# Patient Record
Sex: Female | Born: 1950 | Race: White | Hispanic: No | Marital: Married | Smoking: Never smoker
Health system: Southern US, Community
[De-identification: ages and names within clinical notes are randomized; demographics above are authoritative.]

## PROBLEM LIST (undated history)

## (undated) DIAGNOSIS — Z8371 Family history of colonic polyps: Secondary | ICD-10-CM

## (undated) DIAGNOSIS — Z800 Family history of malignant neoplasm of digestive organs: Secondary | ICD-10-CM

## (undated) DIAGNOSIS — I10 Essential (primary) hypertension: Secondary | ICD-10-CM

## (undated) DIAGNOSIS — R112 Nausea with vomiting, unspecified: Secondary | ICD-10-CM

## (undated) DIAGNOSIS — Z83719 Family history of colon polyps, unspecified: Secondary | ICD-10-CM

## (undated) DIAGNOSIS — M1990 Unspecified osteoarthritis, unspecified site: Secondary | ICD-10-CM

## (undated) DIAGNOSIS — Z87440 Personal history of urinary (tract) infections: Secondary | ICD-10-CM

## (undated) DIAGNOSIS — K589 Irritable bowel syndrome without diarrhea: Secondary | ICD-10-CM

## (undated) DIAGNOSIS — K5790 Diverticulosis of intestine, part unspecified, without perforation or abscess without bleeding: Secondary | ICD-10-CM

## (undated) DIAGNOSIS — Z9889 Other specified postprocedural states: Secondary | ICD-10-CM

## (undated) DIAGNOSIS — E7800 Pure hypercholesterolemia, unspecified: Secondary | ICD-10-CM

## (undated) DIAGNOSIS — R2232 Localized swelling, mass and lump, left upper limb: Secondary | ICD-10-CM

## (undated) HISTORY — PX: WEDGE RESECTION: SHX5070

## (undated) HISTORY — PX: FINGER GANGLION CYST EXCISION: SHX1636

## (undated) HISTORY — DX: Family history of colonic polyps: Z83.71

## (undated) HISTORY — DX: Family history of malignant neoplasm of digestive organs: Z80.0

## (undated) HISTORY — PX: APPENDECTOMY: SHX54

## (undated) HISTORY — DX: Pure hypercholesterolemia, unspecified: E78.00

## (undated) HISTORY — PX: CYST EXCISION: SHX5701

## (undated) HISTORY — PX: ABDOMINAL HYSTERECTOMY: SHX81

## (undated) HISTORY — PX: COLONOSCOPY: SHX174

## (undated) HISTORY — PX: TONSILLECTOMY: SUR1361

## (undated) HISTORY — DX: Family history of colon polyps, unspecified: Z83.719

## (undated) HISTORY — PX: OTHER SURGICAL HISTORY: SHX169

## (undated) HISTORY — PX: LIPOSUCTION: SHX10

## (undated) HISTORY — PX: WISDOM TOOTH EXTRACTION: SHX21

## (undated) SURGERY — EXCISION, LESION, HAND
Anesthesia: General | Laterality: Left

## (undated) SURGERY — BREAST REDUCTION WITH LIPOSUCTION
Anesthesia: General | Laterality: Bilateral

---

## 1998-11-24 ENCOUNTER — Ambulatory Visit (HOSPITAL_COMMUNITY): Admission: RE | Admit: 1998-11-24 | Discharge: 1998-11-24 | Payer: Self-pay | Admitting: Specialist

## 1998-12-06 ENCOUNTER — Ambulatory Visit (HOSPITAL_BASED_OUTPATIENT_CLINIC_OR_DEPARTMENT_OTHER): Admission: RE | Admit: 1998-12-06 | Discharge: 1998-12-06 | Payer: Self-pay | Admitting: Specialist

## 2000-05-27 ENCOUNTER — Other Ambulatory Visit: Admission: RE | Admit: 2000-05-27 | Discharge: 2000-05-27 | Payer: Self-pay | Admitting: Gynecology

## 2003-04-08 ENCOUNTER — Encounter: Payer: Self-pay | Admitting: Specialist

## 2003-04-08 ENCOUNTER — Encounter: Admission: RE | Admit: 2003-04-08 | Discharge: 2003-04-08 | Payer: Self-pay | Admitting: Specialist

## 2003-06-14 ENCOUNTER — Other Ambulatory Visit: Admission: RE | Admit: 2003-06-14 | Discharge: 2003-06-14 | Payer: Self-pay | Admitting: Gynecology

## 2008-05-28 ENCOUNTER — Ambulatory Visit (HOSPITAL_BASED_OUTPATIENT_CLINIC_OR_DEPARTMENT_OTHER): Admission: RE | Admit: 2008-05-28 | Discharge: 2008-05-28 | Payer: Self-pay | Admitting: Specialist

## 2008-05-28 HISTORY — PX: KNEE ARTHROSCOPY: SHX127

## 2008-08-05 ENCOUNTER — Ambulatory Visit: Payer: Self-pay | Admitting: Vascular Surgery

## 2008-08-05 ENCOUNTER — Ambulatory Visit (HOSPITAL_COMMUNITY): Admission: RE | Admit: 2008-08-05 | Discharge: 2008-08-05 | Payer: Self-pay | Admitting: Specialist

## 2008-08-05 ENCOUNTER — Encounter (INDEPENDENT_AMBULATORY_CARE_PROVIDER_SITE_OTHER): Payer: Self-pay | Admitting: Specialist

## 2011-01-30 NOTE — Op Note (Signed)
Laura Hahn, Laura Hahn              ACCOUNT NO.:  [id number]   MEDICAL RECORD NO.:  [id number]          PATIENT TYPE:  AMB   LOCATION:  [facility]                         FACILITY:  [facility]   PHYSICIAN:  Jene Every, M.D.    DATE OF BIRTH:  1951/09/13   DATE OF PROCEDURE:  05/28/2008  DATE OF DISCHARGE:                               OPERATIVE REPORT   PREOPERATIVE DIAGNOSIS:  Medial meniscus tear, degenerative joint  disease right knee.   POSTOPERATIVE DIAGNOSIS:  Medial meniscus tear, degenerative joint  disease right knee, lateral meniscus tear, grade III chondromalacia  medial femoral condyle, chondral flap tear, grade III chondromalacia of  patellar.   PROCEDURE:  Right knee arthroscopy, partial medial and lateral  meniscectomy, chondroplasty of patella, medial and femoral condyle,  tibial plateau.   ANESTHESIA:  General.   BRIEF HISTORY:  A 60 year old with locking, popping and giving way.  Medial meniscus tear was suspected on MRI and she is indicated for  arthroscopic surgery.  Risks and benefits were discussed including  bleeding, infection, no change in symptoms, worsening of symptoms, need  for repeat debridement, anesthetic complications, etc.   TECHNIQUE:  With the patient in supine position after induction of  adequate general anesthesia, 1 gram of Kefzol, the right lower extremity  was prepped and draped in the usual sterile fashion.  A lateral  parapatellar portal and superomedial parapatellar portal was fashioned  with a #11 blade.  Ingress cannula atraumatically placed.  Irrigant was  utilized to insufflate the joint.  Under direct visualization a medial  parapatellar portal was fashioned with a #11 blade after localization  with an 18 gauge needle sparing the medial meniscus.  Noted was a  chondral flap tear and extensive grade III changes of the femoral  condyle and a posterior horn medial meniscus tear and some grade III  change of the tibial plateau.  Basket  rongeurs was introduced to utilize  a partial medial meniscectomy of the posterior third to a stable base  and further contoured with a 4.2 Cuda shaver.  Approximately a third of  the inner portion of the meniscus has been resected.  Chondroplasty of  the femoral condyle and the tibial plateau were performed as well as  removing the chondral flap tear.  ACL and PCL were unremarkable.  Lateral compartment revealed some degenerative radial tearing of the  meniscus laterally and this was shaved and resected.  The cartilage on  the femoral condyle and tibial plateau was relatively spared.  The  patellofemoral joint indicated grade III changes of the patella and  chondroplasty was performed here.  There was normal patellofemoral  tracking.  The sulcus was essentially unremarkable.   The medial and lateral gutter were unremarkable.  The knee was copiously  lavaged and reexamined in the medial compartment, lateral compartment.  The remnants were stable to probe palpation.  No further pathology  amenable to arthroscopic intervention.  Next all instrumentation was  removed.  Portals were closed with 4-0 nylon simple suture.  Quarter percent Marcaine with epinephrine was infiltrated in the joint  and the wound was dressed sterilely.  Awakened without difficulty and  transported to the recovery room in satisfactory condition.  The patient  tolerated the procedure.  No complication.  No assistant.      Jene Every, M.D.  Electronically Signed     JB/MEDQ  D:  05/28/2008  T:  05/29/2008  Job:  [id number]

## 2011-06-20 LAB — POCT I-STAT 4, (NA,K, GLUC, HGB,HCT)
Glucose, Bld: 104 — ABNORMAL HIGH
HCT: 42
Potassium: 4.1

## 2014-04-07 ENCOUNTER — Other Ambulatory Visit: Payer: Self-pay | Admitting: Orthopedic Surgery

## 2014-05-13 ENCOUNTER — Encounter (HOSPITAL_COMMUNITY): Payer: Self-pay | Admitting: Pharmacy Technician

## 2014-05-17 NOTE — Patient Instructions (Signed)
Laura Hahn  05/17/2014   Your procedure is scheduled on:  05/27/2014  Report to [facility]  Follow the Signs to [place] at   0530   am  Call this number if you have problems the morning of surgery: [phone]   Remember:   Do not eat food or drink liquids after midnight.   Take these medicines the morning of surgery with A SIP OF WATER:    Do not wear jewelry, make-up or nail polish.  Do not wear lotions, powders, or perfumes, deodorant.  Do not shave 48 hours prior to surgery.  Do not bring valuables to the hospital.  Contacts, dentures or bridgework may not be worn into surgery.  Leave suitcase in the car. After surgery it may be brought to your room.  For patients admitted to the hospital, checkout time is 11:00 AM the day of  discharge.  [place] - Preparing for Surgery Before surgery, you can play an important role.  Because skin is not sterile, your skin needs to be as free of germs as possible.  You can reduce the number of germs on your skin by washing with CHG (chlorahexidine gluconate) soap before surgery.  CHG is an antiseptic cleaner which kills germs and bonds with the skin to continue killing germs even after washing. Please DO NOT use if you have an allergy to CHG or antibacterial soaps.  If your skin becomes reddened/irritated stop using the CHG and inform your nurse when you arrive at Short Stay. Do not shave (including legs and underarms) for at least 48 hours prior to the first CHG shower.  You may shave your face/neck. Please follow these instructions carefully:  1.  Shower with CHG Soap the night before surgery and the  morning of Surgery.  2.  If you choose to wash your hair, wash your hair first as usual with your  normal  shampoo.  3.  After you shampoo, rinse your hair and body thoroughly to remove the  shampoo.                           4.  Use CHG as you would any other liquid soap.  You can apply chg directly  to the skin and wash                        Gently with a scrungie or clean washcloth.  5.  Apply the CHG Soap to your body ONLY FROM THE NECK DOWN.   Do not use on face/ open                           Wound or open sores. Avoid contact with eyes, ears mouth and genitals (private parts).                       Wash face,  Genitals (private parts) with your normal soap.             6.  Wash thoroughly, paying special attention to the area where your surgery  will be performed.  7.  Thoroughly rinse your body with warm water from the neck down.  8.  DO NOT shower/wash with your normal soap after using and rinsing off  the CHG Soap.  9.  Pat yourself dry with a clean towel.            10.  Wear clean pajamas.            11.  Place clean sheets on your bed the night of your first shower and do not  sleep with pets. Day of Surgery : Do not apply any lotions/deodorants the morning of surgery.  Please wear clean clothes to the hospital/surgery center.  FAILURE TO FOLLOW THESE INSTRUCTIONS MAY RESULT IN THE CANCELLATION OF YOUR SURGERY PATIENT SIGNATURE_________________________________  NURSE SIGNATURE__________________________________  ________________________________________________________________________  WHAT IS A BLOOD TRANSFUSION? Blood Transfusion Information  A transfusion is the replacement of blood or some of its parts. Blood is made up of multiple cells which provide different functions.  Red blood cells carry oxygen and are used for blood loss replacement.  White blood cells fight against infection.  Platelets control bleeding.  Plasma helps clot blood.  Other blood products are available for specialized needs, such as hemophilia or other clotting disorders. BEFORE THE TRANSFUSION  Who gives blood for transfusions?   Healthy volunteers who are fully evaluated to make sure their blood is safe. This is blood bank blood. Transfusion therapy is the safest it has ever been in the  practice of medicine. Before blood is taken from a donor, a complete history is taken to make sure that person has no history of diseases nor engages in risky social behavior (examples are intravenous drug use or sexual activity with multiple partners). The donor's travel history is screened to minimize risk of transmitting infections, such as malaria. The donated blood is tested for signs of infectious diseases, such as HIV and hepatitis. The blood is then tested to be sure it is compatible with you in order to minimize the chance of a transfusion reaction. If you or a relative donates blood, this is often done in anticipation of surgery and is not appropriate for emergency situations. It takes many days to process the donated blood. RISKS AND COMPLICATIONS Although transfusion therapy is very safe and saves many lives, the main dangers of transfusion include:   Getting an infectious disease.  Developing a transfusion reaction. This is an allergic reaction to something in the blood you were given. Every precaution is taken to prevent this. The decision to have a blood transfusion has been considered carefully by your caregiver before blood is given. Blood is not given unless the benefits outweigh the risks. AFTER THE TRANSFUSION  Right after receiving a blood transfusion, you will usually feel much better and more energetic. This is especially true if your red blood cells have gotten low (anemic). The transfusion raises the level of the red blood cells which carry oxygen, and this usually causes an energy increase.  The nurse administering the transfusion will monitor you carefully for complications. HOME CARE INSTRUCTIONS  No special instructions are needed after a transfusion. You may find your energy is better. Speak with your caregiver about any limitations on activity for underlying diseases you may have. SEEK MEDICAL CARE IF:   Your condition is not improving after your transfusion.  You  develop redness or irritation at the intravenous (IV) site. SEEK IMMEDIATE MEDICAL CARE IF:  Any of the following symptoms occur over the next 12 hours:  Shaking chills.  You have a temperature by mouth above 102 F (38.9 C), not controlled by medicine.  Chest, back, or muscle pain.  People around you feel you are not acting correctly or  are confused.  Shortness of breath or difficulty breathing.  Dizziness and fainting.  You get a rash or develop hives.  You have a decrease in urine output.  Your urine turns a dark color or changes to pink, red, or brown. Any of the following symptoms occur over the next 10 days:  You have a temperature by mouth above 102 F (38.9 C), not controlled by medicine.  Shortness of breath.  Weakness after normal activity.  The white part of the eye turns yellow (jaundice).  You have a decrease in the amount of urine or are urinating less often.  Your urine turns a dark color or changes to pink, red, or brown. Document Released: 08/31/2000 Document Revised: 11/26/2011 Document Reviewed: 04/19/2008 ExitCare Patient Information 2014 ExitCare, [place].  _______________________________________________________________________   Please read over the following fact sheets that you were given: MRSA Information, coughing and deep breathing exercises, leg exercises

## 2014-05-18 ENCOUNTER — Encounter (HOSPITAL_COMMUNITY): Payer: Self-pay

## 2014-05-18 ENCOUNTER — Encounter (HOSPITAL_COMMUNITY)
Admission: RE | Admit: 2014-05-18 | Discharge: 2014-05-18 | Disposition: A | Discharge status: Home / Self Care | Payer: BC Managed Care – PPO | Source: Ambulatory Visit | Attending: Specialist | Admitting: Specialist

## 2014-05-18 ENCOUNTER — Ambulatory Visit (HOSPITAL_COMMUNITY)
Admission: RE | Admit: 2014-05-18 | Discharge: 2014-05-18 | Disposition: A | Discharge status: Home / Self Care | Payer: BC Managed Care – PPO | Source: Ambulatory Visit | Attending: Orthopedic Surgery | Admitting: Orthopedic Surgery

## 2014-05-18 ENCOUNTER — Encounter (INDEPENDENT_AMBULATORY_CARE_PROVIDER_SITE_OTHER): Payer: Self-pay

## 2014-05-18 DIAGNOSIS — M25469 Effusion, unspecified knee: Secondary | ICD-10-CM | POA: Insufficient documentation

## 2014-05-18 DIAGNOSIS — Z01818 Encounter for other preprocedural examination: Secondary | ICD-10-CM | POA: Insufficient documentation

## 2014-05-18 DIAGNOSIS — I10 Essential (primary) hypertension: Secondary | ICD-10-CM | POA: Diagnosis not present

## 2014-05-18 HISTORY — DX: Unspecified osteoarthritis, unspecified site: M19.90

## 2014-05-18 HISTORY — DX: Diverticulosis of intestine, part unspecified, without perforation or abscess without bleeding: K57.90

## 2014-05-18 HISTORY — DX: Irritable bowel syndrome, unspecified: K58.9

## 2014-05-18 HISTORY — DX: Essential (primary) hypertension: I10

## 2014-05-18 LAB — BASIC METABOLIC PANEL
Anion gap: 15 (ref 5–15)
BUN: 15 mg/dL (ref 6–23)
CHLORIDE: 99 meq/L (ref 96–112)
CO2: 25 meq/L (ref 19–32)
Calcium: 10.1 mg/dL (ref 8.4–10.5)
Creatinine, Ser: 0.82 mg/dL (ref 0.50–1.10)
GFR calc Af Amer: 88 mL/min — ABNORMAL LOW (ref >90)
GFR calc non Af Amer: 76 mL/min — ABNORMAL LOW (ref >90)
Glucose, Bld: 110 mg/dL — ABNORMAL HIGH (ref 70–99)
Potassium: 4.1 mEq/L (ref 3.7–5.3)
Sodium: 139 mEq/L (ref 137–147)

## 2014-05-18 LAB — CBC
HCT: 45 % (ref 36.0–46.0)
HEMOGLOBIN: 15.6 g/dL — AB (ref 12.0–15.0)
MCH: 33.9 pg (ref 26.0–34.0)
MCHC: 34.7 g/dL (ref 30.0–36.0)
MCV: 97.8 fL (ref 78.0–100.0)
Platelets: 240 10*3/uL (ref 150–400)
RBC: 4.6 MIL/uL (ref 3.87–5.11)
RDW: 11.8 % (ref 11.5–15.5)
WBC: 6.9 10*3/uL (ref 4.0–10.5)

## 2014-05-18 LAB — URINALYSIS, ROUTINE W REFLEX MICROSCOPIC
BILIRUBIN URINE: NEGATIVE
Glucose, UA: NEGATIVE mg/dL
Hgb urine dipstick: NEGATIVE
KETONES UR: NEGATIVE mg/dL
Leukocytes, UA: NEGATIVE
NITRITE: NEGATIVE
Protein, ur: NEGATIVE mg/dL
SPECIFIC GRAVITY, URINE: 1.01 (ref 1.005–1.030)
UROBILINOGEN UA: 0.2 mg/dL (ref 0.0–1.0)
pH: 8 (ref 5.0–8.0)

## 2014-05-18 LAB — ABO/RH: ABO/RH(D): A POS

## 2014-05-18 LAB — SURGICAL PCR SCREEN
MRSA, PCR: NEGATIVE
Staphylococcus aureus: NEGATIVE

## 2014-05-18 LAB — PROTIME-INR
INR: 0.95 (ref 0.00–1.49)
Prothrombin Time: 12.7 seconds (ref 11.6–15.2)

## 2014-05-18 LAB — APTT: aPTT: 29 seconds (ref 24–37)

## 2014-05-19 ENCOUNTER — Ambulatory Visit: Payer: Self-pay | Admitting: Orthopedic Surgery

## 2014-05-19 NOTE — H&P (Signed)
Laura Hahn. Stanke DOB: 26-Jul-1951 Married / Language: English / Race: White Female  H&P date: 05/18/14  Chief complaint: Right knee pain  History of Present Illness The patient is a 63 year old female who comes in today for a preoperative history and physical. The patient is scheduled for a right total knee arthroplasty to be performed by Dr. Johnn Hai, MD at [facility] on 05/27/14. Laura Hahn follows up today for her knees- reports the last injections did not help as much as they have in the past and she had a difficult time on her trip to [place] due to pain and increased walking which she couldn't tolerate. She has continued to wear her braces, ice and elevate, take NSAIDs prn, Norco prn for sleep when severe. The pain is now starting to radiate into the thigh and calf. She is having trouble sleeping at night due to pain. She has tried to remain active on the bike and now her walking distance has been limited due to pain. She and her husband are getting ready to retire in a few years and she does not want to be limited in her ability to travel at that point. She feels ready to proceed with surgery at this time because her ADL's are limited due to pain. Her right knee is s/p scope in 2009 with grade 3 changes at the time. Her right knee is slightly more severe and has been going on longer. Dr. Tonita Cong and the patient mutually agreed to proceed with a total knee replacement. Risks and benefits of the procedure were discussed including stiffness, suboptimal range of motion, persistent pain, infection requiring removal of prosthesis and reinsertion, need for prophylactic antibiotics in the future, for example, dental procedures, possible need for manipulation, revision in the future and also anesthetic complications including DVT, PE, etc. We discussed the perioperative course, time in the hospital, postoperative recovery and the need for elevation to control swelling. We also discussed the predicted  range of motion and the probability that squatting and kneeling would be unobtainable in the future. In addition, postoperative anticoagulation was discussed. We have obtained preoperative medical clearance as necessary. Provided her illustrated handout and discussed it in detail. They will enroll in the total joint replacement educational forum at the hospital. Her [facility] pre-op appt is also today 9/1.  Allergies  No Known Drug Allergies09/14/2012  Family History Cerebrovascular Accident father and grandmother fathers side Diabetes Mellitus grandfather mothers side First Degree Relatives Hypertension mother and father Osteoarthritis mother and father Heart Disease grandmother mothers side and grandmother fathers side  Social History Tobacco use Former smoker. former smoker; smoke(d) less than 1/2 pack(s) per day Tobacco / smoke exposure no Pain Contract no Living situation live with spouse, one-level home, no steps to enter Marital status married Number of flights of stairs before winded greater than 5 Drug/Alcohol Rehab (Previously) no Exercise Exercises daily; does running / walking Illicit drug use no Drug/Alcohol Rehab (Currently) no Children 2 Alcohol use current drinker; drinks wine; 8-14 per week (2-3 glasses/day) Current work status working part time- Paediatric nurse Plans home with HHPT, husband as caregiver taking off work until [employer] living will, HPOA  Medication History Norco (5-325MG  Tablet, 1 (one) Oral TAKE 1 TABLET EVERY 6 TO 8 HOURS AS NEEDED FOR PAIN., Taken starting 03/31/2014) Active. Aleve (220MG  Capsule, 1 (one) Oral) Active. Mobic (7.5MG  Tablet, Oral) Active. Losartan Potassium (25MG  Tablet, 1/2 tab QHS Oral) Active. Biotin Forte (5MG  Tablet, Oral) Active. (qd)  Glucosamine HCl (1500MG  Tablet, 1 (one) Oral) Active. Salmon Oil (Oral) Specific dose unknown - Active. Milk Thistle  (Oral) Specific dose unknown - Active. Aspirin (325MG  Tablet, 1 (one) Oral) Active. Estradiol (1MG  Tablet, Oral) Active. Triamterene-HCTZ (75-50MG  Tablet, Oral) Active. Multivitamin (Oral) Active. Medications Reconciled  Past Surgical History Appendectomy Arthroscopy of Knee right Hysterectomy partial (non-cancerous) Tonsillectomy  Past Medical Hx High blood pressure Diverticulosis Irritable bowel syndrome DJD  Review of Systems General Not Present- Chills, Fatigue, Fever, Memory Loss, Night Sweats, Weight Gain and Weight Loss. Skin Not Present- Eczema, Hives, Itching, Lesions and Rash. HEENT Not Present- Dentures, Double Vision, Headache, Hearing Loss, Tinnitus and Visual Loss. Respiratory Not Present- Allergies, Chronic Cough, Coughing up blood, Shortness of breath at rest and Shortness of breath with exertion. Cardiovascular Not Present- Chest Pain, Difficulty Breathing Lying Down, Murmur, Palpitations, Racing/skipping heartbeats and Swelling. Gastrointestinal Not Present- Abdominal Pain, Bloody Stool, Constipation, Diarrhea, Difficulty Swallowing, Heartburn, Jaundice, Loss of appetitie, Nausea and Vomiting. Female Genitourinary Not Present- Blood in Urine, Discharge, Flank Pain, Incontinence, Painful Urination, Urgency, Urinary frequency, Urinary Retention, Urinating at Night and Weak urinary stream. Musculoskeletal Present- Joint Pain, Joint Swelling and Morning Stiffness. Not Present- Back Pain, Muscle Pain, Muscle Weakness and Spasms. Neurological Not Present- Blackout spells, Difficulty with balance, Dizziness, Paralysis, Tremor and Weakness. Psychiatric Not Present- Insomnia.  Physical Exam General Mental Status -Alert, cooperative and good historian. General Appearance-pleasant, Not in acute distress. Orientation-Oriented X3. Build & Nutrition-Well nourished and Well developed. Gait-Stiff and Antalgic.  Head and Neck Head-normocephalic,  atraumatic . Neck Global Assessment - supple, no bruit auscultated on the right, no bruit auscultated on the left.  Eye Pupil - Bilateral-Regular and Round. Motion - Bilateral-EOMI.  Chest and Lung Exam Auscultation Breath sounds - clear at anterior chest wall and clear at posterior chest wall. Adventitious sounds - No Adventitious sounds.  Cardiovascular Auscultation Rhythm - Regular rate and rhythm. Heart Sounds - S1 WNL and S2 WNL. Murmurs & Other Heart Sounds - Auscultation of the heart reveals - No Murmurs.  Abdomen Palpation/Percussion Tenderness - Abdomen is non-tender to palpation. Rigidity (guarding) - Abdomen is soft. Auscultation Auscultation of the abdomen reveals - Bowel sounds normal.  Female Genitourinary Not done, not pertinent to present illness  Musculoskeletal Note: Right Knee: Inspection and Palpation - Tenderness - medial joint line tender to palpation and lateral joint line tender to palpation, no tenderness to palpation of the superior calf, no tenderness to palpation of the pes anserine bursa, no tenderness to palpation of the quadriceps tendon, no tenderness to palpation of the patellar tendon, no tenderness to palpation of the patella, no tenderness to palpation of the fibular head, no tenderness to palpation of the peroneal nerve. Patellar Tendon - no pain to palpation of the patellar tendon. Swelling - periarticular swelling present. Effusion - trace. Tissue tension/texture is - soft. Crepitus - moderate patellofemoral crepitus. Pulses - 2+. Sensation - intact to light touch. Skin - Color - no ecchymosis, no erythema. Strength and Tone - Quadriceps - 5/5. Hamstrings - 5/5. ROM: Flexion - AROM - 110 . Extension - AROM - 0 . Stability - Valgus Laxity at 30 - None. Valgus Laxity at 0 - None. Varus Laxity at 30 - None. Varus Laxity at 0 - None. Lachman - Negative. Anterior Drawer Test - Negative. Posterior Drawer Test - Negative. Right Knee -  Deformities/Malalignments/Discrepancies - no deformities noted. Special Tests - McMurray Test (lateral) - negative. McMurray Test (medial) - negative. Patellar Compression Pain - mild  pain.  Imaging Xrays reviewed from 5/15 with severe medial and PF arthrosis bilaterally, end-stage. Mild varus, right worse than left.  Assessment & Plan Primary osteoarthritis of right knee   Pt with end-stage DJD bilateral knees, right more symptomatic then left, refractory to conservative tx including steroid injections, bracing, activity modification, quad strengthening, HEP, relative rest, scheduled for right total knee replacement by Dr. Tonita Cong on 05/27/14. We again discussed the procedure itself as well as risks, complications and alternatives, including but not limited to DVT, PE, infx, bleeding, failure of procedure, need for secondary procedure including manipulation, nerve injury, ongoing pain/symptoms, anesthesia risk, even stroke or death. Also discussed typical post-op protocols, activity restrictions, need for PT, flexion/extension exercises, time out of work. Discussed need for DVT ppx post-op with Xarelto then ASA per protocol. Discussed dental ppx. Also discussed limitations post-operatively such as kneeling and squatting. All questions were answered. Patient desires to proceed with surgery as scheduled. Will continue to hold ASA, supplements, and NSAIDs accordingly. Will remain NPO after MN night before surgery. Will present to [facility] for pre-op testing today. Plan Xarelto 2 weeks post-op for DVT ppx then ASA. Plan Norco 7.5 or 10mg  on D/C as she is taking 5mg  prn pre-op, Robaxin, Colace (has at home due to GI hx). Plan home with HHPT post-op with husband at home for assistance. Will follow up 10-14 days post-op for suture removal and xrays. She will call with any questions or concerns in the interim.  Plan R total knee replacement  Signed electronically by Lacie Draft, PA-C for Dr. Tonita Cong

## 2014-05-26 NOTE — Anesthesia Preprocedure Evaluation (Addendum)
Anesthesia Evaluation  Patient identified by MRN, date of birth, ID band Patient awake    Reviewed: Allergy & Precautions, H&P , NPO status , Patient's Chart, lab work & pertinent test results  Airway Mallampati: II TM Distance: >3 FB Neck ROM: full    Dental no notable dental hx. (+) Teeth Intact, Dental Advisory Given   Pulmonary neg pulmonary ROS,  breath sounds clear to auscultation  Pulmonary exam normal       Cardiovascular Exercise Tolerance: Good hypertension, Pt. on medications negative cardio ROS  Rhythm:regular Rate:Normal     Neuro/Psych negative neurological ROS  negative psych ROS   GI/Hepatic negative GI ROS, Neg liver ROS,   Endo/Other  negative endocrine ROS  Renal/GU negative Renal ROS  negative genitourinary   Musculoskeletal   Abdominal   Peds  Hematology negative hematology ROS (+)   Anesthesia Other Findings   Reproductive/Obstetrics negative OB ROS                        Anesthesia Physical Anesthesia Plan  ASA: II  Anesthesia Plan: General   Post-op Pain Management:    Induction: Intravenous  Airway Management Planned: Oral ETT  Additional Equipment:   Intra-op Plan:   Post-operative Plan:   Informed Consent: I have reviewed the patients History and Physical, chart, labs and discussed the procedure including the risks, benefits and alternatives for the proposed anesthesia with the patient or authorized representative who has indicated his/her understanding and acceptance.   Dental Advisory Given  Plan Discussed with: CRNA and Surgeon  Anesthesia Plan Comments:         Anesthesia Quick Evaluation

## 2014-05-27 ENCOUNTER — Inpatient Hospital Stay (HOSPITAL_COMMUNITY)
Admission: RE | Admit: 2014-05-27 | Discharge: 2014-05-31 | DRG: 470 | Disposition: A | Discharge status: Home Health | Payer: BC Managed Care – PPO | Source: Ambulatory Visit | Attending: Specialist | Admitting: Specialist

## 2014-05-27 ENCOUNTER — Inpatient Hospital Stay (HOSPITAL_COMMUNITY): Payer: BC Managed Care – PPO | Admitting: Anesthesiology

## 2014-05-27 ENCOUNTER — Encounter (HOSPITAL_COMMUNITY)
Admission: RE | Disposition: A | Discharge status: Home Health | Payer: Self-pay | Source: Ambulatory Visit | Attending: Specialist

## 2014-05-27 ENCOUNTER — Encounter (HOSPITAL_COMMUNITY): Payer: Self-pay | Admitting: *Deleted

## 2014-05-27 ENCOUNTER — Inpatient Hospital Stay (HOSPITAL_COMMUNITY): Payer: BC Managed Care – PPO

## 2014-05-27 ENCOUNTER — Encounter (HOSPITAL_COMMUNITY): Payer: BC Managed Care – PPO | Admitting: Anesthesiology

## 2014-05-27 DIAGNOSIS — Z6826 Body mass index (BMI) 26.0-26.9, adult: Secondary | ICD-10-CM

## 2014-05-27 DIAGNOSIS — I10 Essential (primary) hypertension: Secondary | ICD-10-CM | POA: Diagnosis present

## 2014-05-27 DIAGNOSIS — M1710 Unilateral primary osteoarthritis, unspecified knee: Secondary | ICD-10-CM | POA: Diagnosis present

## 2014-05-27 DIAGNOSIS — E871 Hypo-osmolality and hyponatremia: Secondary | ICD-10-CM | POA: Diagnosis not present

## 2014-05-27 DIAGNOSIS — Z7982 Long term (current) use of aspirin: Secondary | ICD-10-CM | POA: Diagnosis not present

## 2014-05-27 DIAGNOSIS — K5909 Other constipation: Secondary | ICD-10-CM | POA: Diagnosis not present

## 2014-05-27 DIAGNOSIS — E876 Hypokalemia: Secondary | ICD-10-CM

## 2014-05-27 DIAGNOSIS — M25569 Pain in unspecified knee: Secondary | ICD-10-CM | POA: Diagnosis present

## 2014-05-27 DIAGNOSIS — M1711 Unilateral primary osteoarthritis, right knee: Secondary | ICD-10-CM

## 2014-05-27 DIAGNOSIS — R1032 Left lower quadrant pain: Secondary | ICD-10-CM

## 2014-05-27 HISTORY — PX: TOTAL KNEE ARTHROPLASTY: SHX125

## 2014-05-27 LAB — TYPE AND SCREEN
ABO/RH(D): A POS
ANTIBODY SCREEN: NEGATIVE

## 2014-05-27 SURGERY — ARTHROPLASTY, KNEE, TOTAL
Anesthesia: General | Site: Knee | Laterality: Right

## 2014-05-27 MED ORDER — BUPIVACAINE LIPOSOME 1.3 % IJ SUSP
INTRAMUSCULAR | Status: DC | PRN
Start: 2014-05-27 — End: 2014-05-27
  Administered 2014-05-27: 20 mL

## 2014-05-27 MED ORDER — NEOSTIGMINE METHYLSULFATE 10 MG/10ML IV SOLN
INTRAVENOUS | Status: DC | PRN
  Administered 2014-05-27: 4 mg via INTRAVENOUS

## 2014-05-27 MED ORDER — ROCURONIUM BROMIDE 100 MG/10ML IV SOLN
INTRAVENOUS | Status: AC
  Filled 2014-05-27: qty 1

## 2014-05-27 MED ORDER — ONDANSETRON HCL 4 MG/2ML IJ SOLN
4.0000 mg | Freq: Four times a day (QID) | INTRAMUSCULAR | Status: DC | PRN
Start: 2014-05-27 — End: 2014-05-31
  Administered 2014-05-27 – 2014-05-29 (×4): 4 mg via INTRAVENOUS
  Filled 2014-05-27 (×4): qty 2

## 2014-05-27 MED ORDER — ONDANSETRON HCL 4 MG/2ML IJ SOLN
INTRAMUSCULAR | Status: DC | PRN
  Administered 2014-05-27: 4 mg via INTRAVENOUS

## 2014-05-27 MED ORDER — MENTHOL 3 MG MT LOZG: 1.0000 | LOZENGE | OROMUCOSAL | Status: DC | PRN

## 2014-05-27 MED ORDER — EST ESTROGENS-METHYLTEST 1.25-2.5 MG PO TABS
1.0000 | ORAL_TABLET | Freq: Every morning | ORAL | Status: DC
  Filled 2014-05-27 (×2): qty 1

## 2014-05-27 MED ORDER — FENTANYL CITRATE 0.05 MG/ML IJ SOLN
INTRAMUSCULAR | Status: AC
  Filled 2014-05-27: qty 5

## 2014-05-27 MED ORDER — PHENOL 1.4 % MT LIQD: 1.0000 | OROMUCOSAL | Status: DC | PRN

## 2014-05-27 MED ORDER — ACETAMINOPHEN 325 MG PO TABS
650.0000 mg | ORAL_TABLET | Freq: Four times a day (QID) | ORAL | Status: DC | PRN
  Administered 2014-05-28 – 2014-05-30 (×2): 650 mg via ORAL
  Filled 2014-05-27 (×2): qty 2

## 2014-05-27 MED ORDER — RIVAROXABAN 10 MG PO TABS: 10.0000 mg | ORAL_TABLET | Freq: Every day | ORAL | Status: DC

## 2014-05-27 MED ORDER — CEFAZOLIN SODIUM-DEXTROSE 2-3 GM-% IV SOLR
INTRAVENOUS | Status: AC
  Filled 2014-05-27: qty 50

## 2014-05-27 MED ORDER — HYDROMORPHONE HCL PF 1 MG/ML IJ SOLN
0.2500 mg | INTRAMUSCULAR | Status: DC | PRN
  Administered 2014-05-27 (×2): 0.5 mg via INTRAVENOUS

## 2014-05-27 MED ORDER — METOCLOPRAMIDE HCL 5 MG PO TABS
5.0000 mg | ORAL_TABLET | Freq: Three times a day (TID) | ORAL | Status: DC | PRN
  Administered 2014-05-29: 5 mg via ORAL
  Filled 2014-05-27: qty 2

## 2014-05-27 MED ORDER — LIDOCAINE HCL (CARDIAC) 20 MG/ML IV SOLN
INTRAVENOUS | Status: DC | PRN
  Administered 2014-05-27: 100 mg via INTRAVENOUS

## 2014-05-27 MED ORDER — ACETAMINOPHEN 10 MG/ML IV SOLN
1000.0000 mg | Freq: Once | INTRAVENOUS | Status: AC
  Administered 2014-05-27: 1000 mg via INTRAVENOUS
  Filled 2014-05-27: qty 100

## 2014-05-27 MED ORDER — DIPHENHYDRAMINE HCL 12.5 MG/5ML PO ELIX: 12.5000 mg | ORAL_SOLUTION | ORAL | Status: DC | PRN

## 2014-05-27 MED ORDER — SODIUM CHLORIDE 0.9 % IR SOLN
Status: AC
  Filled 2014-05-27: qty 1

## 2014-05-27 MED ORDER — LACTATED RINGERS IV SOLN
INTRAVENOUS | Status: DC
  Administered 2014-05-27: 07:00:00 via INTRAVENOUS

## 2014-05-27 MED ORDER — HYDROMORPHONE HCL PF 1 MG/ML IJ SOLN
INTRAMUSCULAR | Status: DC | PRN
  Administered 2014-05-27 (×4): 0.5 mg via INTRAVENOUS

## 2014-05-27 MED ORDER — ROCURONIUM BROMIDE 100 MG/10ML IV SOLN
INTRAVENOUS | Status: DC | PRN
  Administered 2014-05-27: 30 mg via INTRAVENOUS

## 2014-05-27 MED ORDER — LACTATED RINGERS IV SOLN: INTRAVENOUS | Status: DC

## 2014-05-27 MED ORDER — DOCUSATE SODIUM 100 MG PO CAPS
100.0000 mg | ORAL_CAPSULE | Freq: Two times a day (BID) | ORAL | Status: DC
  Administered 2014-05-27 – 2014-05-31 (×8): 100 mg via ORAL

## 2014-05-27 MED ORDER — OXYCODONE HCL 5 MG PO TABS
5.0000 mg | ORAL_TABLET | ORAL | Status: DC | PRN
  Administered 2014-05-27: 10 mg via ORAL
  Administered 2014-05-27: 5 mg via ORAL
  Administered 2014-05-27 – 2014-05-28 (×3): 10 mg via ORAL
  Filled 2014-05-27: qty 2
  Filled 2014-05-27: qty 1
  Filled 2014-05-27 (×3): qty 2

## 2014-05-27 MED ORDER — TRIAMTERENE-HCTZ 75-50 MG PO TABS
1.0000 | ORAL_TABLET | Freq: Every morning | ORAL | Status: DC
  Administered 2014-05-27 – 2014-05-29 (×3): 1 via ORAL
  Filled 2014-05-27 (×3): qty 1

## 2014-05-27 MED ORDER — NEOSTIGMINE METHYLSULFATE 10 MG/10ML IV SOLN
INTRAVENOUS | Status: AC
  Filled 2014-05-27: qty 1

## 2014-05-27 MED ORDER — MIDAZOLAM HCL 5 MG/5ML IJ SOLN
INTRAMUSCULAR | Status: DC | PRN
  Administered 2014-05-27: 2 mg via INTRAVENOUS

## 2014-05-27 MED ORDER — METHOCARBAMOL 1000 MG/10ML IJ SOLN
500.0000 mg | Freq: Four times a day (QID) | INTRAVENOUS | Status: DC | PRN
  Administered 2014-05-27 – 2014-05-30 (×2): 500 mg via INTRAVENOUS
  Filled 2014-05-27: qty 5

## 2014-05-27 MED ORDER — DEXAMETHASONE SODIUM PHOSPHATE 10 MG/ML IJ SOLN
INTRAMUSCULAR | Status: AC
  Filled 2014-05-27: qty 1

## 2014-05-27 MED ORDER — ACETAMINOPHEN 650 MG RE SUPP: 650.0000 mg | Freq: Four times a day (QID) | RECTAL | Status: DC | PRN

## 2014-05-27 MED ORDER — GLYCOPYRROLATE 0.2 MG/ML IJ SOLN
INTRAMUSCULAR | Status: DC | PRN
  Administered 2014-05-27: 0.6 mg via INTRAVENOUS

## 2014-05-27 MED ORDER — FENTANYL CITRATE 0.05 MG/ML IJ SOLN
INTRAMUSCULAR | Status: AC
  Filled 2014-05-27: qty 2

## 2014-05-27 MED ORDER — BISACODYL 5 MG PO TBEC
5.0000 mg | DELAYED_RELEASE_TABLET | Freq: Every day | ORAL | Status: DC | PRN
  Filled 2014-05-27 (×3): qty 1

## 2014-05-27 MED ORDER — METHOCARBAMOL 500 MG PO TABS
500.0000 mg | ORAL_TABLET | Freq: Four times a day (QID) | ORAL | Status: DC | PRN
  Administered 2014-05-27 – 2014-05-31 (×10): 500 mg via ORAL
  Filled 2014-05-27 (×11): qty 1

## 2014-05-27 MED ORDER — PROPOFOL 10 MG/ML IV BOLUS
INTRAVENOUS | Status: AC
  Filled 2014-05-27: qty 20

## 2014-05-27 MED ORDER — ONDANSETRON HCL 4 MG/2ML IJ SOLN
INTRAMUSCULAR | Status: AC
  Filled 2014-05-27: qty 2

## 2014-05-27 MED ORDER — RIVAROXABAN 10 MG PO TABS
10.0000 mg | ORAL_TABLET | Freq: Every day | ORAL | Status: DC
  Administered 2014-05-28 – 2014-05-31 (×4): 10 mg via ORAL
  Filled 2014-05-27 (×5): qty 1

## 2014-05-27 MED ORDER — SUCCINYLCHOLINE CHLORIDE 20 MG/ML IJ SOLN
INTRAMUSCULAR | Status: DC | PRN
  Administered 2014-05-27: 100 mg via INTRAVENOUS

## 2014-05-27 MED ORDER — METOCLOPRAMIDE HCL 5 MG/ML IJ SOLN
5.0000 mg | Freq: Three times a day (TID) | INTRAMUSCULAR | Status: DC | PRN
  Administered 2014-05-27 – 2014-05-28 (×3): 10 mg via INTRAVENOUS
  Administered 2014-05-29: 5 mg via INTRAVENOUS
  Filled 2014-05-27 (×3): qty 2

## 2014-05-27 MED ORDER — MAGNESIUM CITRATE PO SOLN: 1.0000 | Freq: Once | ORAL | Status: AC | PRN

## 2014-05-27 MED ORDER — HYDROMORPHONE HCL PF 1 MG/ML IJ SOLN
INTRAMUSCULAR | Status: AC
  Filled 2014-05-27: qty 1

## 2014-05-27 MED ORDER — METHOCARBAMOL 500 MG PO TABS: 500.0000 mg | ORAL_TABLET | Freq: Three times a day (TID) | ORAL | Status: DC | PRN

## 2014-05-27 MED ORDER — DOCUSATE SODIUM 100 MG PO CAPS: 100.0000 mg | ORAL_CAPSULE | Freq: Two times a day (BID) | ORAL | Status: DC | PRN

## 2014-05-27 MED ORDER — BUPIVACAINE-EPINEPHRINE (PF) 0.25% -1:200000 IJ SOLN
INTRAMUSCULAR | Status: AC
  Filled 2014-05-27: qty 30

## 2014-05-27 MED ORDER — HYDROMORPHONE HCL PF 2 MG/ML IJ SOLN
INTRAMUSCULAR | Status: AC
  Filled 2014-05-27: qty 1

## 2014-05-27 MED ORDER — EPHEDRINE SULFATE 50 MG/ML IJ SOLN
INTRAMUSCULAR | Status: AC
  Filled 2014-05-27: qty 1

## 2014-05-27 MED ORDER — STERILE WATER FOR IRRIGATION IR SOLN
Status: DC | PRN
  Administered 2014-05-27: 1500 mL

## 2014-05-27 MED ORDER — METOCLOPRAMIDE HCL 5 MG/ML IJ SOLN
INTRAMUSCULAR | Status: AC
  Filled 2014-05-27: qty 2

## 2014-05-27 MED ORDER — CEFAZOLIN SODIUM-DEXTROSE 2-3 GM-% IV SOLR
2.0000 g | Freq: Four times a day (QID) | INTRAVENOUS | Status: AC
  Administered 2014-05-27 – 2014-05-28 (×3): 2 g via INTRAVENOUS
  Filled 2014-05-27 (×3): qty 50

## 2014-05-27 MED ORDER — ATROPINE SULFATE 0.4 MG/ML IJ SOLN
INTRAMUSCULAR | Status: AC
  Filled 2014-05-27: qty 2

## 2014-05-27 MED ORDER — FENTANYL CITRATE 0.05 MG/ML IJ SOLN
INTRAMUSCULAR | Status: DC | PRN
  Administered 2014-05-27 (×3): 50 ug via INTRAVENOUS
  Administered 2014-05-27 (×2): 100 ug via INTRAVENOUS

## 2014-05-27 MED ORDER — CEFAZOLIN SODIUM-DEXTROSE 2-3 GM-% IV SOLR
2.0000 g | INTRAVENOUS | Status: AC
  Administered 2014-05-27: 2 g via INTRAVENOUS

## 2014-05-27 MED ORDER — SODIUM CHLORIDE 0.45 % IV SOLN
INTRAVENOUS | Status: DC
  Administered 2014-05-27 – 2014-05-28 (×2): via INTRAVENOUS
  Filled 2014-05-27 (×2): qty 1000

## 2014-05-27 MED ORDER — PROPOFOL 10 MG/ML IV BOLUS
INTRAVENOUS | Status: DC | PRN
  Administered 2014-05-27: 100 mg via INTRAVENOUS

## 2014-05-27 MED ORDER — HYDROMORPHONE HCL PF 1 MG/ML IJ SOLN
1.0000 mg | INTRAMUSCULAR | Status: DC | PRN
  Administered 2014-05-27 – 2014-05-30 (×10): 1 mg via INTRAVENOUS
  Filled 2014-05-27 (×11): qty 1

## 2014-05-27 MED ORDER — SODIUM CHLORIDE 0.9 % IJ SOLN
INTRAMUSCULAR | Status: AC
  Filled 2014-05-27: qty 10

## 2014-05-27 MED ORDER — LIDOCAINE HCL (CARDIAC) 20 MG/ML IV SOLN
INTRAVENOUS | Status: AC
  Filled 2014-05-27: qty 5

## 2014-05-27 MED ORDER — EST ESTROGENS-METHYLTEST 0.625-1.25 MG PO TABS
2.0000 | ORAL_TABLET | Freq: Every morning | ORAL | Status: DC
  Administered 2014-05-27 – 2014-05-31 (×4): 2 via ORAL
  Filled 2014-05-27 (×7): qty 2

## 2014-05-27 MED ORDER — DEXAMETHASONE SODIUM PHOSPHATE 10 MG/ML IJ SOLN
INTRAMUSCULAR | Status: DC | PRN
  Administered 2014-05-27: 10 mg via INTRAVENOUS

## 2014-05-27 MED ORDER — SODIUM CHLORIDE 0.9 % IR SOLN
Status: DC | PRN
  Administered 2014-05-27: 1000 mL

## 2014-05-27 MED ORDER — MEPERIDINE HCL 50 MG/ML IJ SOLN
6.2500 mg | INTRAMUSCULAR | Status: DC | PRN
  Administered 2014-05-27 (×2): 12.5 mg via INTRAVENOUS

## 2014-05-27 MED ORDER — GLYCOPYRROLATE 0.2 MG/ML IJ SOLN
INTRAMUSCULAR | Status: AC
  Filled 2014-05-27: qty 3

## 2014-05-27 MED ORDER — BUPIVACAINE LIPOSOME 1.3 % IJ SUSP
20.0000 mL | Freq: Once | INTRAMUSCULAR | Status: DC
  Filled 2014-05-27: qty 20

## 2014-05-27 MED ORDER — MIDAZOLAM HCL 2 MG/2ML IJ SOLN
INTRAMUSCULAR | Status: AC
  Filled 2014-05-27: qty 2

## 2014-05-27 MED ORDER — EPHEDRINE SULFATE 50 MG/ML IJ SOLN
INTRAMUSCULAR | Status: DC | PRN
  Administered 2014-05-27: 5 mg via INTRAVENOUS

## 2014-05-27 MED ORDER — ALUM & MAG HYDROXIDE-SIMETH 200-200-20 MG/5ML PO SUSP
30.0000 mL | ORAL | Status: DC | PRN
  Administered 2014-05-30: 30 mL via ORAL
  Filled 2014-05-27: qty 30

## 2014-05-27 MED ORDER — BUPIVACAINE-EPINEPHRINE (PF) 0.25% -1:200000 IJ SOLN
INTRAMUSCULAR | Status: DC | PRN
  Administered 2014-05-27: 20 mL

## 2014-05-27 MED ORDER — MEPERIDINE HCL 50 MG/ML IJ SOLN
INTRAMUSCULAR | Status: AC
  Filled 2014-05-27: qty 1

## 2014-05-27 MED ORDER — POLYMYXIN B SULFATE 500000 UNITS IJ SOLR
INTRAMUSCULAR | Status: DC | PRN
  Administered 2014-05-27: 08:00:00

## 2014-05-27 MED ORDER — ONDANSETRON HCL 4 MG PO TABS: 4.0000 mg | ORAL_TABLET | Freq: Four times a day (QID) | ORAL | Status: DC | PRN

## 2014-05-27 MED ORDER — SENNOSIDES-DOCUSATE SODIUM 8.6-50 MG PO TABS: 1.0000 | ORAL_TABLET | Freq: Every evening | ORAL | Status: DC | PRN

## 2014-05-27 MED ORDER — OXYCODONE-ACETAMINOPHEN 7.5-325 MG PO TABS: 1.0000 | ORAL_TABLET | ORAL | Status: DC | PRN

## 2014-05-27 MED ORDER — LOSARTAN POTASSIUM 50 MG PO TABS
50.0000 mg | ORAL_TABLET | Freq: Every evening | ORAL | Status: DC
  Administered 2014-05-28 – 2014-05-30 (×3): 50 mg via ORAL
  Filled 2014-05-27 (×4): qty 1

## 2014-05-27 SURGICAL SUPPLY — 72 items
BAG ZIPLOCK 12X15 (MISCELLANEOUS) IMPLANT
BANDAGE ELASTIC 4 VELCRO ST LF (GAUZE/BANDAGES/DRESSINGS) ×2 IMPLANT
BANDAGE ELASTIC 6 VELCRO ST LF (GAUZE/BANDAGES/DRESSINGS) ×2 IMPLANT
BANDAGE ESMARK 6X9 LF (GAUZE/BANDAGES/DRESSINGS) ×1 IMPLANT
BLADE SAG 18X100X1.27 (BLADE) ×2 IMPLANT
BLADE SAW SGTL 13.0X1.19X90.0M (BLADE) ×2 IMPLANT
BNDG ESMARK 6X9 LF (GAUZE/BANDAGES/DRESSINGS) ×2
CAPT RP KNEE ×2 IMPLANT
CEMENT HV SMART SET (Cement) ×2 IMPLANT
CHLORAPREP W/TINT 26ML (MISCELLANEOUS) IMPLANT
CLOTH 2% CHLOROHEXIDINE 3PK (PERSONAL CARE ITEMS) ×2 IMPLANT
CUFF TOURN SGL QUICK 34 (TOURNIQUET CUFF) ×1
CUFF TRNQT CYL 34X4X40X1 (TOURNIQUET CUFF) ×1 IMPLANT
DRAPE INCISE IOBAN 66X45 STRL (DRAPES) IMPLANT
DRAPE LG THREE QUARTER DISP (DRAPES) ×2 IMPLANT
DRAPE ORTHO SPLIT 77X108 STRL (DRAPES) ×2
DRAPE POUCH INSTRU U-SHP 10X18 (DRAPES) ×2 IMPLANT
DRAPE SURG ORHT 6 SPLT 77X108 (DRAPES) ×2 IMPLANT
DRAPE U-SHAPE 47X51 STRL (DRAPES) ×2 IMPLANT
DRSG ADAPTIC 3X8 NADH LF (GAUZE/BANDAGES/DRESSINGS) IMPLANT
DRSG AQUACEL AG ADV 3.5X10 (GAUZE/BANDAGES/DRESSINGS) ×2 IMPLANT
DRSG AQUACEL AG ADV 3.5X14 (GAUZE/BANDAGES/DRESSINGS) ×2 IMPLANT
DRSG PAD ABDOMINAL 8X10 ST (GAUZE/BANDAGES/DRESSINGS) IMPLANT
DRSG TEGADERM 4X4.75 (GAUZE/BANDAGES/DRESSINGS) ×2 IMPLANT
DURAPREP 26ML APPLICATOR (WOUND CARE) ×2 IMPLANT
ELECT REM PT RETURN 9FT ADLT (ELECTROSURGICAL) ×2
ELECTRODE REM PT RTRN 9FT ADLT (ELECTROSURGICAL) ×1 IMPLANT
EVACUATOR 1/8 PVC DRAIN (DRAIN) ×2 IMPLANT
FACESHIELD WRAPAROUND (MASK) ×10 IMPLANT
GAUZE SPONGE 2X2 8PLY STRL LF (GAUZE/BANDAGES/DRESSINGS) ×1 IMPLANT
GAUZE SPONGE 4X4 12PLY STRL (GAUZE/BANDAGES/DRESSINGS) IMPLANT
GLOVE BIO SURGEON STRL SZ7.5 (GLOVE) ×2 IMPLANT
GLOVE BIO SURGEON STRL SZ8 (GLOVE) ×2 IMPLANT
GLOVE BIOGEL PI IND STRL 7.5 (GLOVE) ×2 IMPLANT
GLOVE BIOGEL PI IND STRL 8 (GLOVE) ×2 IMPLANT
GLOVE BIOGEL PI INDICATOR 7.5 (GLOVE) ×2
GLOVE BIOGEL PI INDICATOR 8 (GLOVE) ×2
GLOVE SURG SS PI 6.5 STRL IVOR (GLOVE) ×4 IMPLANT
GLOVE SURG SS PI 7.5 STRL IVOR (GLOVE) ×2 IMPLANT
GLOVE SURG SS PI 8.0 STRL IVOR (GLOVE) ×4 IMPLANT
GOWN STRL REUS W/TWL XL LVL3 (GOWN DISPOSABLE) ×8 IMPLANT
HANDPIECE INTERPULSE COAX TIP (DISPOSABLE) ×1
IMMOBILIZER KNEE 20 (SOFTGOODS) ×4 IMPLANT
IMMOBILIZER KNEE 20 THIGH 36 (SOFTGOODS) ×1 IMPLANT
KIT BASIN OR (CUSTOM PROCEDURE TRAY) ×2 IMPLANT
MANIFOLD NEPTUNE II (INSTRUMENTS) ×2 IMPLANT
NDL SAFETY ECLIPSE 18X1.5 (NEEDLE) ×1 IMPLANT
NEEDLE HYPO 18GX1.5 SHARP (NEEDLE) ×1
NS IRRIG 1000ML POUR BTL (IV SOLUTION) IMPLANT
PACK TOTAL JOINT (CUSTOM PROCEDURE TRAY) ×2 IMPLANT
PADDING CAST COTTON 6X4 STRL (CAST SUPPLIES) IMPLANT
POSITIONER SURGICAL ARM (MISCELLANEOUS) ×2 IMPLANT
SET HNDPC FAN SPRY TIP SCT (DISPOSABLE) ×1 IMPLANT
SPONGE GAUZE 2X2 STER 10/PKG (GAUZE/BANDAGES/DRESSINGS) ×1
SPONGE SURGIFOAM ABS GEL 100 (HEMOSTASIS) IMPLANT
STAPLER VISISTAT (STAPLE) IMPLANT
STRIP CLOSURE SKIN 1/2X4 (GAUZE/BANDAGES/DRESSINGS) ×2 IMPLANT
SUCTION FRAZIER 12FR DISP (SUCTIONS) ×2 IMPLANT
SUT BONE WAX W31G (SUTURE) IMPLANT
SUT MNCRL AB 4-0 PS2 18 (SUTURE) IMPLANT
SUT VIC AB 1 CT1 27 (SUTURE) ×2
SUT VIC AB 1 CT1 27XBRD ANTBC (SUTURE) ×2 IMPLANT
SUT VIC AB 2-0 CT1 27 (SUTURE) ×3
SUT VIC AB 2-0 CT1 TAPERPNT 27 (SUTURE) ×3 IMPLANT
SUT VLOC 180 0 24IN GS25 (SUTURE) ×2 IMPLANT
SYR 20CC LL (SYRINGE) ×2 IMPLANT
TOWEL OR 17X26 10 PK STRL BLUE (TOWEL DISPOSABLE) ×2 IMPLANT
TOWEL OR NON WOVEN STRL DISP B (DISPOSABLE) ×2 IMPLANT
TOWER CARTRIDGE SMART MIX (DISPOSABLE) ×2 IMPLANT
TRAY FOLEY CATH 14FRSI W/METER (CATHETERS) ×2 IMPLANT
WATER STERILE IRR 1500ML POUR (IV SOLUTION) ×2 IMPLANT
WRAP KNEE MAXI GEL POST OP (GAUZE/BANDAGES/DRESSINGS) ×2 IMPLANT

## 2014-05-27 NOTE — Brief Op Note (Signed)
05/27/2014  9:43 AM  PATIENT:  Laura Hahn  63 y.o. female  PRE-OPERATIVE DIAGNOSIS:  RIGHT KNEE DJD   POST-OPERATIVE DIAGNOSIS:  RIGHT KNEE DJD   PROCEDURE:  Procedure(s): TOTAL RIGHT KNEE ARTHROPLASTY (Right)  SURGEON:  Surgeon(s) and Role:    * Johnn Hai, MD - Primary  PHYSICIAN ASSISTANT:   ASSISTANTS: Bissell   ANESTHESIA:   general  EBL:  Total I/O In: 2000 [I.V.:2000] Out: 150 [Urine:100; Blood:50]  BLOOD ADMINISTERED:none  DRAINS: none   LOCAL MEDICATIONS USED:  MARCAINE     SPECIMEN:  No Specimen  DISPOSITION OF SPECIMEN:  N/A  COUNTS:  YES  TOURNIQUET:  * Missing tourniquet times found for documented tourniquets in log:  [id number] *  DICTATION: .Viviann Spare [phone]  PLAN OF CARE: Admit to inpatient   PATIENT DISPOSITION:  PACU - hemodynamically stable.   Delay start of Pharmacological VTE agent (>24hrs) due to surgical blood loss or risk of bleeding: no

## 2014-05-27 NOTE — Transfer of Care (Signed)
Immediate Anesthesia Transfer of Care Note  Patient: Laura Hahn  Procedure(s) Performed: Procedure(s): TOTAL RIGHT KNEE ARTHROPLASTY (Right)  Patient Location: PACU  Anesthesia Type:General  Level of Consciousness: awake, alert , oriented and patient cooperative  Airway & Oxygen Therapy: Patient Spontanous Breathing and Patient connected to face mask oxygen  Post-op Assessment: Report given to PACU RN, Post -op Vital signs reviewed and stable and Patient moving all extremities  Post vital signs: Reviewed and stable  Complications: No apparent anesthesia complications

## 2014-05-27 NOTE — Op Note (Signed)
Laura Hahn, Laura Hahn NO.:  [id number]  MEDICAL RECORD NO.:  [id number]  LOCATION:  [facility]                         FACILITY:  [facility]  PHYSICIAN:  Susa Day, M.D.    DATE OF BIRTH:  Jul 24, 1951  DATE OF PROCEDURE:  05/27/2014 DATE OF DISCHARGE:                              OPERATIVE REPORT   PREOPERATIVE DIAGNOSIS:  Degenerative joint disease, right knee, end- stage with varus deformity.  POSTOPERATIVE DIAGNOSIS:  Degenerative joint disease, right knee, end- stage with varus deformity.  PROCEDURE PERFORMED:  Right total knee arthroplasty.  ANESTHESIA:  General.  ASSISTANT:  Cleophas Dunker, PA.  COMPONENTS:  DePuy rotating platform, 4 femur, 4 tibia, 10 mm insert, 38 patella.  HISTORY:  A 63 year old female with end-stage osteoarthrosis, medial compartment, bone on bone, indicated for replacement, degenerated joint, failing conservative treatment, severe affect to her activities of daily living.  Risks and benefits were discussed including bleeding, infection, damage to neurovascular structures, DVT, PE, anesthetic complications, suboptimal range of motion, need for manipulation, etc.  TECHNIQUE:  With the patient in supine position, after induction of adequate general anesthesia, 2 g Kefzol, right lower extremity was prepped, draped, and exsanguinated in usual sterile fashion.  Thigh tourniquet was inflated to 300 mmHg.  A midline incision was made over the knee.  Full-thickness flaps were developed.  Medial and parapatellar arthrotomy performed.  Soft tissues elevated medially preserving the MCL.  Knee was flexed.  Tricompartmental osteoarthrosis, bone on bone was noted, particularly the medial compartment.  Remnants of the ACL and menisci were excised.  Rongeur was removed.  Synovectomy performed. Step drill was utilized to enter the femoral canal just above the notch. It was irrigated 5 degree right with 11 off the distal femur due to flexion  contracture.  A distal femoral cut was performed.  We sized off the anterior cortex.  The patient was 4-1/2 now.  We therefore pinned it at 4-1/2, checked anteriorly and posteriorly.  We then pinned in 3 degrees of external rotation, and then with our cutting block, we checked with a sizer guide and we felt that we could drop the cutting guide 2 mm.  Because it was 4-1/2, we would not be taking disproportionate amount of the femoral condyles.  Then, this was flushed with the anterior cortex.  We then made our anterior-posterior and chamfer cuts.  No notching occurred.  This was unremarkable.  We turned our attention towards the tibia.  We subluxed it, used Mikhail retractors to protect throughout the case.  We used an external alignment guide 4 off the defect, which was medial, parallel to the shaft, slight slope, bisecting the tibiotalar joint.  This was then pinned and we performed out cut.  We then used a spacer guide in the flexion and extension gaps.  We were tight in extension, fine in flexion.  We therefore felt we had to take more off the distal femur. We then re-pinned the distal femur and cut 2 mm off the distal femur and the chamfer cuts.  We then retried with a block and it was equivalent in flexion and extension.  We had, just prior to that, performed our notch cut as well with  a block guide applied to the distal femur bisecting the condyles, performed that cut, and then, we had used the trial femur and tibia with a 10 mm insert, it was tight in extension, fine in flexion. We therefore revised the end of the femur.  Again, 2 mm of additional femur was taken.  We then replaced the trial femur and the tibia and a 10 mm insert.  We had a satisfactory extension and flexion.  The tibia had also been repaired with the tibia subluxed.  We had maximized our surface area to a 4 distal medial aspect of the tibial tubercle.  It was then pinned.  We drilled essentially, used our punch  guide and that was our trial tibia, and again, we had full extension and good flexion, excellent stability of varus and valgus stressing at 0 and 30 degrees. Again, we turned our attention towards completing the patella, it was measured to a 24-38 sized, we planed 9.5 off the patella, was fairly hard bone.  We then measured it, residual was a 15, drilled our peg holes, medialized, and then placed the trial patella with excellent patellofemoral tracking.  All instrumentation was removed.  We checked posteriorly.  Remnants of the menisci were excised.  Popliteus was intact, cauterized the geniculates, copiously irrigated the wound with pulsatile lavage, flexed the knee, subluxed the tibia.  All surfaces were well dried.  We then mixed cement on the back table and injected into the canal digitally, pressurizing the canal.  Impacted the tibial tray, redundant cement removed.  We cemented the femur, redundant cement removed, put a 10 insert, reduced it and held an axial load throughout the curing of the cement.  We cemented the patella as well.  After curing of the cement, we had full extension and flexion and good stability with varus valgus stressing in 0 and 30 degrees.  Excellent patellofemoral tracking.  We therefore selected a permanent 10, removed our trial and meticulously removed all cement medially, laterally, and posteriorly and from the femur, that was redundant.  Copiously irrigated the wound with pulsatile lavage and antibiotic irrigation, subluxed the tibia, inserted a 10 mm tray, reduced it.  It had full extension, full flexion, good stability with varus and valgus stressing at 0 and 30 degrees.  Excellent patellofemoral tracking.  We placed a Hemovac and brought it out through a lateral stab wound of the skin.  Used a combination of Exparel and Marcaine, 30 mL of each, 0.25% Marcaine with epinephrine in the Exparel, infiltrated it in the knee and periarticular tissues.  In  slight flexion, we reapproximated the patellar arthrotomy with 1 Vicryl.  We then used a running V-Loc, subcu with 2-0, and skin with 4-0 subcuticular Prolene.  Had flexion to 90 against gravity, full flexion with stability of the closure noted.  Sterile dressing applied. Tourniquet was deflated, and there was adequate revascularization of lower extremity appreciated.  The patient tolerated the procedure well.  No complications.  Assistant Cleophas Dunker, [place] was used throughout the case for closure assisting and retraction and holding.  Tourniquet time was 1 hour and 40 minutes.  Minimal blood loss.     Susa Day, M.D.     Geralynn Rile  D:  05/27/2014  T:  05/27/2014  Job:  [id number]

## 2014-05-27 NOTE — H&P (View-Only) (Signed)
Laura Hahn. Laura Hahn DOB: 1950-11-03 Married / Language: English / Race: White Female  H&P date: 05/18/14  Chief complaint: Right knee pain  History of Present Illness The patient is a 63 year old female who comes in today for a preoperative history and physical. The patient is scheduled for a right total knee arthroplasty to be performed by Dr. Johnn Hai, MD at [facility] on 05/27/14. Laura Hahn follows up today for her knees- reports the last injections did not help as much as they have in the past and she had a difficult time on her trip to [place] due to pain and increased walking which she couldn't tolerate. She has continued to wear her braces, ice and elevate, take NSAIDs prn, Norco prn for sleep when severe. The pain is now starting to radiate into the thigh and calf. She is having trouble sleeping at night due to pain. She has tried to remain active on the bike and now her walking distance has been limited due to pain. She and her husband are getting ready to retire in a few years and she does not want to be limited in her ability to travel at that point. She feels ready to proceed with surgery at this time because her ADL's are limited due to pain. Her right knee is s/p scope in 2009 with grade 3 changes at the time. Her right knee is slightly more severe and has been going on longer. Dr. Tonita Cong and the patient mutually agreed to proceed with a total knee replacement. Risks and benefits of the procedure were discussed including stiffness, suboptimal range of motion, persistent pain, infection requiring removal of prosthesis and reinsertion, need for prophylactic antibiotics in the future, for example, dental procedures, possible need for manipulation, revision in the future and also anesthetic complications including DVT, PE, etc. We discussed the perioperative course, time in the hospital, postoperative recovery and the need for elevation to control swelling. We also discussed the predicted  range of motion and the probability that squatting and kneeling would be unobtainable in the future. In addition, postoperative anticoagulation was discussed. We have obtained preoperative medical clearance as necessary. Provided her illustrated handout and discussed it in detail. They will enroll in the total joint replacement educational forum at the hospital. Her [facility] pre-op appt is also today 9/1.  Allergies  No Known Drug Allergies09/14/2012  Family History Cerebrovascular Accident father and grandmother fathers side Diabetes Mellitus grandfather mothers side First Degree Relatives Hypertension mother and father Osteoarthritis mother and father Heart Disease grandmother mothers side and grandmother fathers side  Social History Tobacco use Former smoker. former smoker; smoke(d) less than 1/2 pack(s) per day Tobacco / smoke exposure no Pain Contract no Living situation live with spouse, one-level home, no steps to enter Marital status married Number of flights of stairs before winded greater than 5 Drug/Alcohol Rehab (Previously) no Exercise Exercises daily; does running / walking Illicit drug use no Drug/Alcohol Rehab (Currently) no Children 2 Alcohol use current drinker; drinks wine; 8-14 per week (2-3 glasses/day) Current work status working part time- Paediatric nurse Plans home with HHPT, husband as caregiver taking off work until [employer] living will, HPOA  Medication History Norco (5-325MG  Tablet, 1 (one) Oral TAKE 1 TABLET EVERY 6 TO 8 HOURS AS NEEDED FOR PAIN., Taken starting 03/31/2014) Active. Aleve (220MG  Capsule, 1 (one) Oral) Active. Mobic (7.5MG  Tablet, Oral) Active. Losartan Potassium (25MG  Tablet, 1/2 tab QHS Oral) Active. Biotin Forte (5MG  Tablet, Oral) Active. (qd)  Glucosamine HCl (1500MG  Tablet, 1 (one) Oral) Active. Salmon Oil (Oral) Specific dose unknown - Active. Milk Thistle  (Oral) Specific dose unknown - Active. Aspirin (325MG  Tablet, 1 (one) Oral) Active. Estradiol (1MG  Tablet, Oral) Active. Triamterene-HCTZ (75-50MG  Tablet, Oral) Active. Multivitamin (Oral) Active. Medications Reconciled  Past Surgical History Appendectomy Arthroscopy of Knee right Hysterectomy partial (non-cancerous) Tonsillectomy  Past Medical Hx High blood pressure Diverticulosis Irritable bowel syndrome DJD  Review of Systems General Not Present- Chills, Fatigue, Fever, Memory Loss, Night Sweats, Weight Gain and Weight Loss. Skin Not Present- Eczema, Hives, Itching, Lesions and Rash. HEENT Not Present- Dentures, Double Vision, Headache, Hearing Loss, Tinnitus and Visual Loss. Respiratory Not Present- Allergies, Chronic Cough, Coughing up blood, Shortness of breath at rest and Shortness of breath with exertion. Cardiovascular Not Present- Chest Pain, Difficulty Breathing Lying Down, Murmur, Palpitations, Racing/skipping heartbeats and Swelling. Gastrointestinal Not Present- Abdominal Pain, Bloody Stool, Constipation, Diarrhea, Difficulty Swallowing, Heartburn, Jaundice, Loss of appetitie, Nausea and Vomiting. Female Genitourinary Not Present- Blood in Urine, Discharge, Flank Pain, Incontinence, Painful Urination, Urgency, Urinary frequency, Urinary Retention, Urinating at Night and Weak urinary stream. Musculoskeletal Present- Joint Pain, Joint Swelling and Morning Stiffness. Not Present- Back Pain, Muscle Pain, Muscle Weakness and Spasms. Neurological Not Present- Blackout spells, Difficulty with balance, Dizziness, Paralysis, Tremor and Weakness. Psychiatric Not Present- Insomnia.  Physical Exam General Mental Status -Alert, cooperative and good historian. General Appearance-pleasant, Not in acute distress. Orientation-Oriented X3. Build & Nutrition-Well nourished and Well developed. Gait-Stiff and Antalgic.  Head and Neck Head-normocephalic,  atraumatic . Neck Global Assessment - supple, no bruit auscultated on the right, no bruit auscultated on the left.  Eye Pupil - Bilateral-Regular and Round. Motion - Bilateral-EOMI.  Chest and Lung Exam Auscultation Breath sounds - clear at anterior chest wall and clear at posterior chest wall. Adventitious sounds - No Adventitious sounds.  Cardiovascular Auscultation Rhythm - Regular rate and rhythm. Heart Sounds - S1 WNL and S2 WNL. Murmurs & Other Heart Sounds - Auscultation of the heart reveals - No Murmurs.  Abdomen Palpation/Percussion Tenderness - Abdomen is non-tender to palpation. Rigidity (guarding) - Abdomen is soft. Auscultation Auscultation of the abdomen reveals - Bowel sounds normal.  Female Genitourinary Not done, not pertinent to present illness  Musculoskeletal Note: Right Knee: Inspection and Palpation - Tenderness - medial joint line tender to palpation and lateral joint line tender to palpation, no tenderness to palpation of the superior calf, no tenderness to palpation of the pes anserine bursa, no tenderness to palpation of the quadriceps tendon, no tenderness to palpation of the patellar tendon, no tenderness to palpation of the patella, no tenderness to palpation of the fibular head, no tenderness to palpation of the peroneal nerve. Patellar Tendon - no pain to palpation of the patellar tendon. Swelling - periarticular swelling present. Effusion - trace. Tissue tension/texture is - soft. Crepitus - moderate patellofemoral crepitus. Pulses - 2+. Sensation - intact to light touch. Skin - Color - no ecchymosis, no erythema. Strength and Tone - Quadriceps - 5/5. Hamstrings - 5/5. ROM: Flexion - AROM - 110 . Extension - AROM - 0 . Stability - Valgus Laxity at 30 - None. Valgus Laxity at 0 - None. Varus Laxity at 30 - None. Varus Laxity at 0 - None. Lachman - Negative. Anterior Drawer Test - Negative. Posterior Drawer Test - Negative. Right Knee -  Deformities/Malalignments/Discrepancies - no deformities noted. Special Tests - McMurray Test (lateral) - negative. McMurray Test (medial) - negative. Patellar Compression Pain - mild  pain.  Imaging Xrays reviewed from 5/15 with severe medial and PF arthrosis bilaterally, end-stage. Mild varus, right worse than left.  Assessment & Plan Primary osteoarthritis of right knee   Pt with end-stage DJD bilateral knees, right more symptomatic then left, refractory to conservative tx including steroid injections, bracing, activity modification, quad strengthening, HEP, relative rest, scheduled for right total knee replacement by Dr. Tonita Cong on 05/27/14. We again discussed the procedure itself as well as risks, complications and alternatives, including but not limited to DVT, PE, infx, bleeding, failure of procedure, need for secondary procedure including manipulation, nerve injury, ongoing pain/symptoms, anesthesia risk, even stroke or death. Also discussed typical post-op protocols, activity restrictions, need for PT, flexion/extension exercises, time out of work. Discussed need for DVT ppx post-op with Xarelto then ASA per protocol. Discussed dental ppx. Also discussed limitations post-operatively such as kneeling and squatting. All questions were answered. Patient desires to proceed with surgery as scheduled. Will continue to hold ASA, supplements, and NSAIDs accordingly. Will remain NPO after [place] night before surgery. Will present to [facility] for pre-op testing today. Plan Xarelto 2 weeks post-op for DVT ppx then ASA. Plan Norco 7.5 or 10mg  on D/C as she is taking 5mg  prn pre-op, Robaxin, Colace (has at home due to GI hx). Plan home with HHPT post-op with husband at home for assistance. Will follow up 10-14 days post-op for suture removal and xrays. She will call with any questions or concerns in the interim.  Plan R total knee replacement  Signed electronically by Lacie Draft, PA-C for Dr. Tonita Cong

## 2014-05-27 NOTE — Anesthesia Postprocedure Evaluation (Signed)
  Anesthesia Post-op Note  Patient: Laura Hahn  Procedure(s) Performed: Procedure(s) (LRB): TOTAL RIGHT KNEE ARTHROPLASTY (Right)  Patient Location: PACU  Anesthesia Type: General  Level of Consciousness: awake and alert   Airway and Oxygen Therapy: Patient Spontanous Breathing  Post-op Pain: mild  Post-op Assessment: Post-op Vital signs reviewed, Patient's Cardiovascular Status Stable, Respiratory Function Stable, Patent Airway and No signs of Nausea or vomiting  Last Vitals:  Filed Vitals:   05/27/14 1115  BP:   Pulse:   Temp: 37 C  Resp:     Post-op Vital Signs: stable   Complications: No apparent anesthesia complications

## 2014-05-27 NOTE — Progress Notes (Signed)
PACU note-----pt shivering on arrival to PACU; Dr. Landry Dyke, anesthes, called, order rec'd and med given and shivering better

## 2014-05-27 NOTE — Progress Notes (Signed)
Utilization review completed.  

## 2014-05-27 NOTE — Interval H&P Note (Signed)
History and Physical Interval Note:  05/27/2014 7:37 AM  Laura Hahn  has presented today for surgery, with the diagnosis of RIGHT KNEE DJD   The various methods of treatment have been discussed with the patient and family. After consideration of risks, benefits and other options for treatment, the patient has consented to  Procedure(s): TOTAL RIGHT KNEE ARTHROPLASTY (Right) as a surgical intervention .  The patient's history has been reviewed, patient examined, no change in status, stable for surgery.  I have reviewed the patient's chart and labs.  Questions were answered to the patient's satisfaction.     Berlie Persky C

## 2014-05-27 NOTE — Evaluation (Signed)
Physical Therapy Evaluation Patient Details Name: Laura Hahn MRN: [id number] DOB: December 20, 1950 Today's Date: 05/27/2014   History of Present Illness  R TKR  Clinical Impression  Pt s/p R TKR presents with decreased R LE strength/ROM and post op pain limiting functional mobility.  Pt should progress well to d/c home with family assist and HHPT follow up.    Follow Up Recommendations Home health PT    Equipment Recommendations  Rolling walker with 5" wheels    Recommendations for Other Services OT consult     Precautions / Restrictions Precautions Precautions: Knee;Fall Required Braces or Orthoses: Knee Immobilizer - Right Knee Immobilizer - Right: Discontinue once straight leg raise with < 10 degree lag Restrictions Weight Bearing Restrictions: No Other Position/Activity Restrictions: WBAT      Mobility  Bed Mobility Overal bed mobility: Needs Assistance Bed Mobility: Supine to Sit     Supine to sit: Min assist     General bed mobility comments: cues for sequence and use of L LE to self assist  Transfers Overall transfer level: Needs assistance Equipment used: Rolling walker (2 wheeled) Transfers: Sit to/from Stand Sit to Stand: Min assist;Mod assist         General transfer comment: cues for LE management and use of UEs to self assist  Ambulation/Gait Ambulation/Gait assistance: Min assist Ambulation Distance (Feet): 33 Feet Assistive device: Rolling walker (2 wheeled) Gait Pattern/deviations: Step-to pattern;Decreased step length - right;Decreased step length - left;Shuffle;Trunk flexed Gait velocity: decr   General Gait Details: cues for sequence, posture and position from [employer]            Wheelchair Mobility    Modified Rankin (Stroke Patients Only)       Balance                                             Pertinent Vitals/Pain Pain Assessment: 0-10 Pain Score: 4  Pain Location: R knee Pain Descriptors /  Indicators: Aching Pain Intervention(s): Limited activity within patient's tolerance;Monitored during session;Premedicated before session;Ice applied    Home Living Family/patient expects to be discharged to:: Private residence Living Arrangements: Spouse/significant other Available Help at Discharge: Family Type of Home: House Home Access: Level entry     Home Layout: Two level Home Equipment: None      Prior Function Level of Independence: Independent               Hand Dominance        Extremity/Trunk Assessment   Upper Extremity Assessment: Overall WFL for tasks assessed           Lower Extremity Assessment: RLE deficits/detail RLE Deficits / Details: 2/5 quads    Cervical / Trunk Assessment: Normal  Communication   Communication: No difficulties  Cognition Arousal/Alertness: Awake/alert Behavior During Therapy: WFL for tasks assessed/performed Overall Cognitive Status: Within Functional Limits for tasks assessed                      General Comments      Exercises        Assessment/Plan    PT Assessment Patient needs continued PT services  PT Diagnosis Difficulty walking   PT Problem List Decreased strength;Decreased range of motion;Decreased activity tolerance;Decreased mobility;Decreased knowledge of use of DME;Pain  PT Treatment Interventions DME instruction;Gait training;Therapeutic activities;Functional mobility training;Stair training;Therapeutic exercise;Patient/family education  PT Goals (Current goals can be found in the Care Plan section) Acute Rehab PT Goals Patient Stated Goal: Resume previous lifestyle with decreased pain` PT Goal Formulation: With patient Time For Goal Achievement: 06/03/14 Potential to Achieve Goals: Good    Frequency 7X/week   Barriers to discharge        Co-evaluation               End of Session Equipment Utilized During Treatment: Gait belt;Right knee immobilizer Activity Tolerance:  Patient tolerated treatment well Patient left: in chair;with call bell/phone within reach;with family/visitor present Nurse Communication: Mobility status         Time: 2505-3976 PT Time Calculation (min): 26 min   Charges:   PT Evaluation $Initial PT Evaluation Tier I: 1 Procedure PT Treatments $Gait Training: 8-22 mins   PT G Codes:          Laura Hahn 05/27/2014, 5:48 PM

## 2014-05-28 LAB — CBC
HCT: 34.2 % — ABNORMAL LOW (ref 36.0–46.0)
Hemoglobin: 12 g/dL (ref 12.0–15.0)
MCH: 33.5 pg (ref 26.0–34.0)
MCHC: 35.1 g/dL (ref 30.0–36.0)
MCV: 95.5 fL (ref 78.0–100.0)
Platelets: 233 10*3/uL (ref 150–400)
RBC: 3.58 MIL/uL — ABNORMAL LOW (ref 3.87–5.11)
RDW: 11.5 % (ref 11.5–15.5)
WBC: 10.5 10*3/uL (ref 4.0–10.5)

## 2014-05-28 LAB — BASIC METABOLIC PANEL
Anion gap: 13 (ref 5–15)
BUN: 9 mg/dL (ref 6–23)
CHLORIDE: 87 meq/L — AB (ref 96–112)
CO2: 26 mEq/L (ref 19–32)
CREATININE: 0.73 mg/dL (ref 0.50–1.10)
Calcium: 8.2 mg/dL — ABNORMAL LOW (ref 8.4–10.5)
GFR calc non Af Amer: >90 mL/min (ref >90)
Glucose, Bld: 122 mg/dL — ABNORMAL HIGH (ref 70–99)
Potassium: 3.2 mEq/L — ABNORMAL LOW (ref 3.7–5.3)
Sodium: 126 mEq/L — ABNORMAL LOW (ref 137–147)

## 2014-05-28 MED ORDER — POLYETHYLENE GLYCOL 3350 17 G PO PACK
17.0000 g | PACK | Freq: Every day | ORAL | Status: DC
  Administered 2014-05-28 – 2014-05-30 (×3): 17 g via ORAL

## 2014-05-28 MED ORDER — OXYCODONE HCL 5 MG PO TABS
5.0000 mg | ORAL_TABLET | ORAL | Status: DC | PRN
  Administered 2014-05-28 – 2014-05-29 (×3): 10 mg via ORAL
  Administered 2014-05-29: 15 mg via ORAL
  Administered 2014-05-29: 10 mg via ORAL
  Administered 2014-05-29: 15 mg via ORAL
  Administered 2014-05-30: 5 mg via ORAL
  Administered 2014-05-30: 10 mg via ORAL
  Administered 2014-05-30: 15 mg via ORAL
  Administered 2014-05-30: 5 mg via ORAL
  Administered 2014-05-31: 10 mg via ORAL
  Administered 2014-05-31 (×3): 15 mg via ORAL
  Filled 2014-05-28 (×2): qty 2
  Filled 2014-05-28: qty 3
  Filled 2014-05-28: qty 2
  Filled 2014-05-28 (×3): qty 3
  Filled 2014-05-28 (×2): qty 2
  Filled 2014-05-28 (×2): qty 3
  Filled 2014-05-28 (×2): qty 2
  Filled 2014-05-28: qty 1
  Filled 2014-05-28: qty 2

## 2014-05-28 NOTE — Discharge Instructions (Signed)
Elevate leg above heart 6x a day for 84minutes each Use knee immobilizer while walking until can SLR x 10 Use knee immobilizer in bed to keep knee in extension Aquacel dressing may remain in place until follow up. May shower with aquacel dressing in place but do not submerge in water and pat dry. If the dressing begins to peel off, you may trim the edges, or after 7 days may remove aquacel dressing. Do not remove steri-strips if they are present. If aquacel is removed, place new dressing with gauze and tape or ACE bandage which should be kept clean and dry and changed daily.   Information on my medicine - XARELTO (Rivaroxaban)  This medication education was reviewed with me or my healthcare representative as part of my discharge preparation.  The pharmacist that spoke with me during my hospital stay was:  Clovis Riley, [facility]  Why was Xarelto prescribed for you? Xarelto was prescribed for you to reduce the risk of blood clots forming after orthopedic surgery. The medical term for these abnormal blood clots is venous thromboembolism (VTE).  What do you need to know about xarelto ? Take your Xarelto ONCE DAILY at the same time every day. You may take it either with or without food.  If you have difficulty swallowing the tablet whole, you may crush it and mix in applesauce just prior to taking your dose.  Take Xarelto exactly as prescribed by your doctor and DO NOT stop taking Xarelto without talking to the doctor who prescribed the medication.  Stopping without other VTE prevention medication to take the place of Xarelto may increase your risk of developing a clot.  After discharge, you should have regular check-up appointments with your healthcare provider that is prescribing your Xarelto.    What do you do if you miss a dose? If you miss a dose, take it as soon as you remember on the same day then continue your regularly scheduled once daily regimen the next day. Do not take  two doses of Xarelto on the same day.   Important Safety Information A possible side effect of Xarelto is bleeding. You should call your healthcare provider right away if you experience any of the following:   Bleeding from an injury or your nose that does not stop.   Unusual colored urine (red or dark brown) or unusual colored stools (red or black).   Unusual bruising for unknown reasons.   A serious fall or if you hit your head (even if there is no bleeding).  Some medicines may interact with Xarelto and might increase your risk of bleeding while on Xarelto. To help avoid this, consult your healthcare provider or pharmacist prior to using any new prescription or non-prescription medications, including herbals, vitamins, non-steroidal anti-inflammatory drugs (NSAIDs) and supplements.  This website has more information on Xarelto: [email or web address]

## 2014-05-28 NOTE — Evaluation (Signed)
Occupational Therapy Evaluation Patient Details Name: Laura Hahn MRN: [id number] DOB: January 06, 1951 Today's Date: 05/28/2014    History of Present Illness R TKR   Clinical Impression   Pt limited by nausea and pain this session. Feel pt will progress well once nausea and pain improves. Will follow on acute to progress ADL independence.    Follow Up Recommendations  No OT follow up;Supervision/Assistance - 24 hour    Equipment Recommendations  3 in 1 bedside comode    Recommendations for Other Services       Precautions / Restrictions Precautions Precautions: Knee;Fall Required Braces or Orthoses: Knee Immobilizer - Right Knee Immobilizer - Right: Discontinue once straight leg raise with < 10 degree lag Restrictions Weight Bearing Restrictions: No Other Position/Activity Restrictions: WBAT      Mobility Bed Mobility                  Transfers Overall transfer level: Needs assistance Equipment used: Rolling walker (2 wheeled) Transfers: Sit to/from Stand Sit to Stand: Min assist         General transfer comment: verbal cues for hand placement and LE management. Pt attempts to let go of walker to sit prematurely.    Balance                                            ADL Overall ADL's : Needs assistance/impaired Eating/Feeding: Independent;Sitting   Grooming: Wash/dry hands;Sitting   Upper Body Bathing: Set up;Sitting;Supervision/ safety   Lower Body Bathing: Moderate assistance;Sit to/from stand   Upper Body Dressing : Set up;Supervision/safety;Sitting   Lower Body Dressing: Maximal assistance;Sit to/from stand Lower Body Dressing Details (indicate cue type and reason): pt nauseous with movement. Toilet Transfer: Minimal assistance;Ambulation;BSC;RW   Toileting- Clothing Manipulation and Hygiene: Minimal assistance;Sit to/from stand         General ADL Comments: Pt nauseous during session and pain up to 10/10 with  functional transfers. Nursing came in room and made aware of nausea persisting and pain level. Husband present for some of session. Discussed 3in1 and use as a shower seat. Pt declines needing AE and states husband will assist.      Vision                     Perception     Praxis      Pertinent Vitals/Pain Pain Assessment: 0-10 Pain Score: 10-Worst pain ever (down to 8 with some rest) Pain Location: R knee Pain Descriptors / Indicators: Aching Pain Intervention(s): Patient requesting pain meds-RN notified;Repositioned;Ice applied     Hand Dominance     Extremity/Trunk Assessment Upper Extremity Assessment Upper Extremity Assessment: Overall WFL for tasks assessed           Communication Communication Communication: No difficulties   Cognition Arousal/Alertness: Awake/alert Behavior During Therapy: WFL for tasks assessed/performed Overall Cognitive Status: Within Functional Limits for tasks assessed                     General Comments       Exercises       Shoulder Instructions      Home Living Family/patient expects to be discharged to:: Private residence Living Arrangements: Spouse/significant other Available Help at Discharge: Family Type of Home: House Home Access: Level entry     Home Layout: Two level Alternate Level Stairs-Number of Steps:  1   Bathroom Shower/Tub: Occupational psychologist: Standard     Home Equipment: None          Prior Functioning/Environment Level of Independence: Independent             OT Diagnosis: Generalized weakness;Acute pain   OT Problem List: Decreased strength;Decreased knowledge of use of DME or AE;Pain   OT Treatment/Interventions: Self-care/ADL training;Patient/family education;Therapeutic activities;DME and/or AE instruction    OT Goals(Current goals can be found in the care plan section) Acute Rehab OT Goals Patient Stated Goal: decrease pain and nausea OT Goal  Formulation: With patient/family Time For Goal Achievement: 06/04/14 Potential to Achieve Goals: Good  OT Frequency: Min 2X/week   Barriers to D/C:            Co-evaluation              End of Session Equipment Utilized During Treatment: Gait belt;Rolling walker;Right knee immobilizer  Activity Tolerance: Patient limited by pain;Other (comment) (nausa) Patient left: in chair;with call bell/phone within reach;with family/visitor present;with nursing/sitter in room   Time: 1035-1105 OT Time Calculation (min): 30 min Charges:  OT General Charges $OT Visit: 1 Procedure OT Evaluation $Initial OT Evaluation Tier I: 1 Procedure OT Treatments $Self Care/Home Management : 8-22 mins $Therapeutic Activity: 8-22 mins G-Codes:    Jules Schick [phone] 05/28/2014, 11:30 AM

## 2014-05-28 NOTE — Progress Notes (Signed)
Physical Therapy Treatment Patient Details Name: Laura Hahn MRN: [id number] DOB: March 15, 1951 Today's Date: 05/28/2014    History of Present Illness R TKR    PT Comments    Pt cooperative and participating but continues ltd by nausea and fatigue  Follow Up Recommendations  Home health PT     Equipment Recommendations  Rolling walker with 5" wheels    Recommendations for Other Services OT consult     Precautions / Restrictions Precautions Precautions: Knee;Fall Required Braces or Orthoses: Knee Immobilizer - Right Knee Immobilizer - Right: Discontinue once straight leg raise with < 10 degree lag Restrictions Weight Bearing Restrictions: No Other Position/Activity Restrictions: WBAT    Mobility  Bed Mobility Overal bed mobility: Needs Assistance Bed Mobility: Supine to Sit;Sit to Supine     Supine to sit: Min assist Sit to supine: Min assist   General bed mobility comments: cues for sequence and use of L LE to self assist  Transfers Overall transfer level: Needs assistance Equipment used: Rolling walker (2 wheeled) Transfers: Sit to/from Stand Sit to Stand: Min assist         General transfer comment: verbal cues for hand placement and LE management.  Ambulation/Gait Ambulation/Gait assistance: Min assist Ambulation Distance (Feet): 82 Feet (and 20') Assistive device: Rolling walker (2 wheeled) Gait Pattern/deviations: Step-to pattern;Decreased step length - right;Decreased step length - left;Shuffle;Trunk flexed Gait velocity: decr   General Gait Details: cues for sequence, posture, stride length, heel/toe and position from [employer]            Wheelchair Mobility    Modified Rankin (Stroke Patients Only)       Balance                                    Cognition Arousal/Alertness: Awake/alert Behavior During Therapy: WFL for tasks assessed/performed Overall Cognitive Status: Within Functional Limits for tasks  assessed                      Exercises      General Comments        Pertinent Vitals/Pain Pain Assessment: 0-10 Pain Score: 7  Pain Location: R knee Pain Intervention(s): Limited activity within patient's tolerance;Monitored during session;Ice applied;Patient requesting pain meds-RN notified    Home Living                      Prior Function            PT Goals (current goals can now be found in the care plan section) Acute Rehab PT Goals Patient Stated Goal: Resume previous lifestyle with decreased pain PT Goal Formulation: With patient Time For Goal Achievement: 06/03/14 Potential to Achieve Goals: Good Progress towards PT goals: Progressing toward goals    Frequency  7X/week    PT Plan Current plan remains appropriate    Co-evaluation             End of Session Equipment Utilized During Treatment: Gait belt;Right knee immobilizer Activity Tolerance: Patient tolerated treatment well Patient left: with call bell/phone within reach;with family/visitor present;in bed     Time: 0272-5366 PT Time Calculation (min): 28 min  Charges:  $Gait Training: 8-22 mins $Therapeutic Activity: 8-22 mins                    G Codes:      Tierany Appleby  05/28/2014, 3:59 PM

## 2014-05-28 NOTE — Progress Notes (Signed)
Patient ID: Laura Hahn, female   DOB: [birth year], 63 y.o.   MRN: [id number] Subjective: 1 Day Post-Op Procedure(s) (LRB): TOTAL RIGHT KNEE ARTHROPLASTY (Right) Patient reports pain as moderate.   Seen in AM rounds for Dr. Tonita Cong Patient has complaints of right knee pain- worst posteriorly. Also noting some nausea associated with pain meds, which she has experienced taking Percocet at home as well. No other c/o.  We will start therapy today. Plan is to go home with HHPT after hospital stay.  Objective: Vital signs in last 24 hours: Temp:  [97.7 F (36.5 C)-98.8 F (37.1 C)] 98.8 F (37.1 C) (09/11 0626) Pulse Rate:  [62-98] 67 (09/11 0626) Resp:  [7-18] 16 (09/11 0626) BP: (109-140)/(65-88) 112/70 mmHg (09/11 0626) SpO2:  [94 %-100 %] 96 % (09/11 0626)  Intake/Output from previous day:  Intake/Output Summary (Last 24 hours) at 05/28/14 0741 Last data filed at 05/28/14 0651  Gross per 24 hour  Intake 5314.25 ml  Output   2505 ml  Net 2809.25 ml    Intake/Output this shift:    Labs: Results for orders placed during the hospital encounter of 05/27/14  CBC      Result Value Ref Range   WBC 10.5  4.0 - 10.5 K/uL   RBC 3.58 (*) 3.87 - 5.11 MIL/uL   Hemoglobin 12.0  12.0 - 15.0 g/dL   HCT 34.2 (*) 36.0 - 46.0 %   MCV 95.5  78.0 - 100.0 fL   MCH 33.5  26.0 - 34.0 pg   MCHC 35.1  30.0 - 36.0 g/dL   RDW 11.5  11.5 - 15.5 %   Platelets 233  150 - 400 K/uL  BASIC METABOLIC PANEL      Result Value Ref Range   Sodium 126 (*) 137 - 147 mEq/L   Potassium 3.2 (*) 3.7 - 5.3 mEq/L   Chloride 87 (*) 96 - 112 mEq/L   CO2 26  19 - 32 mEq/L   Glucose, Bld 122 (*) 70 - 99 mg/dL   BUN 9  6 - 23 mg/dL   Creatinine, Ser 0.73  0.50 - 1.10 mg/dL   Calcium 8.2 (*) 8.4 - 10.5 mg/dL   GFR calc non Af Amer >90  >90 mL/min   GFR calc Af Amer >90  >90 mL/min   Anion gap 13  5 - 15    Exam - Neurologically intact ABD soft Neurovascular intact Sensation intact distally Intact pulses  distally Dorsiflexion/Plantar flexion intact Incision: dressing C/D/I and no drainage No cellulitis present Compartment soft no calf pain or sign of DVT Dressing - clean, dry, no drainage Motor function intact - moving foot and toes well on exam.   Assessment/Plan: 1 Day Post-Op Procedure(s) (LRB): TOTAL RIGHT KNEE ARTHROPLASTY (Right)  Advance diet Up with therapy D/C IV fluids Past Medical History  Diagnosis Date  . Family history of anesthesia complication     mother-nausea  . Hypertension   . IBS (irritable bowel syndrome)   . Diverticulosis   . Arthritis     DVT Prophylaxis - Xarelto Protocol Weight-Bearing as tolerated to R leg Keep foley until tomorrow. No vaccines. Will pull drain later today- output 90cc this AM so far Low Na, Ca, K, and Cl- likely dilutional Discussed pain control, pain meds Ice and elevation to reduce swelling Will discuss with Dr. Mliss Fritz, [place]. 05/28/2014, 7:41 AM

## 2014-05-28 NOTE — Progress Notes (Signed)
Physical Therapy Treatment Patient Details Name: Laura Hahn MRN: [id number] DOB: Apr 30, 1951 Today's Date: 05/28/2014    History of Present Illness R TKR    PT Comments    Pt pleasant and cooperative but with increased time required all tasks and limited this am by onset of nausea  Follow Up Recommendations  Home health PT     Equipment Recommendations  Rolling walker with 5" wheels    Recommendations for Other Services OT consult     Precautions / Restrictions Precautions Precautions: Knee;Fall Required Braces or Orthoses: Knee Immobilizer - Right Knee Immobilizer - Right: Discontinue once straight leg raise with < 10 degree lag Restrictions Weight Bearing Restrictions: No Other Position/Activity Restrictions: WBAT    Mobility  Bed Mobility Overal bed mobility: Needs Assistance Bed Mobility: Supine to Sit     Supine to sit: Min assist     General bed mobility comments: cues for sequence and use of L LE to self assist  Transfers Overall transfer level: Needs assistance Equipment used: Rolling walker (2 wheeled) Transfers: Sit to/from Stand Sit to Stand: Min assist         General transfer comment: verbal cues for hand placement and LE management. Pt attempts to let go of walker to sit prematurely.  Ambulation/Gait Ambulation/Gait assistance: Min assist Ambulation Distance (Feet): 30 Feet Assistive device: Rolling walker (2 wheeled) Gait Pattern/deviations: Step-to pattern;Decreased step length - right;Decreased step length - left;Shuffle;Trunk flexed Gait velocity: decr   General Gait Details: cues for sequence, posture and position from RW - ltd by onset of nausea   Stairs            Wheelchair Mobility    Modified Rankin (Stroke Patients Only)       Balance                                    Cognition Arousal/Alertness: Awake/alert Behavior During Therapy: WFL for tasks assessed/performed Overall Cognitive Status:  Within Functional Limits for tasks assessed                      Exercises Total Joint Exercises Ankle Circles/Pumps: AROM;Both;10 reps;Supine Quad Sets: AROM;Both;10 reps;Supine Heel Slides: AAROM;Right;10 reps;Supine Straight Leg Raises: AAROM;Right;10 reps;Supine Goniometric ROM: AAROM at R knee -10 - 35     General Comments        Pertinent Vitals/Pain Pain Assessment: 0-10 Pain Score: 10-Worst pain ever (down to 8 with some rest) Pain Location: R knee Pain Descriptors / Indicators: Aching Pain Intervention(s): Patient requesting pain meds-RN notified;Repositioned;Ice applied    Home Living Family/patient expects to be discharged to:: Private residence Living Arrangements: Spouse/significant other Available Help at Discharge: Family Type of Home: House Home Access: Level entry   Home Layout: Two level Home Equipment: None      Prior Function Level of Independence: Independent          PT Goals (current goals can now be found in the care plan section) Acute Rehab PT Goals Patient Stated Goal: decrease pain and nausea PT Goal Formulation: With patient Time For Goal Achievement: 06/03/14 Potential to Achieve Goals: Good Progress towards PT goals: Progressing toward goals    Frequency  7X/week    PT Plan Current plan remains appropriate    Co-evaluation             End of Session Equipment Utilized During Treatment: Gait belt;Right knee immobilizer  Activity Tolerance: Patient tolerated treatment well Patient left: in chair;with call bell/phone within reach;with family/visitor present     Time: 0905-0950 PT Time Calculation (min): 45 min  Charges:  $Gait Training: 8-22 mins $Therapeutic Exercise: 8-22 mins $Therapeutic Activity: 8-22 mins                    G Codes:      Laura Hahn 2014-06-06, 12:11 PM

## 2014-05-29 ENCOUNTER — Inpatient Hospital Stay (HOSPITAL_COMMUNITY): Payer: BC Managed Care – PPO

## 2014-05-29 DIAGNOSIS — E876 Hypokalemia: Secondary | ICD-10-CM | POA: Diagnosis not present

## 2014-05-29 DIAGNOSIS — E871 Hypo-osmolality and hyponatremia: Secondary | ICD-10-CM | POA: Diagnosis not present

## 2014-05-29 DIAGNOSIS — R1032 Left lower quadrant pain: Secondary | ICD-10-CM | POA: Diagnosis not present

## 2014-05-29 LAB — CBC
HCT: 32.3 % — ABNORMAL LOW (ref 36.0–46.0)
HEMOGLOBIN: 11.6 g/dL — AB (ref 12.0–15.0)
MCH: 33.1 pg (ref 26.0–34.0)
MCHC: 35.9 g/dL (ref 30.0–36.0)
MCV: 92.3 fL (ref 78.0–100.0)
Platelets: 221 10*3/uL (ref 150–400)
RBC: 3.5 MIL/uL — ABNORMAL LOW (ref 3.87–5.11)
RDW: 11.3 % — ABNORMAL LOW (ref 11.5–15.5)
WBC: 9.3 10*3/uL (ref 4.0–10.5)

## 2014-05-29 LAB — BASIC METABOLIC PANEL
ANION GAP: 13 (ref 5–15)
Anion gap: 12 (ref 5–15)
BUN: 5 mg/dL — AB (ref 6–23)
BUN: 3 mg/dL — ABNORMAL LOW (ref 6–23)
CALCIUM: 8.9 mg/dL (ref 8.4–10.5)
CHLORIDE: 80 meq/L — AB (ref 96–112)
CO2: 29 meq/L (ref 19–32)
CO2: 27 mEq/L (ref 19–32)
Calcium: 8.6 mg/dL (ref 8.4–10.5)
Chloride: 79 mEq/L — ABNORMAL LOW (ref 96–112)
Creatinine, Ser: 0.61 mg/dL (ref 0.50–1.10)
Creatinine, Ser: 0.54 mg/dL (ref 0.50–1.10)
GFR calc Af Amer: >90 mL/min (ref >90)
GFR calc Af Amer: >90 mL/min (ref >90)
GFR calc non Af Amer: >90 mL/min (ref >90)
GFR calc non Af Amer: >90 mL/min (ref >90)
Glucose, Bld: 134 mg/dL — ABNORMAL HIGH (ref 70–99)
Glucose, Bld: 158 mg/dL — ABNORMAL HIGH (ref 70–99)
POTASSIUM: 3.3 meq/L — AB (ref 3.7–5.3)
Potassium: 2.9 mEq/L — CL (ref 3.7–5.3)
SODIUM: 121 meq/L — AB (ref 137–147)
Sodium: 119 mEq/L — CL (ref 137–147)

## 2014-05-29 LAB — URINALYSIS, ROUTINE W REFLEX MICROSCOPIC
Bilirubin Urine: NEGATIVE
Glucose, UA: NEGATIVE mg/dL
Hgb urine dipstick: NEGATIVE
Ketones, ur: NEGATIVE mg/dL
Leukocytes, UA: NEGATIVE
NITRITE: NEGATIVE
PH: 7.5 (ref 5.0–8.0)
Protein, ur: NEGATIVE mg/dL
SPECIFIC GRAVITY, URINE: 1.008 (ref 1.005–1.030)
Urobilinogen, UA: 1 mg/dL (ref 0.0–1.0)

## 2014-05-29 LAB — MAGNESIUM: MAGNESIUM: 1.5 mg/dL (ref 1.5–2.5)

## 2014-05-29 MED ORDER — POTASSIUM CHLORIDE CRYS ER 20 MEQ PO TBCR
40.0000 meq | EXTENDED_RELEASE_TABLET | Freq: Three times a day (TID) | ORAL | Status: DC
  Administered 2014-05-29 – 2014-05-30 (×4): 40 meq via ORAL
  Filled 2014-05-29 (×7): qty 2

## 2014-05-29 MED ORDER — POTASSIUM CHLORIDE CRYS ER 20 MEQ PO TBCR
40.0000 meq | EXTENDED_RELEASE_TABLET | Freq: Two times a day (BID) | ORAL | Status: DC
  Administered 2014-05-29: 40 meq via ORAL
  Filled 2014-05-29 (×2): qty 2

## 2014-05-29 MED ORDER — SODIUM CHLORIDE 0.9 % IV SOLN
INTRAVENOUS | Status: DC
  Administered 2014-05-29: 14:00:00 via INTRAVENOUS

## 2014-05-29 MED ORDER — AMOXICILLIN-POT CLAVULANATE 875-125 MG PO TABS
1.0000 | ORAL_TABLET | Freq: Two times a day (BID) | ORAL | Status: DC
  Administered 2014-05-29 – 2014-05-31 (×4): 1 via ORAL
  Filled 2014-05-29 (×5): qty 1

## 2014-05-29 NOTE — Consult Note (Signed)
Consult Note  Laura Hahn:[id number] DOB: [birth year] DOA: 05/27/2014  Referring physician: Dr Kendall Flack PCP: Greig Right, MD   Reason for Consult: Hyponatremia, Hypokalemia  HPI: Laura Hahn is a 63 y.o. female  With a history of hypertension and diverticulitis who is postoperative day #2 from a right knee replacement. Over the past 2 days, her sodium levels have decreased as well as her potassium. She has been thirsty and has and having IV fluids since the time of her operation. Additionally, she has continued on her diuretics, Maxzide, which is prescribed to control her blood pressure.   Additionally, she is been having increased abdominal distention with discomfort particularly in the mid lower to left lower quadrant. She has a history of irregular bowel habits and tends to be constipated. She regularly takes Colace and Metamucil to control irregularity of her bowel habits. Additionally, use of narcotics tend to make her constipated. She has not had a bowel movement for 4-5 days. Additionally, she does have a history of diverticulosis and diverticulitis.   Review of Systems:   Pt complains of abdominal distention, frequency, constipation, nausea and vomiting.  Pt denies any fevers, chills, chest pain, shortness of breath, headache, blood per rectum.  Review of systems are otherwise negative  Past Medical History  Diagnosis Date  . Family history of anesthesia complication     mother-nausea  . Hypertension   . IBS (irritable bowel syndrome)   . Diverticulosis   . Arthritis    Past Surgical History  Procedure Laterality Date  . Tonsillectomy    . Abdominal hysterectomy      partial   . Appendectomy    . Wedge resection of ovary    . Cyst of right finger and toe joint    . Total knee arthroplasty Right 05/27/2014    Procedure: TOTAL RIGHT KNEE ARTHROPLASTY;  Surgeon: Johnn Hai, MD;  Location: [facility];  Service: Orthopedics;  Laterality: Right;   Social  History:  reports that she has never smoked. She has never used smokeless tobacco. She reports that she drinks about 1.2 - 1.8 ounces of alcohol per week. She reports that she does not use illicit drugs. Patient lives at home & is able to participate in activities of daily living  No Known Allergies  History reviewed. No pertinent family history.    Prior to Admission medications   Medication Sig Start Date End Date Taking? Authorizing Provider  estrogen-methylTESTOSTERone (ESTRATEST) 1.25-2.5 MG per tablet Take 1 tablet by mouth every morning.   Yes Historical Provider, MD  losartan (COZAAR) 50 MG tablet Take 50 mg by mouth every evening.   Yes Historical Provider, MD  triamterene-hydrochlorothiazide (MAXZIDE) 75-50 MG per tablet Take 1 tablet by mouth every morning.   Yes Historical Provider, MD  docusate sodium (COLACE) 100 MG capsule Take 1 capsule (100 mg total) by mouth 2 (two) times daily as needed for mild constipation. 05/27/14   Johnn Hai, MD  methocarbamol (ROBAXIN) 500 MG tablet Take 1 tablet (500 mg total) by mouth 3 (three) times daily between meals as needed for muscle spasms. 05/27/14   Johnn Hai, MD  oxyCODONE-acetaminophen (PERCOCET) 7.5-325 MG per tablet Take 1-2 tablets by mouth every 4 (four) hours as needed for pain. 05/27/14   Johnn Hai, MD  rivaroxaban (XARELTO) 10 MG TABS tablet Take 1 tablet (10 mg total) by mouth daily. 05/27/14   Johnn Hai, MD    Physical Exam: BP 159/88  Pulse  90  Temp(Src) 98.5 F (36.9 C) (Oral)  Resp 16  Ht 5\' 8"  (1.727 m)  Wt 80.287 kg (177 lb)  BMI 26.92 kg/m2  SpO2 97%  General: Middle-aged Caucasian female. Awake and alert and oriented x3. No acute cardiopulmonary distress.  Eyes: Pupils equal, round, reactive to light. Extraocular muscles are intact. Sclerae anicteric and noninjected.  ENT:  Moist mucosal membranes. No mucosal lesions. Teeth in good repair  Neck: Neck supple without lymphadenopathy. No carotid  bruits. No masses palpated.  Cardiovascular: Regular rate with normal S1-S2 sounds. No murmurs, rubs, gallops auscultated. No JVD.  Respiratory: Good respiratory effort with no wheezes, rales, rhonchi. Lungs clear to auscultation bilaterally.  Abdomen: Soft. Mild tenderness in the lower portions. Hypoactive bowel sounds. No masses or hepatosplenomegaly  Skin: Dry, warm to touch. 2+ dorsalis pedis and radial pulses. Musculoskeletal: No calf or leg pain. All major joints not erythematous nontender.  Psychiatric: Intact judgment and insight.  Neurologic: No focal neurological deficits. Cranial nerves II through XII are grossly intact.           Labs on Admission:  Basic Metabolic Panel:  Recent Labs Lab 05/28/14 0432 05/29/14 1058  NA 126* 121*  K 3.2* 2.9*  CL 87* 79*  CO2 26 29  GLUCOSE 122* 134*  BUN 9 5*  CREATININE 0.73 0.61  CALCIUM 8.2* 8.6   Liver Function Tests: No results found for this basename: AST, ALT, ALKPHOS, BILITOT, PROT, ALBUMIN,  in the last 168 hours No results found for this basename: LIPASE, AMYLASE,  in the last 168 hours No results found for this basename: AMMONIA,  in the last 168 hours CBC:  Recent Labs Lab 05/28/14 0432 05/29/14 0446  WBC 10.5 9.3  HGB 12.0 11.6*  HCT 34.2* 32.3*  MCV 95.5 92.3  PLT 233 221   Cardiac Enzymes: No results found for this basename: CKTOTAL, CKMB, CKMBINDEX, TROPONINI,  in the last 168 hours  BNP (last 3 results) No results found for this basename: PROBNP,  in the last 8760 hours CBG: No results found for this basename: GLUCAP,  in the last 168 hours  Radiological Exams on Admission: No results found.   Impression and recommendations  #1 status post right hip replacement Continue postoperative plan and pain medicine per orthopedics. Would recommend trying to limit narcotics as much as possible while keeping the patient comfortable in order to mitigate the risk of obstruction.  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 We'll obtain serum and urine osmolalities as well as urine sodium and potassium. We'll also fluid restrict the patient to approximately 800 mL per day. Will hold Maxide as this may contribute to hyponatremia.  #3 hypokalemia We'll replace with 40 mEq of potassium 3 times a day. Additionally, will check serum magnesium.  #4 abdominal pain With the patient's white count normal and without fevers or significant tenderness in the left lower quadrant, the patient is likely constipated.  I am doubtful that the patient has a diverticulitis flare. The patient being postoperative and on narcotics, there is always the possibility of small bowel obstruction. We'll obtain x-ray to evaluate for this.  We'll continue with Colace, bisacodyl, MiraLAX. If these are ineffective, an enema can be ordered.  #5 hypertension Continue losartan  Thank you for allowing me to participate in the care of this patient. [facility] will continue to follow this patient.   DVT prophylaxis:  On Xarelto  Time spent:  39  minutes  Loma Boston, DO [facility] Pager [phone]  **Disclaimer: This note may have been dictated with voice recognition software. Similar sounding words can inadvertently be transcribed and this note may contain transcription errors which may not have been corrected upon publication of note.**

## 2014-05-29 NOTE — Progress Notes (Signed)
PT Cancellation Note  Patient Details Name: PURA PICINICH MRN: [id number] DOB: 1951-01-11   Cancelled Treatment:     PT deferred with decreased Na+ and K+;  Will follow in am.   Boruch Manuele 05/29/2014, 2:59 PM

## 2014-05-29 NOTE — Progress Notes (Signed)
Critical lab values from lab. PA paged. Laura Hahn, [employer]

## 2014-05-29 NOTE — Progress Notes (Signed)
     Subjective: 2 Days Post-Op Procedure(s) (LRB): TOTAL RIGHT KNEE ARTHROPLASTY (Right)   Patient reports pain as moderate, pain not well controlled. Complaining of some nausea from the anesthesia, which is a known issue. No events other than pain and nausea throughout the night.   Objective:   VITALS:   Filed Vitals:   05/29/14 0505  BP: 135/87  Pulse: 81  Temp: 98.8 F (37.1 C)  Resp: 16    Pain mainly in the posterior aspect of the knee Dorsiflexion/Plantar flexion intact Incision: dressing C/D/I No cellulitis present Compartment soft  LABS  Recent Labs  05/28/14 0432 05/29/14 0446  HGB 12.0 11.6*  HCT 34.2* 32.3*  WBC 10.5 9.3  PLT 233 221     Recent Labs  05/28/14 0432  NA 126*  K 3.2*  BUN 9  CREATININE 0.73  GLUCOSE 122*     Assessment/Plan: 2 Days Post-Op Procedure(s) (LRB): TOTAL RIGHT KNEE ARTHROPLASTY (Right) Advance diet Up with therapy Discharge home with home health eventually, when ready possibly Sunday if ready   [place] Pugh. Ranita Stjulien   PAC  05/29/2014, 8:33 AM

## 2014-05-29 NOTE — Plan of Care (Signed)
Problem: Phase III Progression Outcomes Goal: Anticoagulant follow-up in place Outcome: Not Applicable Date Met:  18/29/93 xarelto

## 2014-05-29 NOTE — Progress Notes (Signed)
Physical Therapy Treatment Patient Details Name: Laura Hahn MRN: [id number] DOB: [birth year] Today's Date: 06-24-14    History of Present Illness R TKR    PT Comments    Therex performed with increased time required, OOB deferred 2* pt lethargy  Follow Up Recommendations  Home health PT     Equipment Recommendations  Rolling walker with 5" wheels    Recommendations for Other Services OT consult     Precautions / Restrictions Precautions Precautions: Knee;Fall Restrictions Weight Bearing Restrictions: No Other Position/Activity Restrictions: WBAT    Mobility  Bed Mobility                  Transfers                    Ambulation/Gait                 Stairs            Wheelchair Mobility    Modified Rankin (Stroke Patients Only)       Balance                                    Cognition Arousal/Alertness: Awake/alert Behavior During Therapy: WFL for tasks assessed/performed Overall Cognitive Status: Within Functional Limits for tasks assessed                      Exercises Total Joint Exercises Ankle Circles/Pumps: AROM;Both;Supine;15 reps Quad Sets: AROM;Both;Supine;20 reps Heel Slides: AAROM;Right;Supine;20 reps Straight Leg Raises: AAROM;Right;Supine;20 reps    General Comments        Pertinent Vitals/Pain Pain Assessment: 0-10 Pain Score: 5  Pain Location: R knee Pain Descriptors / Indicators: Aching;Sore Pain Intervention(s): Limited activity within patient's tolerance;Monitored during session;Premedicated before session;Ice applied    Home Living                      Prior Function            PT Goals (current goals can now be found in the care plan section) Acute Rehab PT Goals Patient Stated Goal: Resume previous lifestyle with decreased pain PT Goal Formulation: With patient Time For Goal Achievement: 06/03/14 Potential to Achieve Goals: Good Progress towards  PT goals: Progressing toward goals    Frequency  7X/week    PT Plan Current plan remains appropriate    Co-evaluation             End of Session   Activity Tolerance: Patient limited by lethargy Patient left: in bed;with call bell/phone within reach;with family/visitor present     Time: 1039-1105 PT Time Calculation (min): 26 min  Charges:  $Therapeutic Exercise: 23-37 mins                    G Codes:      Jabez Molner 06/24/14, 1:25 PM

## 2014-05-29 NOTE — Progress Notes (Signed)
OT Cancellation Note  Patient Details Name: Laura Hahn MRN: [id number] DOB: Jan 06, 1951   Cancelled Treatment:    Reason Eval/Treat Not Completed: Medical issues which prohibited therapy (nausea)  Itzae Miralles A 05/29/2014, 10:18 AM

## 2014-05-29 NOTE — Progress Notes (Signed)
[place], PA, returned page, lab values given, orders received. Talon Regala, [employer]

## 2014-05-30 DIAGNOSIS — E876 Hypokalemia: Secondary | ICD-10-CM

## 2014-05-30 DIAGNOSIS — E871 Hypo-osmolality and hyponatremia: Secondary | ICD-10-CM

## 2014-05-30 DIAGNOSIS — M1710 Unilateral primary osteoarthritis, unspecified knee: Secondary | ICD-10-CM

## 2014-05-30 DIAGNOSIS — IMO0002 Reserved for concepts with insufficient information to code with codable children: Secondary | ICD-10-CM

## 2014-05-30 DIAGNOSIS — R1032 Left lower quadrant pain: Secondary | ICD-10-CM

## 2014-05-30 LAB — CBC
HCT: 31.6 % — ABNORMAL LOW (ref 36.0–46.0)
Hemoglobin: 11.6 g/dL — ABNORMAL LOW (ref 12.0–15.0)
MCH: 33.1 pg (ref 26.0–34.0)
MCHC: 36.7 g/dL — ABNORMAL HIGH (ref 30.0–36.0)
MCV: 90.3 fL (ref 78.0–100.0)
PLATELETS: 248 10*3/uL (ref 150–400)
RBC: 3.5 MIL/uL — AB (ref 3.87–5.11)
RDW: 11 % — ABNORMAL LOW (ref 11.5–15.5)
WBC: 7.1 10*3/uL (ref 4.0–10.5)

## 2014-05-30 LAB — CREATININE, URINE, RANDOM: Creatinine, Urine: 21.76 mg/dL

## 2014-05-30 LAB — BASIC METABOLIC PANEL
ANION GAP: 11 (ref 5–15)
Anion gap: 12 (ref 5–15)
BUN: 3 mg/dL — ABNORMAL LOW (ref 6–23)
BUN: 3 mg/dL — AB (ref 6–23)
CALCIUM: 9 mg/dL (ref 8.4–10.5)
CHLORIDE: 84 meq/L — AB (ref 96–112)
CO2: 27 meq/L (ref 19–32)
CO2: 28 mEq/L (ref 19–32)
CREATININE: 0.58 mg/dL (ref 0.50–1.10)
Calcium: 9 mg/dL (ref 8.4–10.5)
Chloride: 89 mEq/L — ABNORMAL LOW (ref 96–112)
Creatinine, Ser: 0.61 mg/dL (ref 0.50–1.10)
GFR calc Af Amer: >90 mL/min (ref >90)
GFR calc Af Amer: >90 mL/min (ref >90)
GFR calc non Af Amer: >90 mL/min (ref >90)
GLUCOSE: 167 mg/dL — AB (ref 70–99)
Glucose, Bld: 138 mg/dL — ABNORMAL HIGH (ref 70–99)
POTASSIUM: 3.6 meq/L — AB (ref 3.7–5.3)
Potassium: 3.7 mEq/L (ref 3.7–5.3)
SODIUM: 122 meq/L — AB (ref 137–147)
SODIUM: 129 meq/L — AB (ref 137–147)

## 2014-05-30 LAB — NA AND K (SODIUM & POTASSIUM), RAND UR
Potassium Urine: 6 mEq/L
Sodium, Ur: 46 mEq/L

## 2014-05-30 LAB — OSMOLALITY, URINE: Osmolality, Ur: 147 mOsm/kg — ABNORMAL LOW (ref 390–1090)

## 2014-05-30 LAB — SODIUM, URINE, RANDOM: Sodium, Ur: <20 mEq/L

## 2014-05-30 LAB — TSH: TSH: 1.34 u[IU]/mL (ref 0.350–4.500)

## 2014-05-30 LAB — CORTISOL: Cortisol, Plasma: 25.9 ug/dL

## 2014-05-30 LAB — OSMOLALITY: OSMOLALITY: 241 mosm/kg — AB (ref 275–300)

## 2014-05-30 MED ORDER — POLYETHYLENE GLYCOL 3350 17 G PO PACK
17.0000 g | PACK | Freq: Two times a day (BID) | ORAL | Status: DC
  Administered 2014-05-31: 17 g via ORAL

## 2014-05-30 MED ORDER — MAGNESIUM SULFATE 40 MG/ML IJ SOLN
2.0000 g | Freq: Once | INTRAMUSCULAR | Status: AC
  Administered 2014-05-30: 2 g via INTRAVENOUS
  Filled 2014-05-30: qty 50

## 2014-05-30 MED ORDER — SENNOSIDES-DOCUSATE SODIUM 8.6-50 MG PO TABS
1.0000 | ORAL_TABLET | Freq: Every day | ORAL | Status: DC
  Filled 2014-05-30: qty 1

## 2014-05-30 MED ORDER — MAGNESIUM CITRATE PO SOLN
1.0000 | Freq: Once | ORAL | Status: AC
  Administered 2014-05-30: 1 via ORAL

## 2014-05-30 NOTE — Progress Notes (Signed)
CARE MANAGEMENT NOTE 05/30/2014  Patient:  Laura Hahn, Laura Hahn   Account Number:  [id number]  Date Initiated:  05/29/2014  Documentation initiated by:  [facility]  Subjective/Objective Assessment:   TOTAL RIGHT KNEE ARTHROPLASTY     Action/Plan:   [place]   Anticipated DC Date:  05/30/2014   Anticipated DC Plan:  [place]  CM consult      [facility] Choice  HOME HEALTH   Choice offered to / List presented to:  C-1 Patient   DME arranged  3-N-1  Vassie Moselle      DME agency  [place] arranged  HH-2 PT      [place]   Status of service:  Completed, signed off Medicare Important Message given?   (If response is "NO", the following Medicare IM given date fields will be blank) Date Medicare IM given:   Medicare IM given by:   Date Additional Medicare IM given:   Additional Medicare IM given by:    Discharge Disposition:  [place]  Per UR Regulation:    If discussed at Long Length of Stay Meetings, dates discussed:    Comments:  05/30/2014 1100 NCM spoke to pt and agreeable to [place] for [facility]. Pt requesting RW and 3n1 for home. [place] notified for DME for home. Husband at home to assist with her care. Jonnie Finner RN CCM Case Mgmt phone [phone]

## 2014-05-30 NOTE — Progress Notes (Signed)
[facility] PROGRESS NOTE  Laura Hahn WVP:[id number] DOB: November 22, 1950 DOA: 05/27/2014 PCP: Greig Right, MD  Assessment/Plan: #1 status post right knee arthroplasty  Per primary team.  #2 hyponatremia Unknown etiology. Concern for SIADH as patient is postop. Serum osmolality is borderline low. Urine osmolality was low. Will check a urine sodium. Check a urine creatinine. Check a TSH. Check a cortisol level. Maxzide has been held. Patient currently on fluid restriction of 800 cc per day. Some improvement will with sodium levels. Follow.  #3 hypokalemia Magnesium level is 1.5. Will give some IV magnesium. Replete potassium.  #4 nausea/abdominal pain Likely secondary to constipation. Acute abdominal series is negative. Patient passing gas. Patient with no bowel movement in over 4-5 days. Patient asking for food. Will advance patient's diet to a soft diet. Will give patient some magnesium citrate. If no bowel movement will likely require an enema. If post bowel movement patient is still having abdominal pain, will need to get a CT of the abdomen and pelvis at that time. Would recommend limiting narcotic pain medications. Will place on a bowel regimen of MiraLAX twice daily and Senokot-S at bedtime.  #5 hypertension Maxide on hold secondary to problem #2. Continue losartan.  #6 prophylaxis xarelto for DVT prophylaxis.  Code Status: Full Family Communication: Updated patient has been at bedside. Disposition Plan: Per primary team   Consultants:  [facility] hospitalists: Dr. Nehemiah Settle 05/29/2014  Procedures:  Acute abdominal series 05/29/2014  Antibiotics:  Augmentin 05/29/2014  HPI/Subjective: Patient with some nausea overnight. Patient denies any emesis. Patient states some improvement with abdominal pain. Patient passing gas.  Objective: Filed Vitals:   05/30/14 0354  BP: 134/78  Pulse: 88  Temp: 99 F (37.2 C)  Resp: 16    Intake/Output Summary (Last 24 hours) at  05/30/14 1036 Last data filed at 05/30/14 0950  Gross per 24 hour  Intake 1626.67 ml  Output   3600 ml  Net -1973.33 ml   Filed Weights   05/27/14 0543  Weight: 80.287 kg (177 lb)    Exam:   General:  NAD  Cardiovascular: RRR  Respiratory: CTA B.  Abdomen: Soft, some tenderness in the suprapubic region and lower abdominal region, nondistended, positive bowel sounds  Musculoskeletal: No clubbing cyanosis or edema  Data Reviewed: Basic Metabolic Panel:  Recent Labs Lab 05/28/14 0432 05/29/14 1058 05/29/14 2218 05/30/14 0519  NA 126* 121* 119* 122*  K 3.2* 2.9* 3.3* 3.6*  CL 87* 79* 80* 84*  CO2 26 29 27 27   GLUCOSE 122* 134* 158* 138*  BUN 9 5* 3* 3*  CREATININE 0.73 0.61 0.54 0.61  CALCIUM 8.2* 8.6 8.9 9.0  MG  --   --  1.5  --    Liver Function Tests: No results found for this basename: AST, ALT, ALKPHOS, BILITOT, PROT, ALBUMIN,  in the last 168 hours No results found for this basename: LIPASE, AMYLASE,  in the last 168 hours No results found for this basename: AMMONIA,  in the last 168 hours CBC:  Recent Labs Lab 05/28/14 0432 05/29/14 0446 05/30/14 0519  WBC 10.5 9.3 7.1  HGB 12.0 11.6* 11.6*  HCT 34.2* 32.3* 31.6*  MCV 95.5 92.3 90.3  PLT 233 221 248   Cardiac Enzymes: No results found for this basename: CKTOTAL, CKMB, CKMBINDEX, TROPONINI,  in the last 168 hours BNP (last 3 results) No results found for this basename: PROBNP,  in the last 8760 hours CBG: No results found for this basename: GLUCAP,  in the  last 168 hours  No results found for this or any previous visit (from the past 240 hour(s)).   Studies: Dg Abd Acute W/chest  05/29/2014   CLINICAL DATA:  Lower abdominal discomfort and constipation.  EXAM: ACUTE ABDOMEN SERIES (ABDOMEN 2 VIEW & CHEST 1 VIEW)  COMPARISON:  Chest radiograph performed 05/18/2014, and CT of the abdomen and pelvis performed 04/11/2010  FINDINGS: The lungs are well-aerated. Minimal bibasilar atelectasis is  noted. There is no evidence of pleural effusion or pneumothorax. The cardiomediastinal silhouette is within normal limits.  The visualized bowel gas pattern is unremarkable. Scattered stool and air are seen within the colon; there is no evidence of small bowel dilatation to suggest obstruction. No free intra-abdominal air is identified on the provided upright view.  No acute osseous abnormalities are seen; the sacroiliac joints are unremarkable in appearance.  IMPRESSION: 1. Unremarkable bowel gas pattern; no free intra-abdominal air seen. 2. Minimal bibasilar atelectasis noted.   Electronically Signed   By: Garald Balding M.D.   On: 05/29/2014 22:31    Scheduled Meds: . amoxicillin-clavulanate  1 tablet Oral Q12H  . docusate sodium  100 mg Oral BID  . estrogen-methylTESTOSTERone  2 tablet Oral q morning - 10a  . losartan  50 mg Oral QPM  . polyethylene glycol  17 g Oral Daily  . potassium chloride  40 mEq Oral TID  . rivaroxaban  10 mg Oral Q breakfast   Continuous Infusions:   Principal Problem:   Right knee DJD Active Problems:   Hyponatremia   Hypokalemia   Abdominal pain, left lower quadrant    Time spent: 35 minutes    [facility] MD [facility] Pager [phone]. If 7PM-7AM, please contact night-coverage at [email or web address], password [facility] 05/30/2014, 10:36 AM  LOS: 3 days

## 2014-05-30 NOTE — Progress Notes (Signed)
Physical Therapy Treatment Patient Details Name: Laura Hahn MRN: [id number] DOB: May 30, 1951 Today's Date: 05/30/2014    History of Present Illness R TKR    PT Comments    Pt progressing, Na+ continues to be low(122) and K+ slightly low at 3.6, receiving oral meds TID; Feeling somewhat better, willing to work with PT. Pt and husband very pleasant but with many questions about transition to home, what to expect regarding recovery, mobility, etc.  Follow Up Recommendations  Home health PT     Equipment Recommendations  Rolling walker with 5" wheels    Recommendations for Other Services OT consult     Precautions / Restrictions Precautions Precautions: Knee;Fall Required Braces or Orthoses: Knee Immobilizer - Right Knee Immobilizer - Right: Discontinue once straight leg raise with < 10 degree lag Restrictions Other Position/Activity Restrictions: WBAT    Mobility  Bed Mobility Overal bed mobility: Needs Assistance Bed Mobility: Supine to Sit     Supine to sit: Min assist     General bed mobility comments: cues for sequence and use of L LE to self assist  Transfers Overall transfer level: Needs assistance Equipment used: Rolling walker (2 wheeled) Transfers: Sit to/from Stand Sit to Stand: Min assist         General transfer comment: verbal cues for hand placement and LE management.  Ambulation/Gait Ambulation/Gait assistance: Min assist Ambulation Distance (Feet): 120 Feet Assistive device: Rolling walker (2 wheeled) Gait Pattern/deviations: Step-to pattern;Step-through pattern;Decreased stride length Gait velocity: decr   General Gait Details: cues for sequence, posture, stride length, heel/toe and position from [employer]            Wheelchair Mobility    Modified Rankin (Stroke Patients Only)       Balance                                    Cognition Arousal/Alertness: Awake/alert Behavior During Therapy: WFL for  tasks assessed/performed Overall Cognitive Status: Within Functional Limits for tasks assessed                      Exercises Total Joint Exercises Ankle Circles/Pumps: AROM;Both;Supine;15 reps Quad Sets: AROM;Both;Supine;20 reps Heel Slides: AAROM;Right;Supine;20 reps Straight Leg Raises: AAROM;Right;15 reps;Supine Goniometric ROM: AAROM -6 - 94*    General Comments        Pertinent Vitals/Pain Pain Assessment: 0-10 Pain Score: 3  Pain Location: right knee Pain Intervention(s): Monitored during session;Premedicated before session;Limited activity within patient's tolerance;Ice applied    Home Living                      Prior Function            PT Goals (current goals can now be found in the care plan section) Acute Rehab PT Goals Patient Stated Goal: Resume previous lifestyle with decreased pain PT Goal Formulation: With patient Time For Goal Achievement: 06/03/14 Potential to Achieve Goals: Good Progress towards PT goals: Progressing toward goals    Frequency  7X/week    PT Plan Current plan remains appropriate    Co-evaluation             End of Session Equipment Utilized During Treatment: Gait belt;Right knee immobilizer Activity Tolerance: Patient tolerated treatment well Patient left: in chair;with call bell/phone within reach;with family/visitor present     Time: 5053-9767 PT Time Calculation (min): 54  min  Charges:  $Gait Training: 23-37 mins $Therapeutic Exercise: 23-37 mins                    G Codes:      Cheetara Hoge 06-22-14, 1:28 PM

## 2014-05-30 NOTE — Progress Notes (Signed)
CRITICAL VALUE ALERT  Critical value received:  Serum osmality 241  Date of notification:  05/30/14  Time of notification:  0312  Critical value read back:Yes.    Nurse who received alert:  Philemon Kingdom :)   MD notified (1st page):  Walden Field NP   Time of first page:  0315  MD notified (2nd page):  Time of second page:  Responding MD:  Walden Field NP   Time MD responded:  0320  Continue to monitor patient.

## 2014-05-30 NOTE — Progress Notes (Signed)
CRITICAL VALUE ALERT  Critical value received:  Na 119  Date of notification:  05/29/14  Time of notification:  2255  Critical value read back:Yes.    Nurse who received alert:  Philemon Kingdom :)  MD notified (1st page):  Nehemiah Settle  Time of first page:  2300  MD notified (2nd page):  Time of second page:  Responding MD:  Nehemiah Settle, DO  Time MD responded:  2305  Continue to monitor patient.

## 2014-05-30 NOTE — Progress Notes (Addendum)
05/30/14 1400  PT Visit Information  Last PT Received On 05/30/14  Assistance Needed +1  History of Present Illness R TKR  PT Time Calculation  PT Start Time 1415  PT Stop Time 1438  PT Time Calculation (min) 23 min  Subjective Data  Subjective I am here  Patient Stated Goal Resume previous lifestyle with decreased pain  Precautions  Precautions Knee;Fall  Required Braces or Orthoses Knee Immobilizer - Right  Knee Immobilizer - Right Discontinue once straight leg raise with < 10 degree lag  Restrictions  Other Position/Activity Restrictions WBAT  Pain Assessment  Pain Assessment 0-10  Pain Score 5  Pain Location right knee   Pain Descriptors / Indicators Tightness  Pain Intervention(s) Limited activity within patient's tolerance;Monitored during session;Premedicated before session;Ice applied  Cognition  Arousal/Alertness Awake/alert  Behavior During Therapy WFL for tasks assessed/performed  Overall Cognitive Status Within Functional Limits for tasks assessed  Bed Mobility  Overal bed mobility Needs Assistance  Bed Mobility Supine to Sit;Sit to Supine  Supine to sit Min assist  Sit to supine Min assist  General bed mobility comments cues for sequence and use of L LE to self assist  Transfers  Overall transfer level Needs assistance  Equipment used Rolling walker (2 wheeled)  Transfers Sit to/from Stand  Sit to Stand Min assist  General transfer comment verbal cues for hand placement and LE management.  Ambulation/Gait  Ambulation/Gait assistance Supervision  Ambulation Distance (Feet) 160 Feet  Assistive device Rolling walker (2 wheeled)  Gait Pattern/deviations Step-through pattern;Step-to pattern;Decreased stride length  Gait velocity decr  General Gait Details cues for sequence, posture, stride length, heel/toe and position from RW  General Comments  General comments (skin integrity, edema, etc.) pt felt she was too painful to go right into CPM, wanted to wait,  keep ice on and let her knee "calm down a little" prior to CPM; some redness medial right knee, has slight temp this pm but pt feels d/t bowel issues  Total Joint Exercises  Ankle Circles/Pumps AROM;Both;Supine;15 reps  Quad Sets AROM;10 reps  Heel Slides AAROM;Right;15 reps  PT - End of Session  Equipment Utilized During Treatment Gait belt;Right knee immobilizer  Activity Tolerance Patient tolerated treatment well  Patient left with call bell/phone within reach;with family/visitor present;in bed  Nurse Communication Mobility status  PT - Assessment/Plan  PT Plan Current plan remains appropriate  PT Frequency 7X/week  Recommendations for Other Services OT consult  Follow Up Recommendations Home health PT  PT equipment Rolling walker with 5" wheels  PT Goal Progression  Progress towards PT goals Progressing toward goals  Acute Rehab PT Goals  PT Goal Formulation With patient  Time For Goal Achievement 06/03/14  Potential to Achieve Goals Good  PT General Charges  $$ ACUTE PT VISIT 1 Procedure  PT Treatments  $Gait Training 8-22 mins x 2  $Therapeutic Exercise

## 2014-05-30 NOTE — Progress Notes (Signed)
OT Cancellation Note  Patient Details Name: Laura Hahn MRN: [id number] DOB: [birth year]   Cancelled Treatment:    Reason Eval/Treat Not Completed: Medical issues which prohibited therapy (low Na+ levels)  Xiong Haidar A 05/30/2014, 9:07 AM

## 2014-05-30 NOTE — Progress Notes (Signed)
Subjective: C/o nausea.  Hospitalist is following for this.  Right knee doing ok.  Limited ambulation due to GI issues.    Objective: Vital signs in last 24 hours: Temp:  [98.5 F (36.9 C)-99.1 F (37.3 C)] 99 F (37.2 C) (09/13 0354) Pulse Rate:  [88-98] 88 (09/13 0354) Resp:  [16] 16 (09/13 0354) BP: (134-159)/(78-88) 134/78 mmHg (09/13 0354) SpO2:  [93 %-97 %] 97 % (09/13 0354)  Intake/Output from previous day: 09/12 0701 - 09/13 0700 In: 1326.7 [P.O.:480; I.V.:846.7] Out: 3950 [Urine:3950] Intake/Output this shift: Total I/O In: 240 [P.O.:240] Out: 350 [Urine:350]   Recent Labs  05/28/14 0432 05/29/14 0446 05/30/14 0519  HGB 12.0 11.6* 11.6*    Recent Labs  05/29/14 0446 05/30/14 0519  WBC 9.3 7.1  RBC 3.50* 3.50*  HCT 32.3* 31.6*  PLT 221 248    Recent Labs  05/29/14 2218 05/30/14 0519  NA 119* 122*  K 3.3* 3.6*  CL 80* 84*  CO2 27 27  BUN 3* 3*  CREATININE 0.54 0.61  GLUCOSE 158* 138*  CALCIUM 8.9 9.0   No results found for this basename: LABPT, INR,  in the last 72 hours  Exam:  aquacel dressing intact.  Some knee swelling.  Calf nontender,  NVI.    Assessment/Plan: Continue present care for knee.  States that she has not been in CPM machine a lot.  Order is 4-6 hrs today.  Discussed with Lovena Le RN in room and will make sure this is done.    Laura Hahn M 05/30/2014, 9:12 AM

## 2014-05-31 LAB — CBC
HEMATOCRIT: 33.5 % — AB (ref 36.0–46.0)
Hemoglobin: 11.6 g/dL — ABNORMAL LOW (ref 12.0–15.0)
MCH: 33.1 pg (ref 26.0–34.0)
MCHC: 34.6 g/dL (ref 30.0–36.0)
MCV: 95.7 fL (ref 78.0–100.0)
Platelets: 260 10*3/uL (ref 150–400)
RBC: 3.5 MIL/uL — ABNORMAL LOW (ref 3.87–5.11)
RDW: 11.7 % (ref 11.5–15.5)
WBC: 5.5 10*3/uL (ref 4.0–10.5)

## 2014-05-31 LAB — BASIC METABOLIC PANEL
Anion gap: 9 (ref 5–15)
BUN: 5 mg/dL — AB (ref 6–23)
CALCIUM: 8.9 mg/dL (ref 8.4–10.5)
CO2: 28 meq/L (ref 19–32)
Chloride: 97 mEq/L (ref 96–112)
Creatinine, Ser: 0.69 mg/dL (ref 0.50–1.10)
GFR calc Af Amer: >90 mL/min (ref >90)
GFR calc non Af Amer: >90 mL/min (ref >90)
GLUCOSE: 113 mg/dL — AB (ref 70–99)
Potassium: 4.6 mEq/L (ref 3.7–5.3)
SODIUM: 134 meq/L — AB (ref 137–147)

## 2014-05-31 LAB — MAGNESIUM: Magnesium: 2.6 mg/dL — ABNORMAL HIGH (ref 1.5–2.5)

## 2014-05-31 MED ORDER — AMOXICILLIN-POT CLAVULANATE 875-125 MG PO TABS: 1.0000 | ORAL_TABLET | Freq: Two times a day (BID) | ORAL | Status: AC

## 2014-05-31 MED ORDER — SENNOSIDES-DOCUSATE SODIUM 8.6-50 MG PO TABS: 1.0000 | ORAL_TABLET | Freq: Every day | ORAL | Status: DC

## 2014-05-31 MED ORDER — POLYETHYLENE GLYCOL 3350 17 G PO PACK: 17.0000 g | PACK | Freq: Two times a day (BID) | ORAL | Status: DC

## 2014-05-31 NOTE — Progress Notes (Signed)
Subjective: 4 Days Post-Op Procedure(s) (LRB): TOTAL RIGHT KNEE ARTHROPLASTY (Right) Patient reports pain as mild.  Reports pain R knee, well controlled. Overall states she feels much better, abdominal pain and pressure resolved following multiple BM's yesterday.  Hoping for D/C home today  Objective: Vital signs in last 24 hours: Temp:  [98.4 F (36.9 C)-99.4 F (37.4 C)] 98.7 F (37.1 C) (09/14 0536) Pulse Rate:  [84-105] 84 (09/14 0536) Resp:  [16-18] 16 (09/14 0536) BP: (113-130)/(77-84) 113/78 mmHg (09/14 0536) SpO2:  [94 %-97 %] 97 % (09/14 0536)  Intake/Output from previous day: 09/13 0701 - 09/14 0700 In: 1010 [P.O.:1010] Out: 2850 [Urine:2850] Intake/Output this shift:     Recent Labs  05/29/14 0446 05/30/14 0519 05/31/14 0455  HGB 11.6* 11.6* 11.6*    Recent Labs  05/30/14 0519 05/31/14 0455  WBC 7.1 5.5  RBC 3.50* 3.50*  HCT 31.6* 33.5*  PLT 248 260    Recent Labs  05/30/14 1326 05/31/14 0455  NA 129* 134*  K 3.7 4.6  CL 89* 97  CO2 28 28  BUN 3* 5*  CREATININE 0.58 0.69  GLUCOSE 167* 113*  CALCIUM 9.0 8.9   No results found for this basename: LABPT, INR,  in the last 72 hours  Neurologically intact ABD soft Neurovascular intact Sensation intact distally Intact pulses distally Dorsiflexion/Plantar flexion intact Incision: dressing C/D/I and no drainage No cellulitis present Compartment soft No calf pain or sign of DVT Mild swelling RLE  Assessment/Plan: 4 Days Post-Op Procedure(s) (LRB): TOTAL RIGHT KNEE ARTHROPLASTY (Right) Advance diet Up with therapy  Hypokalemia resolved Hyponatremia improving, appreciate hospitalist input Constipation, now s/p BM with abdominal pain improved Ice and elevation to reduce swelling Discussed D/C instructions Potential D/C later today as long as pain remains well controlled and stable per hospitalists Will discuss with Dr. Mliss Fritz, Conley Rolls. 05/31/2014, 7:45 AM

## 2014-05-31 NOTE — Progress Notes (Addendum)
Physical Therapy Treatment Patient Details Name: Laura Hahn MRN: [id number] DOB: Sep 25, 1950 Today's Date: 05/31/2014    History of Present Illness R TKR    PT Comments    **Pt is progressing well from mobility and is ready to DC home from PT standpoint. She walked 200' with RW and performed TKA exercises with min assist. Pt demonstrates understanding of home exercise program. Acute PT goals have been met. She is safe to ambulate in halls independently.  *  Follow Up Recommendations  Home health PT     Equipment Recommendations  Rolling walker with 5" wheels    Recommendations for Other Services OT consult     Precautions / Restrictions Precautions Precautions: Knee;Fall Required Braces or Orthoses: Knee Immobilizer - Right Knee Immobilizer - Right: Discontinue once straight leg raise with < 10 degree lag Restrictions Other Position/Activity Restrictions: WBAT    Mobility  Bed Mobility Overal bed mobility: Modified Independent Bed Mobility: Sit to Supine;Supine to Sit     Supine to sit: Modified independent (Device/Increase time) Sit to supine: Modified independent (Device/Increase time)   General bed mobility comments: instructed pt to self assist with LLE  Transfers Overall transfer level: Modified independent Equipment used: Rolling walker (2 wheeled) Transfers: Sit to/from Stand Sit to Stand: Modified independent (Device/Increase time)            Ambulation/Gait Ambulation/Gait assistance: Modified independent (Device/Increase time) Ambulation Distance (Feet): 200 Feet Assistive device: Rolling walker (2 wheeled) Gait Pattern/deviations: Step-through pattern Gait velocity: decr   General Gait Details: good sequencing, no LOB   Stairs            Wheelchair Mobility    Modified Rankin (Stroke Patients Only)       Balance                                    Cognition Arousal/Alertness: Awake/alert Behavior During  Therapy: WFL for tasks assessed/performed Overall Cognitive Status: Within Functional Limits for tasks assessed                      Exercises Total Joint Exercises Ankle Circles/Pumps: AROM;Both;Supine;15 reps Quad Sets: AROM;Both;Supine;10 reps Short Arc Quad[place]clair Ship;Right;10 reps Heel Slides: AAROM;Right;Supine;10 reps Hip ABduction/ADduction: AAROM;Right;10 reps;Supine Straight Leg Raises: AAROM;Right;Supine;10 reps Long Arc Quad: AAROM;Right;10 reps;Seated Knee Flexion: AAROM;Right;10 reps;Seated Goniometric ROM: 85* flexion AROM seated, -10* AROM extension seated R knee    General Comments        Pertinent Vitals/Pain Pain Score: 7  Pain Location: R knee; 7/10 with walking, 10/10 with knee flexion/extension exercises Pain Descriptors / Indicators: Tightness Pain Intervention(s): Monitored during session;Premedicated before session;Ice applied    Home Living                      Prior Function            PT Goals (current goals can now be found in the care plan section) Acute Rehab PT Goals Patient Stated Goal: return to walking 1-2 miles/day PT Goal Formulation: With patient/family Time For Goal Achievement: 06/03/14 Potential to Achieve Goals: Good Progress towards PT goals: Goals met/education completed, patient discharged from PT    Frequency  7X/week    PT Plan Current plan remains appropriate    Co-evaluation             End of Session Equipment Utilized During Treatment: Gait belt Activity  Tolerance: Patient tolerated treatment well;Patient limited by pain Patient left: in chair;with call bell/phone within reach;with family/visitor present     Time: 6812-7517 PT Time Calculation (min): 38 min  Charges:  $Gait Training: 8-22 mins $Therapeutic Exercise: 8-22 mins $Therapeutic Activity: 8-22 mins                    G Codes:      Philomena Doheny 05/31/2014, 9:47 AM [phone]

## 2014-05-31 NOTE — Progress Notes (Signed)
Occupational Therapy Treatment Patient Details Name: Laura Hahn MRN: [id number] DOB: [birth year] Today's Date: 05/31/2014    History of present illness R TKR   OT comments  Pt plans to Dc this day  Follow Up Recommendations  No OT follow up;Supervision/Assistance - 24 hour    Equipment Recommendations  3 in 1 bedside comode       Precautions / Restrictions Precautions Precautions: Knee;Fall Required Braces or Orthoses: Knee Immobilizer - Right Knee Immobilizer - Right: Discontinue once straight leg raise with < 10 degree lag Restrictions Other Position/Activity Restrictions: WBAT       Mobility Bed Mobility Overal bed mobility: Modified Independent Bed Mobility: Sit to Supine;Supine to Sit     Supine to sit: Modified independent (Device/Increase time) Sit to supine: Modified independent (Device/Increase time)   General bed mobility comments: instructed pt to self assist with LLE  Transfers Overall transfer level: Modified independent Equipment used: Rolling walker (2 wheeled) Transfers: Sit to/from Stand Sit to Stand: Supervision         General transfer comment: verbal cues for hand placement and LE management.        ADL                       Lower Body Dressing: Minimal assistance;Sit to/from stand   Toilet Transfer: Supervision/safety;Ambulation;Comfort height toilet   Toileting- Clothing Manipulation and Hygiene: Supervision/safety;Sit to/from stand   Tub/ Shower Transfer: Supervision/safety;Rolling walker;Cueing for sequencing;Cueing for safety                      Cognition   Behavior During Therapy: [facility] for tasks assessed/performed Overall Cognitive Status: Within Functional Limits for tasks assessed                         Exercises Total Joint Exercises Ankle Circles/Pumps: AROM;Both;Supine;15 reps Quad Sets: AROM;Both;Supine;10 reps Short Arc Quad[place]clair Ship;Right;10 reps Heel Slides: AAROM;Right;Supine;10  reps Hip ABduction/ADduction: AAROM;Right;10 reps;Supine Straight Leg Raises: AAROM;Right;Supine;10 reps Long Arc Quad: AAROM;Right;10 reps;Seated Knee Flexion: AAROM;Right;10 reps;Seated Goniometric ROM: 85* flexion AROM seated, -10* AROM extension seated R knee      General Comments      Pertinent Vitals/ Pain       Pain Score: 4  Pain Location: R knee Pain Descriptors / Indicators: Tightness Pain Intervention(s): Monitored during session     Prior Functioning/Environment              Frequency Min 2X/week     Progress Toward Goals  OT Goals(current goals can now be found in the care plan section)  Progress towards OT goals: Progressing toward goals  Acute Rehab OT Goals Patient Stated Goal: return to walking 1-2 miles/day         End of Session Equipment Utilized During Treatment: Gait belt;Rolling walker CPM Right Knee CPM Right Knee: Off   Activity Tolerance Patient tolerated treatment well (nausa)   Patient Left in chair;with call bell/phone within reach;with family/visitor present;with nursing/sitter in room   Nurse Communication          Time: 1040-1052 OT Time Calculation (min): 12 min  Charges: OT General Charges $OT Visit: 1 Procedure OT Evaluation $Initial OT Evaluation Tier I: 1 Procedure OT Treatments $Self Care/Home Management : 8-22 mins  Amneet Cendejas, Thereasa Parkin 05/31/2014, 11:10 AM

## 2014-05-31 NOTE — Progress Notes (Signed)
[facility] PROGRESS NOTE  Laura Hahn JQB:[id number] DOB: 06/22/1951 DOA: 05/27/2014 PCP: Greig Right, MD  Assessment/Plan: #1 status post right knee arthroplasty  Per primary team.  #2 hyponatremia Unknown etiology. Concern for SIADH as patient was postop vs secondary to Maxzide. Serum osmolality is borderline low. Urine osmolality was low. Hyponatremia improved with fluid restriction. TSH is within normal limits. Cortisol level was within normal limits. Continue to hold Maxzide. Patient currently on fluid restriction of 800 cc per day. Outpatient followup.   #3 hypokalemia Magnesium level is 1.5. Will give some IV magnesium. Repleted.  #4 nausea/abdominal pain Likely secondary to constipation. Acute abdominal series is negative. Patient passing gas. Patient with bowel movements yesterday and today with improvement with abdominal pain and nausea. Patient tolerating current diet. Patient was started on Augmentin for possible abdominal pathology. Will recommend 3 more days to complete a five-day course of antibiotic therapy. Continue current bowel regimen. Would recommend limiting narcotic pain medications.   #5 hypertension Maxide on hold secondary to problem #2. Continue losartan. Will discontinue Maxide on discharge secondary to hyponatremia and blood pressure being stable. Patient to followup with PCP as outpatient.  #6 prophylaxis xarelto for DVT prophylaxis.  Patient is okay to be discharged from a medicine point of view.  Code Status: Full Family Communication: Updated patient has been at bedside. Disposition Plan: Per primary team. Patient is stable to be discharged from a medicine point of view.   Consultants:  [facility]: Dr. Nehemiah Settle 05/29/2014  Procedures:  Acute abdominal series 05/29/2014  Right total Knee arthroplasty 05/27/14  Antibiotics:  Augmentin 05/29/2014  HPI/Subjective: Patient states feeling better. Patient denies any emesis.  Patient tolerating oral intake. Patient states abdominal pain has improved after having bowel movements.  Objective: Filed Vitals:   05/31/14 0536  BP: 113/78  Pulse: 84  Temp: 98.7 F (37.1 C)  Resp: 16    Intake/Output Summary (Last 24 hours) at 05/31/14 1143 Last data filed at 05/31/14 0942  Gross per 24 hour  Intake    590 ml  Output   2500 ml  Net  -1910 ml   Filed Weights   05/27/14 0543  Weight: 80.287 kg (177 lb)    Exam:   General:  NAD  Cardiovascular: RRR  Respiratory: CTA B.  Abdomen: Soft, less tenderness in the suprapubic region and lower abdominal region, nondistended, positive bowel sounds  Musculoskeletal: No clubbing cyanosis or edema  Data Reviewed: Basic Metabolic Panel:  Recent Labs Lab 05/29/14 1058 05/29/14 2218 05/30/14 0519 05/30/14 1326 05/31/14 0455  NA 121* 119* 122* 129* 134*  K 2.9* 3.3* 3.6* 3.7 4.6  CL 79* 80* 84* 89* 97  CO2 29 27 27 28 28   GLUCOSE 134* 158* 138* 167* 113*  BUN 5* 3* 3* 3* 5*  CREATININE 0.61 0.54 0.61 0.58 0.69  CALCIUM 8.6 8.9 9.0 9.0 8.9  MG  --  1.5  --   --  2.6*   Liver Function Tests: No results found for this basename: AST, ALT, ALKPHOS, BILITOT, PROT, ALBUMIN,  in the last 168 hours No results found for this basename: LIPASE, AMYLASE,  in the last 168 hours No results found for this basename: AMMONIA,  in the last 168 hours CBC:  Recent Labs Lab 05/28/14 0432 05/29/14 0446 05/30/14 0519 05/31/14 0455  WBC 10.5 9.3 7.1 5.5  HGB 12.0 11.6* 11.6* 11.6*  HCT 34.2* 32.3* 31.6* 33.5*  MCV 95.5 92.3 90.3 95.7  PLT 233 221 248 260  Cardiac Enzymes: No results found for this basename: CKTOTAL, CKMB, CKMBINDEX, TROPONINI,  in the last 168 hours BNP (last 3 results) No results found for this basename: PROBNP,  in the last 8760 hours CBG: No results found for this basename: GLUCAP,  in the last 168 hours  No results found for this or any previous visit (from the past 240 hour(s)).    Studies: Dg Abd Acute W/chest  05/29/2014   CLINICAL DATA:  Lower abdominal discomfort and constipation.  EXAM: ACUTE ABDOMEN SERIES (ABDOMEN 2 VIEW & CHEST 1 VIEW)  COMPARISON:  Chest radiograph performed 05/18/2014, and CT of the abdomen and pelvis performed 04/11/2010  FINDINGS: The lungs are well-aerated. Minimal bibasilar atelectasis is noted. There is no evidence of pleural effusion or pneumothorax. The cardiomediastinal silhouette is within normal limits.  The visualized bowel gas pattern is unremarkable. Scattered stool and air are seen within the colon; there is no evidence of small bowel dilatation to suggest obstruction. No free intra-abdominal air is identified on the provided upright view.  No acute osseous abnormalities are seen; the sacroiliac joints are unremarkable in appearance.  IMPRESSION: 1. Unremarkable bowel gas pattern; no free intra-abdominal air seen. 2. Minimal bibasilar atelectasis noted.   Electronically Signed   By: Garald Balding M.D.   On: 05/29/2014 22:31    Scheduled Meds: . amoxicillin-clavulanate  1 tablet Oral Q12H  . docusate sodium  100 mg Oral BID  . estrogen-methylTESTOSTERone  2 tablet Oral q morning - 10a  . losartan  50 mg Oral QPM  . polyethylene glycol  17 g Oral BID  . rivaroxaban  10 mg Oral Q breakfast  . senna-docusate  1 tablet Oral QHS   Continuous Infusions:   Principal Problem:   Right knee DJD Active Problems:   Hyponatremia   Hypokalemia   Abdominal pain, left lower quadrant    Time spent: 35 minutes    [facility] MD [facility] Pager [phone]. If 7PM-7AM, please contact night-coverage at [email or web address], password [email or web address] 05/31/2014, 11:43 AM  LOS: 4 days

## 2014-05-31 NOTE — Discharge Summary (Signed)
Physician Discharge Summary   Patient ID: Laura Hahn MRN: [id number] DOB/AGE: [birth year] 63 y.o.  Admit date: 05/27/2014 Discharge date:   Primary Diagnosis: right knee DJD  Admission Diagnoses:  Past Medical History  Diagnosis Date  . Family history of anesthesia complication     mother-nausea  . Hypertension   . IBS (irritable bowel syndrome)   . Diverticulosis   . Arthritis    Discharge Diagnoses:   Principal Problem:   Right knee DJD Active Problems:   Hyponatremia   Hypokalemia   Abdominal pain, left lower quadrant  Estimated body mass index is 26.92 kg/(m^2) as calculated from the following:   Height as of this encounter: _0  (1.727 m).   Weight as of this encounter: 80.287 kg (177 lb).  Procedure:  Procedure(s) (LRB): TOTAL RIGHT KNEE ARTHROPLASTY (Right)   Consults: hospitalists- for hyponatremia, hypokalemia, and abdominal pain  HPI: see H&P Laboratory Data: Admission on 05/27/2014  Component Date Value Ref Range Status  . WBC 05/28/2014 10.5  4.0 - 10.5 K/uL Final  . RBC 05/28/2014 3.58* 3.87 - 5.11 MIL/uL Final  . Hemoglobin 05/28/2014 12.0  12.0 - 15.0 g/dL Final  . HCT 05/28/2014 34.2* 36.0 - 46.0 % Final  . MCV 05/28/2014 95.5  78.0 - 100.0 fL Final  . MCH 05/28/2014 33.5  26.0 - 34.0 pg Final  . MCHC 05/28/2014 35.1  30.0 - 36.0 g/dL Final  . RDW 05/28/2014 11.5  11.5 - 15.5 % Final  . Platelets 05/28/2014 233  150 - 400 K/uL Final  . Sodium 05/28/2014 126* 137 - 147 mEq/L Final  . Potassium 05/28/2014 3.2* 3.7 - 5.3 mEq/L Final  . Chloride 05/28/2014 87* 96 - 112 mEq/L Final  . CO2 05/28/2014 26  19 - 32 mEq/L Final  . Glucose, Bld 05/28/2014 122* 70 - 99 mg/dL Final  . BUN 05/28/2014 9  6 - 23 mg/dL Final  . Creatinine, Ser 05/28/2014 0.73  0.50 - 1.10 mg/dL Final  . Calcium 05/28/2014 8.2* 8.4 - 10.5 mg/dL Final  . GFR calc non Af Amer 05/28/2014 >90  >90 mL/min Final  . GFR calc Af Amer 05/28/2014 >90  >90 mL/min Final   Comment:  (NOTE)                          The eGFR has been calculated using the CKD EPI equation.                          This calculation has not been validated in all clinical situations.                          eGFR's persistently <90 mL/min signify possible Chronic Kidney                          Disease.  . Anion gap 05/28/2014 13  5 - 15 Final  . WBC 05/29/2014 9.3  4.0 - 10.5 K/uL Final  . RBC 05/29/2014 3.50* 3.87 - 5.11 MIL/uL Final  . Hemoglobin 05/29/2014 11.6* 12.0 - 15.0 g/dL Final  . HCT 05/29/2014 32.3* 36.0 - 46.0 % Final  . MCV 05/29/2014 92.3  78.0 - 100.0 fL Final  . MCH 05/29/2014 33.1  26.0 - 34.0 pg Final  . MCHC 05/29/2014 35.9  30.0 - 36.0 g/dL Final  . RDW 05/29/2014 11.3*  11.5 - 15.5 % Final  . Platelets 05/29/2014 221  150 - 400 K/uL Final  . Sodium 05/29/2014 121* 137 - 147 mEq/L Final   Comment: CRITICAL RESULT CALLED TO, READ BACK BY AND VERIFIED WITH:                          T.COUNCIL RN AT 9233 ON 12SEP15 BY C.BONGEL  . Potassium 05/29/2014 2.9* 3.7 - 5.3 mEq/L Final   Comment: CRITICAL RESULT CALLED TO, READ BACK BY AND VERIFIED WITH:                          T.COUNCIL RN AT 0076 ON 12SEP15 BY C.BONGEL  . Chloride 05/29/2014 79* 96 - 112 mEq/L Final   Comment: DELTA CHECK NOTED                          REPEATED TO VERIFY  . CO2 05/29/2014 29  19 - 32 mEq/L Final  . Glucose, Bld 05/29/2014 134* 70 - 99 mg/dL Final  . BUN 05/29/2014 5* 6 - 23 mg/dL Final  . Creatinine, Ser 05/29/2014 0.61  0.50 - 1.10 mg/dL Final  . Calcium 05/29/2014 8.6  8.4 - 10.5 mg/dL Final  . GFR calc non Af Amer 05/29/2014 >90  >90 mL/min Final  . GFR calc Af Amer 05/29/2014 >90  >90 mL/min Final   Comment: (NOTE)                          The eGFR has been calculated using the CKD EPI equation.                          This calculation has not been validated in all clinical situations.                          eGFR's persistently <90 mL/min signify possible Chronic Kidney                           Disease.  . Anion gap 05/29/2014 13  5 - 15 Final  . Color, Urine 05/29/2014 YELLOW  YELLOW Final  . APPearance 05/29/2014 CLOUDY* CLEAR Final  . Specific Gravity, Urine 05/29/2014 1.008  1.005 - 1.030 Final  . pH 05/29/2014 7.5  5.0 - 8.0 Final  . Glucose, UA 05/29/2014 NEGATIVE  NEGATIVE mg/dL Final  . Hgb urine dipstick 05/29/2014 NEGATIVE  NEGATIVE Final  . Bilirubin Urine 05/29/2014 NEGATIVE  NEGATIVE Final  . Ketones, ur 05/29/2014 NEGATIVE  NEGATIVE mg/dL Final  . Protein, ur 05/29/2014 NEGATIVE  NEGATIVE mg/dL Final  . Urobilinogen, UA 05/29/2014 1.0  0.0 - 1.0 mg/dL Final  . Nitrite 05/29/2014 NEGATIVE  NEGATIVE Final  . Leukocytes, UA 05/29/2014 NEGATIVE  NEGATIVE Final   MICROSCOPIC NOT DONE ON URINES WITH NEGATIVE PROTEIN, BLOOD, LEUKOCYTES, NITRITE, OR GLUCOSE <1000 mg/dL.  . WBC 05/30/2014 7.1  4.0 - 10.5 K/uL Final  . RBC 05/30/2014 3.50* 3.87 - 5.11 MIL/uL Final  . Hemoglobin 05/30/2014 11.6* 12.0 - 15.0 g/dL Final  . HCT 05/30/2014 31.6* 36.0 - 46.0 % Final  . MCV 05/30/2014 90.3  78.0 - 100.0 fL Final  . MCH 05/30/2014 33.1  26.0 - 34.0 pg Final  . MCHC 05/30/2014 36.7* 30.0 -  36.0 g/dL Final  . RDW 05/30/2014 11.0* 11.5 - 15.5 % Final  . Platelets 05/30/2014 248  150 - 400 K/uL Final  . Sodium 05/30/2014 122* 137 - 147 mEq/L Final  . Potassium 05/30/2014 3.6* 3.7 - 5.3 mEq/L Final  . Chloride 05/30/2014 84* 96 - 112 mEq/L Final  . CO2 05/30/2014 27  19 - 32 mEq/L Final  . Glucose, Bld 05/30/2014 138* 70 - 99 mg/dL Final  . BUN 05/30/2014 3* 6 - 23 mg/dL Final  . Creatinine, Ser 05/30/2014 0.61  0.50 - 1.10 mg/dL Final  . Calcium 05/30/2014 9.0  8.4 - 10.5 mg/dL Final  . GFR calc non Af Amer 05/30/2014 >90  >90 mL/min Final  . GFR calc Af Amer 05/30/2014 >90  >90 mL/min Final   Comment: (NOTE)                          The eGFR has been calculated using the CKD EPI equation.                          This calculation has not been validated in all  clinical situations.                          eGFR's persistently <90 mL/min signify possible Chronic Kidney                          Disease.  . Anion gap 05/30/2014 11  5 - 15 Final  . Sodium 05/29/2014 119* 137 - 147 mEq/L Final   Comment: CRITICAL RESULT CALLED TO, READ BACK BY AND VERIFIED WITH:                          K.LUTTERLOH,RN AT 2256 ON 05/29/14 BY SHEAW  . Potassium 05/29/2014 3.3* 3.7 - 5.3 mEq/L Final  . Chloride 05/29/2014 80* 96 - 112 mEq/L Final  . CO2 05/29/2014 27  19 - 32 mEq/L Final  . Glucose, Bld 05/29/2014 158* 70 - 99 mg/dL Final  . BUN 05/29/2014 3* 6 - 23 mg/dL Final  . Creatinine, Ser 05/29/2014 0.54  0.50 - 1.10 mg/dL Final  . Calcium 05/29/2014 8.9  8.4 - 10.5 mg/dL Final  . GFR calc non Af Amer 05/29/2014 >90  >90 mL/min Final  . GFR calc Af Amer 05/29/2014 >90  >90 mL/min Final   Comment: (NOTE)                          The eGFR has been calculated using the CKD EPI equation.                          This calculation has not been validated in all clinical situations.                          eGFR's persistently <90 mL/min signify possible Chronic Kidney                          Disease.  . Anion gap 05/29/2014 12  5 - 15 Final  . Osmolality 05/29/2014 241* 275 - 300 mOsm/kg Final   Comment: REPEATED TO VERIFY  CRITICAL RESULT CALLED TO, READ BACK BY AND VERIFIED WITH:                          J LUTTERLOW AT 0312 ON 09.13.2015 BY NBROOKS                          (NOTE)                          Result repeated and verified.                          Amended report.  . Osmolality, Ur 05/29/2014 147* 390 - 1090 mOsm/kg Final   Performed at [facility]  . Sodium, Ur 05/29/2014 46   Final  . Potassium Urine Timed 05/29/2014 6   Final   Performed at [facility]  . Magnesium 05/29/2014 1.5  1.5 - 2.5 mg/dL Final  . Sodium, Ur 05/30/2014 <20   Final   Performed at [facility]  . Creatinine, Urine 05/30/2014  21.76   Final   Performed at [facility]  . TSH 05/30/2014 1.340  0.350 - 4.500 uIU/mL Final   Performed at [facility]  . Cortisol, Plasma 05/30/2014 25.9   Final   Comment: (NOTE)                          AM:  4.3 - 22.4 ug/dL                          PM:  3.1 - 16.7 ug/dL                          Performed at [facility]  . Sodium 05/30/2014 129* 137 - 147 mEq/L Final   DELTA CHECK NOTED  . Potassium 05/30/2014 3.7  3.7 - 5.3 mEq/L Final  . Chloride 05/30/2014 89* 96 - 112 mEq/L Final  . CO2 05/30/2014 28  19 - 32 mEq/L Final  . Glucose, Bld 05/30/2014 167* 70 - 99 mg/dL Final  . BUN 05/30/2014 3* 6 - 23 mg/dL Final  . Creatinine, Ser 05/30/2014 0.58  0.50 - 1.10 mg/dL Final  . Calcium 05/30/2014 9.0  8.4 - 10.5 mg/dL Final  . GFR calc non Af Amer 05/30/2014 >90  >90 mL/min Final  . GFR calc Af Amer 05/30/2014 >90  >90 mL/min Final   Comment: (NOTE)                          The eGFR has been calculated using the CKD EPI equation.                          This calculation has not been validated in all clinical situations.                          eGFR's persistently <90 mL/min signify possible Chronic Kidney                          Disease.  . Anion gap 05/30/2014 12  5 - 15 Final  . Sodium 05/31/2014  134* 137 - 147 mEq/L Final  . Potassium 05/31/2014 4.6  3.7 - 5.3 mEq/L Final   Comment: DELTA CHECK NOTED                          NO VISIBLE HEMOLYSIS                          REPEATED TO VERIFY  . Chloride 05/31/2014 97  96 - 112 mEq/L Final   Comment: DELTA CHECK NOTED                          REPEATED TO VERIFY  . CO2 05/31/2014 28  19 - 32 mEq/L Final  . Glucose, Bld 05/31/2014 113* 70 - 99 mg/dL Final  . BUN 05/31/2014 5* 6 - 23 mg/dL Final  . Creatinine, Ser 05/31/2014 0.69  0.50 - 1.10 mg/dL Final  . Calcium 05/31/2014 8.9  8.4 - 10.5 mg/dL Final  . GFR calc non Af Amer 05/31/2014 >90  >90 mL/min Final  . GFR calc Af Amer 05/31/2014 >90  >90  mL/min Final   Comment: (NOTE)                          The eGFR has been calculated using the CKD EPI equation.                          This calculation has not been validated in all clinical situations.                          eGFR's persistently <90 mL/min signify possible Chronic Kidney                          Disease.  . Anion gap 05/31/2014 9  5 - 15 Final  . WBC 05/31/2014 5.5  4.0 - 10.5 K/uL Final  . RBC 05/31/2014 3.50* 3.87 - 5.11 MIL/uL Final  . Hemoglobin 05/31/2014 11.6* 12.0 - 15.0 g/dL Final  . HCT 05/31/2014 33.5* 36.0 - 46.0 % Final  . MCV 05/31/2014 95.7  78.0 - 100.0 fL Final  . MCH 05/31/2014 33.1  26.0 - 34.0 pg Final  . MCHC 05/31/2014 34.6  30.0 - 36.0 g/dL Final  . RDW 05/31/2014 11.7  11.5 - 15.5 % Final  . Platelets 05/31/2014 260  150 - 400 K/uL Final  . Magnesium 05/31/2014 2.6* 1.5 - 2.5 mg/dL Final  Hospital Outpatient Visit on 05/18/2014  Component Date Value Ref Range Status  . Sodium 05/18/2014 139  137 - 147 mEq/L Final  . Potassium 05/18/2014 4.1  3.7 - 5.3 mEq/L Final  . Chloride 05/18/2014 99  96 - 112 mEq/L Final  . CO2 05/18/2014 25  19 - 32 mEq/L Final  . Glucose, Bld 05/18/2014 110* 70 - 99 mg/dL Final  . BUN 05/18/2014 15  6 - 23 mg/dL Final  . Creatinine, Ser 05/18/2014 0.82  0.50 - 1.10 mg/dL Final  . Calcium 05/18/2014 10.1  8.4 - 10.5 mg/dL Final  . GFR calc non Af Amer 05/18/2014 76* >90 mL/min Final  . GFR calc Af Amer 05/18/2014 88* >90 mL/min Final   Comment: (NOTE)  The eGFR has been calculated using the CKD EPI equation.                          This calculation has not been validated in all clinical situations.                          eGFR's persistently <90 mL/min signify possible Chronic Kidney                          Disease.  . Anion gap 05/18/2014 15  5 - 15 Final  . WBC 05/18/2014 6.9  4.0 - 10.5 K/uL Final  . RBC 05/18/2014 4.60  3.87 - 5.11 MIL/uL Final  . Hemoglobin 05/18/2014 15.6* 12.0 -  15.0 g/dL Final  . HCT 05/18/2014 45.0  36.0 - 46.0 % Final  . MCV 05/18/2014 97.8  78.0 - 100.0 fL Final  . MCH 05/18/2014 33.9  26.0 - 34.0 pg Final  . MCHC 05/18/2014 34.7  30.0 - 36.0 g/dL Final  . RDW 05/18/2014 11.8  11.5 - 15.5 % Final  . Platelets 05/18/2014 240  150 - 400 K/uL Final  . Prothrombin Time 05/18/2014 12.7  11.6 - 15.2 seconds Final  . INR 05/18/2014 0.95  0.00 - 1.49 Final  . ABO/RH(D) 05/18/2014 A POS   Final  . Antibody Screen 05/18/2014 NEG   Final  . Sample Expiration 05/18/2014 05/30/2014   Final  . Color, Urine 05/18/2014 YELLOW  YELLOW Final  . APPearance 05/18/2014 CLEAR  CLEAR Final  . Specific Gravity, Urine 05/18/2014 1.010  1.005 - 1.030 Final  . pH 05/18/2014 8.0  5.0 - 8.0 Final  . Glucose, UA 05/18/2014 NEGATIVE  NEGATIVE mg/dL Final  . Hgb urine dipstick 05/18/2014 NEGATIVE  NEGATIVE Final  . Bilirubin Urine 05/18/2014 NEGATIVE  NEGATIVE Final  . Ketones, ur 05/18/2014 NEGATIVE  NEGATIVE mg/dL Final  . Protein, ur 05/18/2014 NEGATIVE  NEGATIVE mg/dL Final  . Urobilinogen, UA 05/18/2014 0.2  0.0 - 1.0 mg/dL Final  . Nitrite 05/18/2014 NEGATIVE  NEGATIVE Final  . Leukocytes, UA 05/18/2014 NEGATIVE  NEGATIVE Final   MICROSCOPIC NOT DONE ON URINES WITH NEGATIVE PROTEIN, BLOOD, LEUKOCYTES, NITRITE, OR GLUCOSE <1000 mg/dL.  Marland Kitchen MRSA, PCR 05/18/2014 NEGATIVE  NEGATIVE Final  . Staphylococcus aureus 05/18/2014 NEGATIVE  NEGATIVE Final   Comment:                                 The Xpert SA Assay (FDA                          approved for NASAL specimens                          in patients over 63 years of age),                          is one component of                          a comprehensive surveillance                          program.  Test performance has                          been validated by [facility] for patients greater                          than or equal to 53 year old.                          It is not  intended                          to diagnose infection nor to                          guide or monitor treatment.  . ABO/RH(D) 05/18/2014 A POS   Final  . aPTT 05/18/2014 29  24 - 37 seconds Final     X-Rays:Dg Chest 2 View  05/18/2014   CLINICAL DATA:  Hypertension  EXAM: CHEST  2 VIEW  COMPARISON:  Chest radiograph 05/28/2005  FINDINGS: Stable cardiac and mediastinal contours. Mild tortuosity thoracic aorta. No consolidative pulmonary opacities. No pleural effusion or pneumothorax. Regional skeleton is unremarkable.  IMPRESSION: No acute cardiopulmonary process.   Electronically Signed   By: Lovey Newcomer M.D.   On: 05/18/2014 13:06   Dg Knee 1-2 Views Right  05/27/2014   CLINICAL DATA:  Status post right TKA  EXAM: RIGHT KNEE - 1-2 VIEW  COMPARISON:  None.  FINDINGS: The right knee demonstrates a total knee arthroplasty without evidence of hardware failure complication. There is no significant joint effusion. There is no fracture or dislocation. The alignment is anatomic. Surgical drains are present. Post-surgical changes noted in the surrounding soft tissues.  IMPRESSION: Right total knee arthroplasty.   Electronically Signed   By: Kathreen Devoid   On: 05/27/2014 11:21   Dg Knee 1-2 Views Right  05/18/2014   CLINICAL DATA:  Preop total knee.  Pain.  EXAM: RIGHT KNEE - 1-2 VIEW  COMPARISON:  None  FINDINGS: There is significant joint space narrowing involving medial, lateral, and patellofemoral compartments. Note is made of chondrocalcinosis. Small joint effusion is present. No acute fracture. No suspicious lytic or blastic lesions.  IMPRESSION: 1. Significant degenerative change. 2. Small joint effusion.   Electronically Signed   By: Shon Hale M.D.   On: 05/18/2014 13:06   Dg Abd Acute W/chest  05/29/2014   CLINICAL DATA:  Lower abdominal discomfort and constipation.  EXAM: ACUTE ABDOMEN SERIES (ABDOMEN 2 VIEW & CHEST 1 VIEW)  COMPARISON:  Chest radiograph performed 05/18/2014, and CT of the  abdomen and pelvis performed 04/11/2010  FINDINGS: The lungs are well-aerated. Minimal bibasilar atelectasis is noted. There is no evidence of pleural effusion or pneumothorax. The cardiomediastinal silhouette is within normal limits.  The visualized bowel gas pattern is unremarkable. Scattered stool and air are seen within the colon; there is no evidence of small bowel dilatation to suggest obstruction. No free intra-abdominal air is identified on the provided upright view.  No acute osseous abnormalities are seen; the sacroiliac joints are unremarkable in appearance.  IMPRESSION: 1. Unremarkable bowel gas pattern; no free intra-abdominal air seen. 2. Minimal  bibasilar atelectasis noted.   Electronically Signed   By: Garald Balding M.D.   On: 05/29/2014 22:31    EKG:No orders found for this or any previous visit.   Hospital Course: Laura Hahn is a 63 y.o. who was admitted to [facility]. They were brought to the operating room on 05/27/2014 and underwent Procedure(s): TOTAL RIGHT KNEE ARTHROPLASTY.  Patient tolerated the procedure well and was later transferred to the recovery room and then to the orthopaedic floor for postoperative care.  They were given PO and IV analgesics for pain control following their surgery.  They were given 24 hours of postoperative antibiotics of  Anti-infectives   Start     Dose/Rate Route Frequency Ordered Stop   05/31/14 0000  amoxicillin-clavulanate (AUGMENTIN) 875-125 MG per tablet     1 tablet Oral Every 12 hours 05/31/14 1152 06/04/14 2359   05/29/14 2200  amoxicillin-clavulanate (AUGMENTIN) 875-125 MG per tablet 1 tablet     1 tablet Oral Every 12 hours 05/29/14 1749     05/27/14 1400  ceFAZolin (ANCEF) IVPB 2 g/50 mL premix     2 g 100 mL/hr over 30 Minutes Intravenous Every 6 hours 05/27/14 1204 05/28/14 0251   05/27/14 0822  polymyxin B 500,000 Units, bacitracin 50,000 Units in sodium chloride irrigation 0.9 % 500 mL irrigation  Status:   Discontinued       As needed 05/27/14 0823 05/27/14 1013   05/27/14 0600  ceFAZolin (ANCEF) IVPB 2 g/50 mL premix     2 g 100 mL/hr over 30 Minutes Intravenous On call to O.R. 05/27/14 0540 05/27/14 0752     and started on DVT prophylaxis in the form of Xarelto, TED hose and SCDs.   PT and OT were ordered for total joint protocol.  Discharge planning consulted to help with postop disposition and equipment needs.  Patient had a difficult night on the evening of surgery.  They started to get up OOB with therapy on day one. Hemovac drain was pulled without difficulty.  Continued to work with therapy into day two but was very limited due to abdominal pain and nausea. Hospitalist team was consulted for further workup of her abdominal pain (hx diverticulitis) and hyponatremia, hypokalemia. She did eventually have a BM on POD 3 and has noted improvement of abdominal pain since, was started on ppx abx by hospitalists for possible diverticulitis which she will complete a thome. Hypokalemia resolved prior to D/C and hyponatremia improved. She will follow up with her PCP outpatient after D/C per hospitalists. By day four, the patient had progressed with therapy and meeting their goals.  Incision was healing well.  Patient was seen in rounds and was ready to go home.   Diet: low sodium heart healthy Activity:WBAT Follow-up:in 10 days Disposition - Home with HHPT Discharged Condition: good   Discharge Instructions   Call MD / Call 911    Complete by:  As directed   If you experience chest pain or shortness of breath, CALL 911 and be transported to the hospital emergency room.  If you develope a fever above 101 F, pus (white drainage) or increased drainage or redness at the wound, or calf pain, call your surgeon's office.     Constipation Prevention    Complete by:  As directed   Drink plenty of fluids.  Prune juice may be helpful.  You may use a stool softener, such as Colace (over the counter) 100 mg twice  a day.  Use  MiraLax (over the counter) for constipation as needed.     Diet - low sodium heart healthy    Complete by:  As directed      Increase activity slowly as tolerated    Complete by:  As directed             Medication List    STOP taking these medications       triamterene-hydrochlorothiazide 75-50 MG per tablet  Commonly known as:  MAXZIDE      TAKE these medications       amoxicillin-clavulanate 875-125 MG per tablet  Commonly known as:  AUGMENTIN  Take 1 tablet by mouth every 12 (twelve) hours. Take for 3 days then stop.     docusate sodium 100 MG capsule  Commonly known as:  COLACE  Take 1 capsule (100 mg total) by mouth 2 (two) times daily as needed for mild constipation.     estrogen-methylTESTOSTERone 1.25-2.5 MG per tablet  Commonly known as:  ESTRATEST  Take 1 tablet by mouth every morning.     losartan 50 MG tablet  Commonly known as:  COZAAR  Take 50 mg by mouth every evening.     methocarbamol 500 MG tablet  Commonly known as:  ROBAXIN  Take 1 tablet (500 mg total) by mouth 3 (three) times daily between meals as needed for muscle spasms.     oxyCODONE-acetaminophen 7.5-325 MG per tablet  Commonly known as:  PERCOCET  Take 1-2 tablets by mouth every 4 (four) hours as needed for pain.     polyethylene glycol packet  Commonly known as:  MIRALAX / GLYCOLAX  Take 17 g by mouth 2 (two) times daily. Hold if develop diarrhea     rivaroxaban 10 MG Tabs tablet  Commonly known as:  XARELTO  Take 1 tablet (10 mg total) by mouth daily.     senna-docusate 8.6-50 MG per tablet  Commonly known as:  Senokot-S  Take 1 tablet by mouth at bedtime. Hold if develop diarrhea           Follow-up Information   Follow up with BEANE,JEFFREY C, MD In 2 weeks.   Specialty:  Orthopedic Surgery   Contact information:   [address] [place] [address] [phone]       Follow up with [facility])      Contact information:   [place] [address] [phone]       Follow up with [facility], MD. Schedule an appointment as soon as possible for a visit in 1 week. (F/U WITH PCP for BMET)    Specialty:  Family Medicine   Contact information:   [place] [address] [phone]       Signed: Lacie Draft, PA-C Orthopaedic Surgery 05/31/2014, 12:44 PM

## 2014-06-01 LAB — URINE CULTURE
Colony Count: NO GROWTH
Culture: NO GROWTH

## 2014-12-17 HISTORY — PX: MASTOPEXY: SHX5358

## 2015-06-10 IMAGING — DX DG KNEE 1-2V*R*
2 series · 2 of 2 positions shown · non-contrast
Comparison: None.

CLINICAL DATA: Status post right TKA

EXAM:
RIGHT KNEE - 1-2 VIEW

[knee ap]
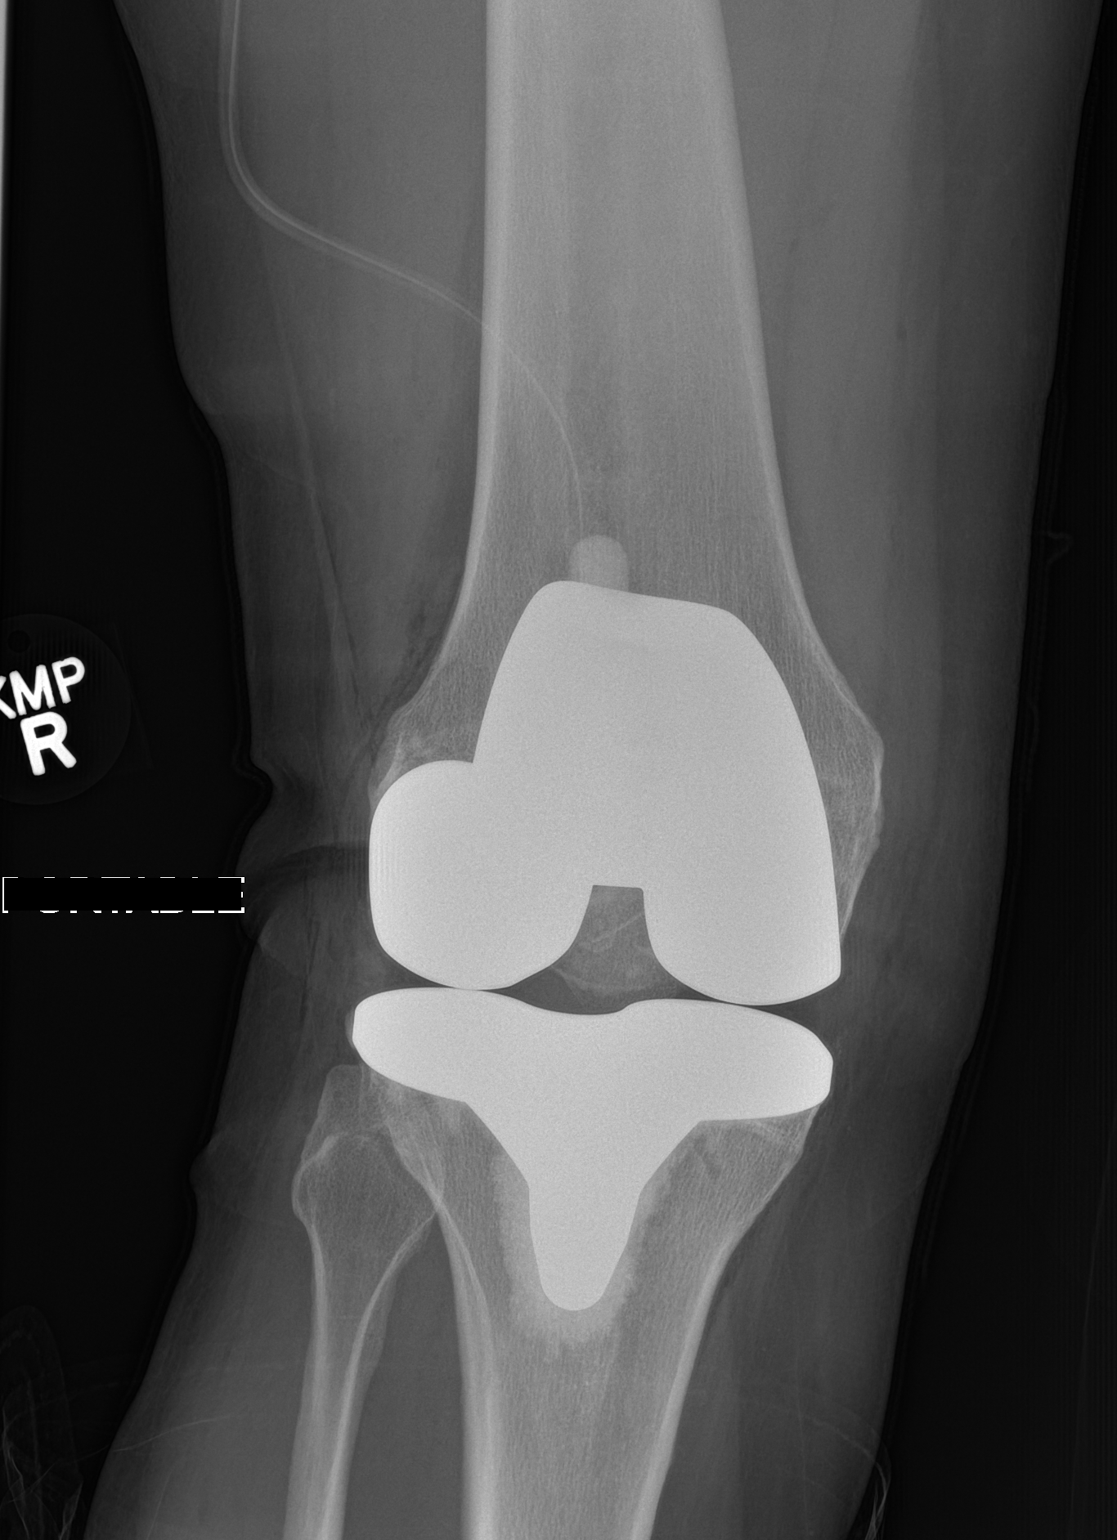

[knee lat]
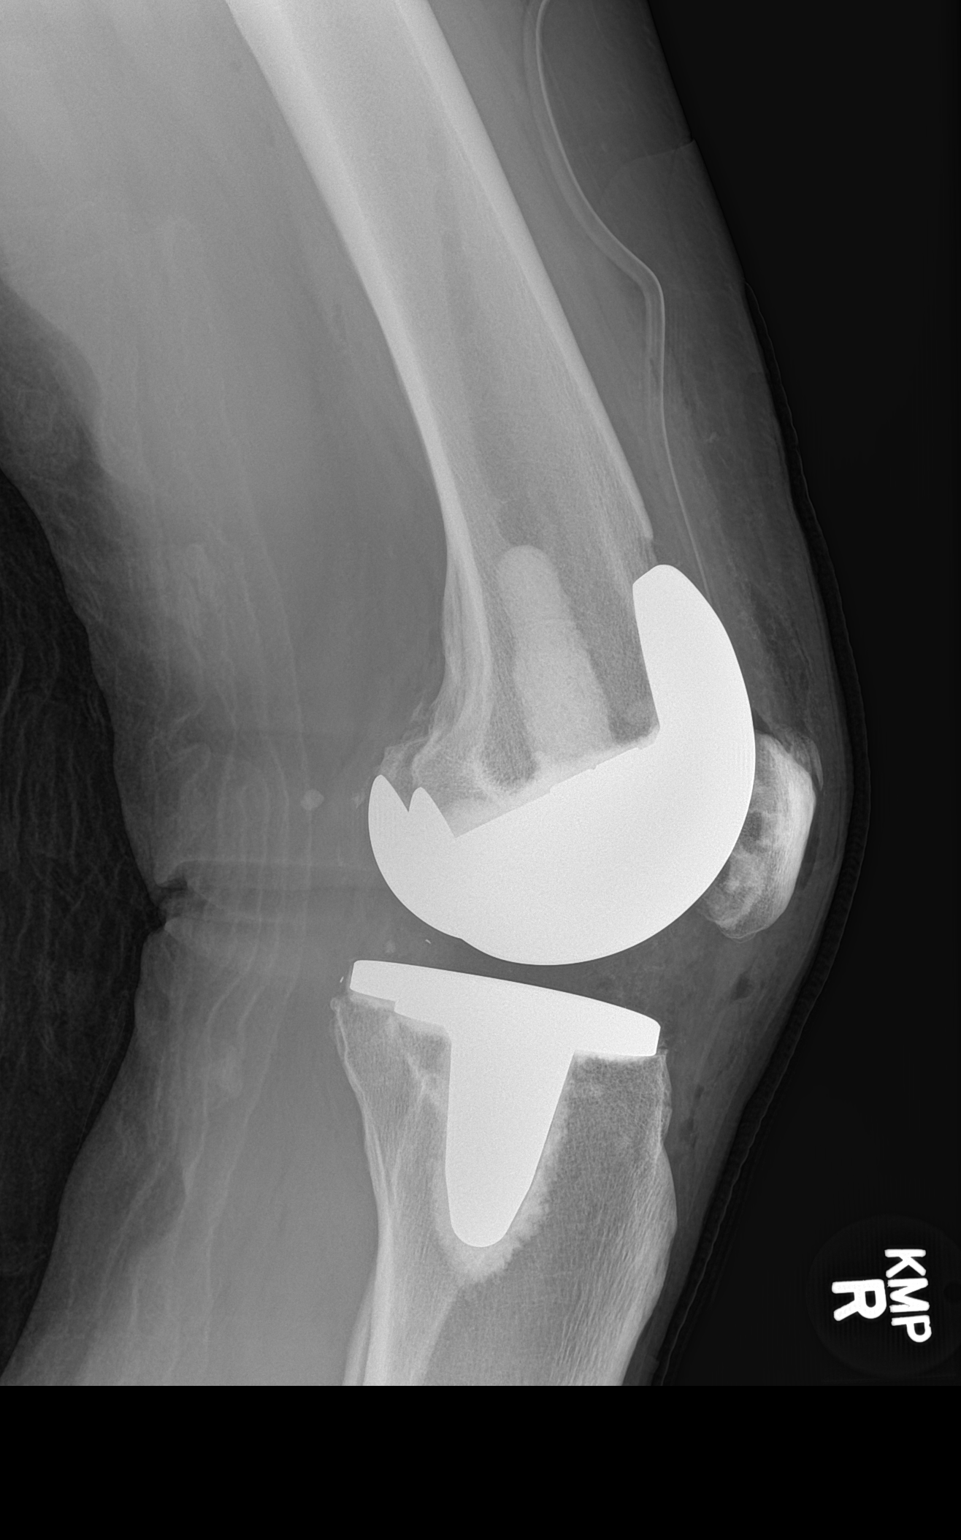

[2 of 2 positions shown; findings below may reference images not displayed]

FINDINGS: The right knee demonstrates a total knee arthroplasty without
evidence of hardware failure complication. There is no significant
joint effusion. There is no fracture or dislocation. The alignment
is anatomic. Surgical drains are present. Post-surgical changes
noted in the surrounding soft tissues.
IMPRESSION: Right total knee arthroplasty.

## 2015-06-18 DIAGNOSIS — R2232 Localized swelling, mass and lump, left upper limb: Secondary | ICD-10-CM

## 2015-06-18 HISTORY — DX: Localized swelling, mass and lump, left upper limb: R22.32

## 2015-06-27 ENCOUNTER — Encounter (HOSPITAL_BASED_OUTPATIENT_CLINIC_OR_DEPARTMENT_OTHER): Payer: Self-pay | Admitting: *Deleted

## 2015-06-27 NOTE — Pre-Procedure Instructions (Signed)
To come for BMET and EKG 

## 2015-07-01 ENCOUNTER — Ambulatory Visit: Payer: Self-pay | Admitting: Specialist

## 2015-07-01 ENCOUNTER — Encounter (HOSPITAL_BASED_OUTPATIENT_CLINIC_OR_DEPARTMENT_OTHER)
Admission: RE | Admit: 2015-07-01 | Discharge: 2015-07-01 | Disposition: A | Discharge status: Home / Self Care | Payer: BLUE CROSS/BLUE SHIELD | Source: Ambulatory Visit | Attending: Specialist | Admitting: Specialist

## 2015-07-01 ENCOUNTER — Other Ambulatory Visit: Payer: Self-pay | Admitting: Specialist

## 2015-07-01 DIAGNOSIS — I10 Essential (primary) hypertension: Secondary | ICD-10-CM | POA: Diagnosis not present

## 2015-07-01 DIAGNOSIS — D2112 Benign neoplasm of connective and other soft tissue of left upper limb, including shoulder: Secondary | ICD-10-CM | POA: Diagnosis not present

## 2015-07-01 DIAGNOSIS — Z7982 Long term (current) use of aspirin: Secondary | ICD-10-CM | POA: Diagnosis not present

## 2015-07-01 DIAGNOSIS — M1388 Other specified arthritis, other site: Secondary | ICD-10-CM | POA: Diagnosis not present

## 2015-07-01 LAB — BASIC METABOLIC PANEL
Anion gap: 7 (ref 5–15)
BUN: 20 mg/dL (ref 6–20)
CHLORIDE: 99 mmol/L — AB (ref 101–111)
CO2: 29 mmol/L (ref 22–32)
CREATININE: 0.89 mg/dL (ref 0.44–1.00)
Calcium: 9.4 mg/dL (ref 8.9–10.3)
GFR calc Af Amer: >60 mL/min (ref >60)
GFR calc non Af Amer: >60 mL/min (ref >60)
Glucose, Bld: 97 mg/dL (ref 65–99)
Potassium: 4.7 mmol/L (ref 3.5–5.1)
Sodium: 135 mmol/L (ref 135–145)

## 2015-07-04 ENCOUNTER — Ambulatory Visit (HOSPITAL_BASED_OUTPATIENT_CLINIC_OR_DEPARTMENT_OTHER): Payer: BLUE CROSS/BLUE SHIELD | Admitting: Anesthesiology

## 2015-07-04 ENCOUNTER — Ambulatory Visit (HOSPITAL_BASED_OUTPATIENT_CLINIC_OR_DEPARTMENT_OTHER)
Admission: RE | Admit: 2015-07-04 | Discharge: 2015-07-04 | Disposition: A | Discharge status: Home / Self Care | Payer: BLUE CROSS/BLUE SHIELD | Source: Ambulatory Visit | Attending: Specialist | Admitting: Specialist

## 2015-07-04 ENCOUNTER — Encounter (HOSPITAL_BASED_OUTPATIENT_CLINIC_OR_DEPARTMENT_OTHER)
Admission: RE | Disposition: A | Discharge status: Home / Self Care | Payer: Self-pay | Source: Ambulatory Visit | Attending: Specialist

## 2015-07-04 ENCOUNTER — Encounter (HOSPITAL_BASED_OUTPATIENT_CLINIC_OR_DEPARTMENT_OTHER): Payer: Self-pay

## 2015-07-04 DIAGNOSIS — D2112 Benign neoplasm of connective and other soft tissue of left upper limb, including shoulder: Secondary | ICD-10-CM | POA: Insufficient documentation

## 2015-07-04 DIAGNOSIS — Z7982 Long term (current) use of aspirin: Secondary | ICD-10-CM | POA: Insufficient documentation

## 2015-07-04 DIAGNOSIS — I10 Essential (primary) hypertension: Secondary | ICD-10-CM | POA: Insufficient documentation

## 2015-07-04 DIAGNOSIS — M1388 Other specified arthritis, other site: Secondary | ICD-10-CM | POA: Insufficient documentation

## 2015-07-04 HISTORY — DX: Localized swelling, mass and lump, left upper limb: R22.32

## 2015-07-04 HISTORY — DX: Unspecified osteoarthritis, unspecified site: M19.90

## 2015-07-04 HISTORY — PX: MASS EXCISION: SHX2000

## 2015-07-04 HISTORY — PX: SKIN FULL THICKNESS GRAFT: SHX442

## 2015-07-04 SURGERY — EXCISION MASS
Anesthesia: General | Site: Groin | Laterality: Left

## 2015-07-04 MED ORDER — LIDOCAINE-EPINEPHRINE 0.5 %-1:200000 IJ SOLN
INTRAMUSCULAR | Status: AC
  Filled 2015-07-04: qty 1

## 2015-07-04 MED ORDER — HYDROMORPHONE HCL 1 MG/ML IJ SOLN
0.2500 mg | INTRAMUSCULAR | Status: DC | PRN
  Administered 2015-07-04 (×3): 0.5 mg via INTRAVENOUS

## 2015-07-04 MED ORDER — DEXAMETHASONE SODIUM PHOSPHATE 10 MG/ML IJ SOLN
INTRAMUSCULAR | Status: DC | PRN
  Administered 2015-07-04: 10 mg via INTRAVENOUS

## 2015-07-04 MED ORDER — FENTANYL CITRATE (PF) 100 MCG/2ML IJ SOLN
INTRAMUSCULAR | Status: AC
  Filled 2015-07-04: qty 4

## 2015-07-04 MED ORDER — OXYCODONE HCL 5 MG/5ML PO SOLN: 5.0000 mg | Freq: Once | ORAL | Status: DC | PRN

## 2015-07-04 MED ORDER — MINERAL OIL LIGHT 100 % EX OIL
TOPICAL_OIL | CUTANEOUS | Status: AC
  Filled 2015-07-04: qty 25

## 2015-07-04 MED ORDER — CEFAZOLIN SODIUM-DEXTROSE 2-3 GM-% IV SOLR
2.0000 g | INTRAVENOUS | Status: AC
  Administered 2015-07-04: 2 g via INTRAVENOUS

## 2015-07-04 MED ORDER — HYDROMORPHONE HCL 1 MG/ML IJ SOLN
INTRAMUSCULAR | Status: AC
  Filled 2015-07-04: qty 1

## 2015-07-04 MED ORDER — MIDAZOLAM HCL 2 MG/2ML IJ SOLN
INTRAMUSCULAR | Status: AC
  Filled 2015-07-04: qty 4

## 2015-07-04 MED ORDER — ONDANSETRON HCL 4 MG/2ML IJ SOLN
INTRAMUSCULAR | Status: DC | PRN
  Administered 2015-07-04: 4 mg via INTRAVENOUS

## 2015-07-04 MED ORDER — PROPOFOL 10 MG/ML IV BOLUS
INTRAVENOUS | Status: DC | PRN
  Administered 2015-07-04: 170 mg via INTRAVENOUS

## 2015-07-04 MED ORDER — LACTATED RINGERS IV SOLN
INTRAVENOUS | Status: DC
  Administered 2015-07-04 (×2): via INTRAVENOUS

## 2015-07-04 MED ORDER — MIDAZOLAM HCL 2 MG/2ML IJ SOLN
1.0000 mg | INTRAMUSCULAR | Status: DC | PRN
  Administered 2015-07-04: 2 mg via INTRAVENOUS

## 2015-07-04 MED ORDER — BUPIVACAINE HCL (PF) 0.25 % IJ SOLN
INTRAMUSCULAR | Status: AC
  Filled 2015-07-04: qty 30

## 2015-07-04 MED ORDER — SCOPOLAMINE 1 MG/3DAYS TD PT72: 1.0000 | MEDICATED_PATCH | Freq: Once | TRANSDERMAL | Status: DC | PRN

## 2015-07-04 MED ORDER — EPHEDRINE SULFATE 50 MG/ML IJ SOLN
INTRAMUSCULAR | Status: DC | PRN
  Administered 2015-07-04 (×2): 10 mg via INTRAVENOUS

## 2015-07-04 MED ORDER — GLYCOPYRROLATE 0.2 MG/ML IJ SOLN: 0.2000 mg | Freq: Once | INTRAMUSCULAR | Status: DC | PRN

## 2015-07-04 MED ORDER — LIDOCAINE-EPINEPHRINE 0.5 %-1:200000 IJ SOLN
INTRAMUSCULAR | Status: DC | PRN
  Administered 2015-07-04: 10 mL

## 2015-07-04 MED ORDER — OXYCODONE HCL 5 MG PO TABS: 5.0000 mg | ORAL_TABLET | Freq: Once | ORAL | Status: DC | PRN

## 2015-07-04 MED ORDER — LIDOCAINE HCL (PF) 1 % IJ SOLN
INTRAMUSCULAR | Status: AC
  Filled 2015-07-04: qty 30

## 2015-07-04 MED ORDER — LIDOCAINE HCL (CARDIAC) 20 MG/ML IV SOLN
INTRAVENOUS | Status: AC
  Filled 2015-07-04: qty 5

## 2015-07-04 MED ORDER — ONDANSETRON HCL 4 MG/2ML IJ SOLN: 4.0000 mg | Freq: Once | INTRAMUSCULAR | Status: DC | PRN

## 2015-07-04 MED ORDER — FENTANYL CITRATE (PF) 100 MCG/2ML IJ SOLN
50.0000 ug | INTRAMUSCULAR | Status: DC | PRN
Start: 2015-07-04 — End: 2015-07-04
  Administered 2015-07-04: 100 ug via INTRAVENOUS

## 2015-07-04 MED ORDER — CEFAZOLIN SODIUM-DEXTROSE 2-3 GM-% IV SOLR
INTRAVENOUS | Status: AC
  Filled 2015-07-04: qty 50

## 2015-07-04 MED ORDER — DEXAMETHASONE SODIUM PHOSPHATE 10 MG/ML IJ SOLN
INTRAMUSCULAR | Status: AC
  Filled 2015-07-04: qty 1

## 2015-07-04 MED ORDER — LIDOCAINE HCL (CARDIAC) 20 MG/ML IV SOLN
INTRAVENOUS | Status: DC | PRN
  Administered 2015-07-04: 60 mg via INTRAVENOUS

## 2015-07-04 MED ORDER — ONDANSETRON HCL 4 MG/2ML IJ SOLN
INTRAMUSCULAR | Status: AC
  Filled 2015-07-04: qty 2

## 2015-07-04 SURGICAL SUPPLY — 78 items
BANDAGE ELASTIC 4 VELCRO ST LF (GAUZE/BANDAGES/DRESSINGS) IMPLANT
BANDAGE ELASTIC 6 VELCRO ST LF (GAUZE/BANDAGES/DRESSINGS) IMPLANT
BENZOIN TINCTURE PRP APPL 2/3 (GAUZE/BANDAGES/DRESSINGS) ×4 IMPLANT
BLADE DERMATOME SS (BLADE) ×4 IMPLANT
BLADE HEX COATED 2.75 (ELECTRODE) IMPLANT
BLADE KNIFE PERSONA 10 (BLADE) ×4 IMPLANT
BLADE KNIFE PERSONA 15 (BLADE) ×4 IMPLANT
BLADE SURG 11 STRL SS (BLADE) IMPLANT
BNDG COHESIVE 1X5 TAN STRL LF (GAUZE/BANDAGES/DRESSINGS) ×4 IMPLANT
BNDG COHESIVE 4X5 TAN STRL (GAUZE/BANDAGES/DRESSINGS) IMPLANT
BNDG ESMARK 4X9 LF (GAUZE/BANDAGES/DRESSINGS) ×4 IMPLANT
BNDG GAUZE ELAST 4 BULKY (GAUZE/BANDAGES/DRESSINGS) IMPLANT
BRUSH SCRUB EZ PLAIN DRY (MISCELLANEOUS) IMPLANT
CANISTER SUCT 1200ML W/VALVE (MISCELLANEOUS) IMPLANT
COTTONBALL LRG STERILE PKG (GAUZE/BANDAGES/DRESSINGS) IMPLANT
COVER BACK TABLE 60X90IN (DRAPES) ×4 IMPLANT
COVER MAYO STAND STRL (DRAPES) ×4 IMPLANT
DEPRESSOR TONGUE BLADE STERILE (MISCELLANEOUS) IMPLANT
DERMACARRIERS GRAFT 1 TO 1.5 (DISPOSABLE)
DRAPE EXTREMITY T 121X128X90 (DRAPE) ×4 IMPLANT
DRAPE LAPAROSCOPIC ABDOMINAL (DRAPES) IMPLANT
DRAPE U-SHAPE 76X120 STRL (DRAPES) IMPLANT
DRSG OPSITE 6X11 MED (GAUZE/BANDAGES/DRESSINGS) IMPLANT
DRSG PAD ABDOMINAL 8X10 ST (GAUZE/BANDAGES/DRESSINGS) IMPLANT
ELECT NEEDLE BLADE 2-5/6 (NEEDLE) ×4 IMPLANT
ELECT NEEDLE TIP 2.8 STRL (NEEDLE) IMPLANT
ELECT REM PT RETURN 9FT ADLT (ELECTROSURGICAL) ×4
ELECTRODE REM PT RTRN 9FT ADLT (ELECTROSURGICAL) ×3 IMPLANT
FILTER 7/8 IN (FILTER) ×4 IMPLANT
GAUZE SPONGE 4X4 12PLY STRL (GAUZE/BANDAGES/DRESSINGS) ×4 IMPLANT
GAUZE XEROFORM 1X8 LF (GAUZE/BANDAGES/DRESSINGS) ×4 IMPLANT
GAUZE XEROFORM 5X9 LF (GAUZE/BANDAGES/DRESSINGS) IMPLANT
GLOVE BIO SURGEON STRL SZ 6.5 (GLOVE) ×4 IMPLANT
GLOVE BIOGEL M STRL SZ7.5 (GLOVE) ×4 IMPLANT
GLOVE BIOGEL PI IND STRL 8 (GLOVE) ×3 IMPLANT
GLOVE BIOGEL PI INDICATOR 8 (GLOVE) ×1
GLOVE ECLIPSE 7.0 STRL STRAW (GLOVE) ×4 IMPLANT
GLOVE SURG SS PI 7.5 STRL IVOR (GLOVE) ×4 IMPLANT
GOWN STRL REUS W/ TWL LRG LVL3 (GOWN DISPOSABLE) IMPLANT
GOWN STRL REUS W/ TWL XL LVL3 (GOWN DISPOSABLE) ×6 IMPLANT
GOWN STRL REUS W/TWL LRG LVL3 (GOWN DISPOSABLE)
GOWN STRL REUS W/TWL XL LVL3 (GOWN DISPOSABLE) ×2
GRAFT DERMACARRIERS 1 TO 1.5 (DISPOSABLE) IMPLANT
NEEDLE HYPO 25X1 1.5 SAFETY (NEEDLE) ×4 IMPLANT
NEEDLE SPNL 18GX3.5 QUINCKE PK (NEEDLE) IMPLANT
NS IRRIG 1000ML POUR BTL (IV SOLUTION) ×4 IMPLANT
PACK BASIN DAY SURGERY FS (CUSTOM PROCEDURE TRAY) ×4 IMPLANT
PEN SKIN MARKING BROAD TIP (MISCELLANEOUS) ×4 IMPLANT
PENCIL BUTTON HOLSTER BLD 10FT (ELECTRODE) IMPLANT
SHEET MEDIUM DRAPE 40X70 STRL (DRAPES) ×4 IMPLANT
SHEETING SILICONE GEL EPI DERM (MISCELLANEOUS) IMPLANT
SLEEVE SCD COMPRESS KNEE MED (MISCELLANEOUS) ×4 IMPLANT
SPONGE GAUZE 4X4 12PLY STER LF (GAUZE/BANDAGES/DRESSINGS) IMPLANT
SPONGE LAP 18X18 X RAY DECT (DISPOSABLE) ×4 IMPLANT
STAPLER VISISTAT 35W (STAPLE) IMPLANT
STOCKINETTE 4X48 STRL (DRAPES) ×4 IMPLANT
STOCKINETTE 6  STRL (DRAPES)
STOCKINETTE 6 STRL (DRAPES) IMPLANT
STOCKINETTE IMPERVIOUS LG (DRAPES) IMPLANT
STRIP CLOSURE SKIN 1/2X4 (GAUZE/BANDAGES/DRESSINGS) ×4 IMPLANT
SUCTION FRAZIER TIP 10 FR DISP (SUCTIONS) IMPLANT
SUT ETHILON 3 0 PS 1 (SUTURE) IMPLANT
SUT MNCRL AB 3-0 PS2 18 (SUTURE) ×4 IMPLANT
SUT MON AB 2-0 CT1 36 (SUTURE) IMPLANT
SUT PROLENE 4 0 P 3 18 (SUTURE) IMPLANT
SUT PROLENE 4 0 PS 2 18 (SUTURE) ×8 IMPLANT
SUT VICRYL 6 0 UNDY PS 6 (SUTURE) IMPLANT
SYR 20CC LL (SYRINGE) IMPLANT
SYR 50ML LL SCALE MARK (SYRINGE) ×8 IMPLANT
SYR BULB 3OZ (MISCELLANEOUS) IMPLANT
SYR CONTROL 10ML LL (SYRINGE) ×4 IMPLANT
TAPE HYPAFIX 6X30 (GAUZE/BANDAGES/DRESSINGS) IMPLANT
TOWEL OR 17X24 6PK STRL BLUE (TOWEL DISPOSABLE) ×8 IMPLANT
TRAY DSU PREP LF (CUSTOM PROCEDURE TRAY) ×4 IMPLANT
TUBE CONNECTING 20X1/4 (TUBING) IMPLANT
UNDERPAD 30X30 (UNDERPADS AND DIAPERS) ×4 IMPLANT
VAC PENCILS W/TUBING CLEAR (MISCELLANEOUS) IMPLANT
YANKAUER SUCT BULB TIP NO VENT (SUCTIONS) ×4 IMPLANT

## 2015-07-04 NOTE — Anesthesia Preprocedure Evaluation (Signed)
Anesthesia Evaluation  Patient identified by MRN, date of birth, ID band Patient awake    Reviewed: Allergy & Precautions, NPO status , Patient's Chart, lab work & pertinent test results  Airway Mallampati: II  TM Distance: >3 FB Neck ROM: Full    Dental  (+) Teeth Intact, Dental Advisory Given   Pulmonary    breath sounds clear to auscultation       Cardiovascular hypertension,  Rhythm:Regular Rate:Normal     Neuro/Psych    GI/Hepatic   Endo/Other    Renal/GU      Musculoskeletal   Abdominal   Peds  Hematology   Anesthesia Other Findings   Reproductive/Obstetrics                            Anesthesia Physical Anesthesia Plan  ASA: II  Anesthesia Plan: General   Post-op Pain Management:    Induction: Intravenous  Airway Management Planned: LMA  Additional Equipment:   Intra-op Plan:   Post-operative Plan:   Informed Consent: I have reviewed the patients History and Physical, chart, labs and discussed the procedure including the risks, benefits and alternatives for the proposed anesthesia with the patient or authorized representative who has indicated his/her understanding and acceptance.   Dental advisory given  Plan Discussed with: CRNA and Anesthesiologist  Anesthesia Plan Comments:         Anesthesia Quick Evaluation  

## 2015-07-04 NOTE — Transfer of Care (Signed)
Immediate Anesthesia Transfer of Care Note  Patient: Laura Hahn  Procedure(s) Performed: Procedure(s) with comments: EXCISION MASS LEFT SMALL FINGER  (Left) POSSIBLE SKIN GRAFT SPLIT THICKNESS (Left) - groin area  Patient Location: PACU  Anesthesia Type:General  Level of Consciousness: sedated  Airway & Oxygen Therapy: Patient Spontanous Breathing and Patient connected to face mask oxygen  Post-op Assessment: Report given to RN and Post -op Vital signs reviewed and stable  Post vital signs: Reviewed and stable  Last Vitals:  Filed Vitals:   07/04/15 1332  BP:   Pulse: 88  Temp:   Resp: 28    Complications: No apparent anesthesia complications

## 2015-07-04 NOTE — Progress Notes (Addendum)
Notified Dr. Towanda Malkin of pt's fifth fingers still dusky after applying warm blankets, ordered to remove dressing and reapply near tip of finger. Tell pt to call him if the duskiness continues. Wrapped hand in warm blanket. Kandice Hams RN removed dressing from left fifth finger, pt felt immediate relief, and finger pinker  applied gauze to finger and rewrapped finger with coban. Left fifth finger - pinker with good cap refill.

## 2015-07-04 NOTE — Brief Op Note (Signed)
07/04/2015  1:27 PM  PATIENT:  Laura Hahn  64 y.o. female  PRE-OPERATIVE DIAGNOSIS:  MASS LEFT SMALL FINGER   POST-OPERATIVE DIAGNOSIS:  MASS LEFT SMALL FINGER  PROCEDURE:  Procedure(s) with comments: EXCISION MASS LEFT SMALL FINGER  (Left) POSSIBLE SKIN GRAFT SPLIT THICKNESS (Left) - groin area  SURGEON:  Surgeon(s) and Role:    * Cristine Polio, MD - Primary  PHYSICIAN ASSISTANT:   ASSISTANTS: none   ANESTHESIA:   general  EBL:  Total I/O In: 1000 [I.V.:1000] Out: -   BLOOD ADMINISTERED:none  DRAINS: none   LOCAL MEDICATIONS USED:  XYLOCAINE   SPECIMEN:  Excision  DISPOSITION OF SPECIMEN:  PATHOLOGY  COUNTS:  YES  TOURNIQUET:  * Missing tourniquet times found for documented tourniquets in log:  [id number] *  DICTATION: .Other Dictation: Dictation Number [id number]  PLAN OF CARE: Discharge to home after PACU  PATIENT DISPOSITION:  PACU - hemodynamically stable.   Delay start of Pharmacological VTE agent (>24hrs) due to surgical blood loss or risk of bleeding: yes

## 2015-07-04 NOTE — Anesthesia Procedure Notes (Signed)
Procedure Name: LMA Insertion Date/Time: 07/04/2015 12:40 PM Performed by: Maryella Shivers Pre-anesthesia Checklist: Patient identified, Emergency Drugs available, Suction available and Patient being monitored Patient Re-evaluated:Patient Re-evaluated prior to inductionOxygen Delivery Method: Circle System Utilized Preoxygenation: Pre-oxygenation with 100% oxygen Intubation Type: IV induction Ventilation: Mask ventilation without difficulty LMA: LMA inserted LMA Size: 4.0 Number of attempts: 1 Airway Equipment and Method: Bite block Placement Confirmation: positive ETCO2 Tube secured with: Tape Dental Injury: Teeth and Oropharynx as per pre-operative assessment

## 2015-07-04 NOTE — H&P (Signed)
Laura Hahn is an 64 y.o. female.   Chief Complaint:Enlarging mass left small finger HPI: Double headed mass left small finger at the PIP and Distal joint areas with growth and reduction of flexion of the finger at the above joints  Past Medical History  Diagnosis Date  . Diverticulosis   . Arthritis     neck  . Osteoarthritis   . Hypertension     states under control with meds., has been on med. x 30 yr.  . Mass of finger of left hand 06/2015    small finger    Past Surgical History  Procedure Laterality Date  . Tonsillectomy    . Abdominal hysterectomy      partial   . Appendectomy    . 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 Mastopexy  12/2014  . Knee arthroscopy Right 05/28/2008  . Wedge resection      ovary  . Finger ganglion cyst excision Right   . Cyst excision Right     great toe    History reviewed. No pertinent family history. Social History:  reports that she has never smoked. She has never used smokeless tobacco. She reports that she drinks alcohol. She reports that she does not use illicit drugs.  Allergies: No Known Allergies  Medications Prior to Admission  Medication Sig Dispense Refill  . aspirin 325 MG tablet Take 325 mg by mouth daily.    Marland Kitchen BIOTIN PO Take by mouth.    . docusate sodium (COLACE) 100 MG capsule Take 100 mg by mouth daily.    Marland Kitchen estrogen-methylTESTOSTERone (ESTRATEST) 1.25-2.5 MG per tablet Take 1 tablet by mouth every morning.    . folic acid (FOLVITE) 1 MG tablet Take 1 mg by mouth daily.    . Lactobacillus (ACIDOPHILUS PO) Take by mouth.    . losartan (COZAAR) 100 MG tablet Take 100 mg by mouth daily.    . Multiple Vitamin (MULTIVITAMIN) tablet Take 1 tablet by mouth daily.    Marland Kitchen triamterene-hydrochlorothiazide (MAXZIDE-25) 37.5-25 MG tablet Take 1 tablet by mouth daily.      No results  found for this or any previous visit (from the past 48 hour(s)). No results found.  Review of Systems  Constitutional: Negative.   HENT: Negative.   Eyes: Negative.   Respiratory: Negative.   Cardiovascular: Negative.   Gastrointestinal: Negative.   Genitourinary: Negative.   Musculoskeletal: Negative.   Skin: Negative.   Neurological: Negative.   Endo/Heme/Allergies: Negative.   Psychiatric/Behavioral: Negative.     Blood pressure 124/82, pulse 73, temperature 98.4 F (36.9 C), temperature source Oral, resp. rate 20, height 5\' 8"  (1.727 m), weight 77.565 kg (171 lb), SpO2 98 %. Physical Exam   Assessment/Plan Enlarging mass left small finger at the extensor surface for excision and possible full thickness skin graft  Ryatt Corsino L 07/04/2015, 11:58 AM

## 2015-07-04 NOTE — Discharge Instructions (Signed)
Activity (include date of return to work if known) As tolerated: NO showers NO driving No heavy activities  Diet:regular No restrictions:  Wound Care: Keep dressing clean & dry  Do not change dressings  Special Instructions:    Call Doctor if any unusual problems occur such as pain, excessive Bleeding, unrelieved Nausea/vomiting, Fever &/or chills When lying down, keep head elevated on 2-3 pillows or back-rest  Follow-up appointment: Please call the office.  The patient received discharge instruction from:___________________________________________   Patient signature ________________________________________ / Date___________    Signature of individual providing instructions/ Date________________                 Post Anesthesia Home Care Instructions  Activity: Get plenty of rest for the remainder of the day. A responsible adult should stay with you for 24 hours following the procedure.  For the next 24 hours, DO NOT: -Drive a car -Paediatric nurse -Drink alcoholic beverages -Take any medication unless instructed by your physician -Make any legal decisions or sign important papers.  Meals: Start with liquid foods such as gelatin or soup. Progress to regular foods as tolerated. Avoid greasy, spicy, heavy foods. If nausea and/or vomiting occur, drink only clear liquids until the nausea and/or vomiting subsides. Call your physician if vomiting continues.  Special Instructions/Symptoms: Your throat may feel dry or sore from the anesthesia or the breathing tube placed in your throat during surgery. If this causes discomfort, gargle with warm salt water. The discomfort should disappear within 24 hours.  If you had a scopolamine patch placed behind your ear for the management of post- operative nausea and/or vomiting:  1. The medication in the patch is effective for 72 hours, after which it should be removed.  Wrap patch in a tissue and discard in the trash. Wash hands  thoroughly with soap and water. 2. You may remove the patch earlier than 72 hours if you experience unpleasant side effects which may include dry mouth, dizziness or visual disturbances. 3. Avoid touching the patch. Wash your hands with soap and water after contact with the patch.     Post Anesthesia Home Care Instructions  Activity: Get plenty of rest for the remainder of the day. A responsible adult should stay with you for 24 hours following the procedure.  For the next 24 hours, DO NOT: -Drive a car -Paediatric nurse -Drink alcoholic beverages -Take any medication unless instructed by your physician -Make any legal decisions or sign important papers.  Meals: Start with liquid foods such as gelatin or soup. Progress to regular foods as tolerated. Avoid greasy, spicy, heavy foods. If nausea and/or vomiting occur, drink only clear liquids until the nausea and/or vomiting subsides. Call your physician if vomiting continues.  Special Instructions/Symptoms: Your throat may feel dry or sore from the anesthesia or the breathing tube placed in your throat during surgery. If this causes discomfort, gargle with warm salt water. The discomfort should disappear within 24 hours.  If you had a scopolamine patch placed behind your ear for the management of post- operative nausea and/or vomiting:  1. The medication in the patch is effective for 72 hours, after which it should be removed.  Wrap patch in a tissue and discard in the trash. Wash hands thoroughly with soap and water. 2. You may remove the patch earlier than 72 hours if you experience unpleasant side effects which may include dry mouth, dizziness or visual disturbances. 3. Avoid touching the patch. Wash your hands with soap and water after  patch. °  ° ° °

## 2015-07-04 NOTE — Anesthesia Postprocedure Evaluation (Signed)
  Anesthesia Post-op Note  Patient: Laura Hahn  Procedure(s) Performed: Procedure(s) with comments: EXCISION MASS LEFT SMALL FINGER  (Left) POSSIBLE SKIN GRAFT SPLIT THICKNESS (Left) - groin area  Patient Location: PACU  Anesthesia Type:General  Level of Consciousness: awake and alert   Airway and Oxygen Therapy: Patient Spontanous Breathing  Post-op Pain: none  Post-op Assessment: Post-op Vital signs reviewed              Post-op Vital Signs: Reviewed  Last Vitals:  Filed Vitals:   07/04/15 1430  BP: 127/81  Pulse: 74  Temp:   Resp: 13    Complications: No apparent anesthesia complications

## 2015-07-05 ENCOUNTER — Encounter (HOSPITAL_BASED_OUTPATIENT_CLINIC_OR_DEPARTMENT_OTHER): Payer: Self-pay | Admitting: Specialist

## 2015-07-05 NOTE — Op Note (Signed)
Laura Hahn, Laura Hahn NO.:  [id number]  MEDICAL RECORD NO.:  [id number]  LOCATION:                               FACILITY:  [facility]  PHYSICIAN:  Berneta Sages L. Alexcia Schools, M.D.DATE OF BIRTH:  Jan 24, 1951  DATE OF PROCEDURE:  07/04/2015 DATE OF DISCHARGE:  07/04/2015                              OPERATIVE REPORT   A 64 year old lady who has a larger mass involving her left small finger, double-headed lesion consistent with ganglion cyst or other cystic lesion of the finger.  X-rays negative for bone erosion.  PROCEDURES DONE:  Exploration, excision of mass of left small finger consistent with a large ganglion cyst defect closed with a full- thickness skin graft coverage over the defect.  ANESTHESIA:  General with tourniquet.  DESCRIPTION OF PROCEDURE:  The patient underwent general anesthesia, intubated orally.  Tourniquet was placed on the left arm with the tourniquet pressure at 250 mm.  Prep was done with Hibiclens soap and solution, walled off with sterile towels and drapes so as to make a sterile field including left upper extremity and the left groin areas. Esmarch was released and taken down and then dissection was carried with #15 blade over the distal part of the mass.  Dissection was carried over.  Skin was so very, very thin that it was not to be used to close, removed the area in elliptical fashion and dissection was done with small tourniquet scissors over the paratenon of the extensor tendon of the small finger removing the mass in total.  We took the Bovie to dissect and Bovie around the edges of the dissected area.  The defect was then measured and then a full-thickness skin graft was taken from the left groin region, defat placed on the defect and closed with multiple sutures of 5-0 Prolene, every other suture left long to make a stent dressing, which was placed over Xeroform gauze 4 x 4, and a flexible wrap.  The groin was closed with 3-0 Monocryl  subcutaneously x2 level and then a running subcuticular stitch of 3-0 Monocryl.  Steri- Strips and soft dressing applied to all the areas, 4x4s, and Hypafix tape.  She tolerated all procedures very well.  The patient's __________ tourniquet was released slowly without increased bleeding.  At the end of the procedure, capillary refill of the fingers were excellent.  She was then taken to recovery room in excellent condition.     Odella Aquas. Towanda Malkin, M.D.     Elie Confer  D:  07/04/2015  T:  07/04/2015  Job:  [id number]

## 2015-07-15 ENCOUNTER — Encounter (HOSPITAL_BASED_OUTPATIENT_CLINIC_OR_DEPARTMENT_OTHER): Payer: Self-pay | Admitting: Specialist

## 2015-08-22 HISTORY — PX: COLONOSCOPY: SHX174

## 2015-12-13 DIAGNOSIS — M1990 Unspecified osteoarthritis, unspecified site: Secondary | ICD-10-CM | POA: Insufficient documentation

## 2015-12-13 DIAGNOSIS — I10 Essential (primary) hypertension: Secondary | ICD-10-CM | POA: Insufficient documentation

## 2016-12-25 DIAGNOSIS — K5792 Diverticulitis of intestine, part unspecified, without perforation or abscess without bleeding: Secondary | ICD-10-CM | POA: Insufficient documentation

## 2016-12-25 DIAGNOSIS — Z90710 Acquired absence of both cervix and uterus: Secondary | ICD-10-CM | POA: Insufficient documentation

## 2017-03-17 HISTORY — PX: BOWEL RESECTION: SHX1257

## 2017-10-29 ENCOUNTER — Ambulatory Visit: Payer: Self-pay | Admitting: Orthopedic Surgery

## 2017-11-11 NOTE — Patient Instructions (Addendum)
Your procedure is scheduled on: Wednesday, November 20, 2017   Surgery Time:  8:30AM-10:30AM   Report to [facility]  Entrance    Report to admitting at 6:00 AM   Call this number if you have problems the morning of surgery [phone]   Do not eat food or drink liquids :After Midnight.   Do NOT smoke after Midnight   Take these medicines the morning of surgery with A SIP OF WATER:  None                               You may not have any metal on your body including hair pins, jewelry, and body piercings             Do not wear make-up, lotions, powders, perfumes/cologne, or deodorant             Do not wear nail polish.  Do not shave  48 hours prior to surgery.               Do not bring valuables to the hospital. [place].   Contacts, dentures or bridgework may not be worn into surgery.   Leave suitcase in the car. After surgery it may be brought to your room.   Special Instructions: Bring a copy of your healthcare power of attorney and living will documents         the day of surgery if you haven't scanned them in before.              Please read over the following fact sheets you were given:  [facility] - Preparing for Surgery Before surgery, you can play an important role.  Because skin is not sterile, your skin needs to be as free of germs as possible.  You can reduce the number of germs on your skin by washing with CHG (chlorahexidine gluconate) soap before surgery.  CHG is an antiseptic cleaner which kills germs and bonds with the skin to continue killing germs even after washing. Please DO NOT use if you have an allergy to CHG or antibacterial soaps.  If your skin becomes reddened/irritated stop using the CHG and inform your nurse when you arrive at Short Stay. Do not shave (including legs and underarms) for at least 48 hours prior to the first CHG shower.  You may shave your face/neck.  Please follow  these instructions carefully:  1.  Shower with CHG Soap the night before surgery and the  morning of surgery.  2.  If you choose to wash your hair, wash your hair first as usual with your normal  shampoo.  3.  After you shampoo, rinse your hair and body thoroughly to remove the shampoo.                             4.  Use CHG as you would any other liquid soap.  You can apply chg directly to the skin and wash.  Gently with a scrungie or clean washcloth.  5.  Apply the CHG Soap to your body ONLY FROM THE NECK DOWN.   Do   not use on face/ open  Wound or open sores. Avoid contact with eyes, ears mouth and   genitals (private parts).                       Wash face,  Genitals (private parts) with your normal soap.             6.  Wash thoroughly, paying special attention to the area where your    surgery  will be performed.  7.  Thoroughly rinse your body with warm water from the neck down.  8.  DO NOT shower/wash with your normal soap after using and rinsing off the CHG Soap.                9.  Pat yourself dry with a clean towel.            10.  Wear clean pajamas.            11.  Place clean sheets on your bed the night of your first shower and do not  sleep with pets. Day of Surgery : Do not apply any lotions/deodorants the morning of surgery.  Please wear clean clothes to the hospital/surgery center.  FAILURE TO FOLLOW THESE INSTRUCTIONS MAY RESULT IN THE CANCELLATION OF YOUR SURGERY  PATIENT SIGNATURE_________________________________  NURSE SIGNATURE__________________________________  ________________________________________________________________________   Adam Phenix  An incentive spirometer is a tool that can help keep your lungs clear and active. This tool measures how well you are filling your lungs with each breath. Taking long deep breaths may help reverse or decrease the chance of developing breathing (pulmonary) problems (especially  infection) following:  A long period of time when you are unable to move or be active. BEFORE THE PROCEDURE   If the spirometer includes an indicator to show your best effort, your nurse or respiratory therapist will set it to a desired goal.  If possible, sit up straight or lean slightly forward. Try not to slouch.  Hold the incentive spirometer in an upright position. INSTRUCTIONS FOR USE  1. Sit on the edge of your bed if possible, or sit up as far as you can in bed or on a chair. 2. Hold the incentive spirometer in an upright position. 3. Breathe out normally. 4. Place the mouthpiece in your mouth and seal your lips tightly around it. 5. Breathe in slowly and as deeply as possible, raising the piston or the ball toward the top of the column. 6. Hold your breath for 3-5 seconds or for as long as possible. Allow the piston or ball to fall to the bottom of the column. 7. Remove the mouthpiece from your mouth and breathe out normally. 8. Rest for a few seconds and repeat Steps 1 through 7 at least 10 times every 1-2 hours when you are awake. Take your time and take a few normal breaths between deep breaths. 9. The spirometer may include an indicator to show your best effort. Use the indicator as a goal to work toward during each repetition. 10. After each set of 10 deep breaths, practice coughing to be sure your lungs are clear. If you have an incision (the cut made at the time of surgery), support your incision when coughing by placing a pillow or rolled up towels firmly against it. Once you are able to get out of bed, walk around indoors and cough well. You may stop using the incentive spirometer when instructed by your caregiver.  RISKS AND COMPLICATIONS  Take your time  so you do not get dizzy or light-headed.  If you are in pain, you may need to take or ask for pain medication before doing incentive spirometry. It is harder to take a deep breath if you are having pain. AFTER  USE  Rest and breathe slowly and easily.  It can be helpful to keep track of a log of your progress. Your caregiver can provide you with a simple table to help with this. If you are using the spirometer at home, follow these instructions: [place] IF:   You are having difficultly using the spirometer.  You have trouble using the spirometer as often as instructed.  Your pain medication is not giving enough relief while using the spirometer.  You develop fever of 100.5 F (38.1 C) or higher. SEEK IMMEDIATE MEDICAL CARE IF:   You cough up bloody sputum that had not been present before.  You develop fever of 102 F (38.9 C) or greater.  You develop worsening pain at or near the incision site. MAKE SURE YOU:   Understand these instructions.  Will watch your condition.  Will get help right away if you are not doing well or get worse. Document Released: 01/14/2007 Document Revised: 11/26/2011 Document Reviewed: 03/17/2007 [facility] Patient Information 2014 [place], [place].   ________________________________________________________________________

## 2017-11-12 ENCOUNTER — Encounter (HOSPITAL_COMMUNITY)
Admission: RE | Admit: 2017-11-12 | Discharge: 2017-11-12 | Disposition: A | Discharge status: Home / Self Care | Payer: BLUE CROSS/BLUE SHIELD | Source: Ambulatory Visit | Attending: Specialist | Admitting: Specialist

## 2017-11-12 ENCOUNTER — Encounter (HOSPITAL_COMMUNITY): Payer: Self-pay

## 2017-11-12 ENCOUNTER — Other Ambulatory Visit: Payer: Self-pay

## 2017-11-12 DIAGNOSIS — M1712 Unilateral primary osteoarthritis, left knee: Secondary | ICD-10-CM | POA: Insufficient documentation

## 2017-11-12 DIAGNOSIS — Z01818 Encounter for other preprocedural examination: Secondary | ICD-10-CM | POA: Insufficient documentation

## 2017-11-12 DIAGNOSIS — I444 Left anterior fascicular block: Secondary | ICD-10-CM | POA: Diagnosis not present

## 2017-11-12 DIAGNOSIS — Z01812 Encounter for preprocedural laboratory examination: Secondary | ICD-10-CM | POA: Diagnosis not present

## 2017-11-12 HISTORY — DX: Personal history of urinary (tract) infections: Z87.440

## 2017-11-12 HISTORY — DX: Other specified postprocedural states: R11.2

## 2017-11-12 HISTORY — DX: Other specified postprocedural states: Z98.890

## 2017-11-12 LAB — BASIC METABOLIC PANEL
ANION GAP: 12 (ref 5–15)
BUN: 23 mg/dL — ABNORMAL HIGH (ref 6–20)
CALCIUM: 9.9 mg/dL (ref 8.9–10.3)
CO2: 25 mmol/L (ref 22–32)
Chloride: 100 mmol/L — ABNORMAL LOW (ref 101–111)
Creatinine, Ser: 0.95 mg/dL (ref 0.44–1.00)
GFR calc non Af Amer: >60 mL/min (ref >60)
GLUCOSE: 117 mg/dL — AB (ref 65–99)
Potassium: 4.1 mmol/L (ref 3.5–5.1)
SODIUM: 137 mmol/L (ref 135–145)

## 2017-11-12 LAB — URINALYSIS, ROUTINE W REFLEX MICROSCOPIC
Bilirubin Urine: NEGATIVE
GLUCOSE, UA: NEGATIVE mg/dL
Hgb urine dipstick: NEGATIVE
KETONES UR: NEGATIVE mg/dL
LEUKOCYTES UA: NEGATIVE
Nitrite: NEGATIVE
Protein, ur: NEGATIVE mg/dL
Specific Gravity, Urine: 1.01 (ref 1.005–1.030)
pH: 7 (ref 5.0–8.0)

## 2017-11-12 LAB — CBC
HCT: 42.8 % (ref 36.0–46.0)
HEMOGLOBIN: 14.5 g/dL (ref 12.0–15.0)
MCH: 33.8 pg (ref 26.0–34.0)
MCHC: 33.9 g/dL (ref 30.0–36.0)
MCV: 99.8 fL (ref 78.0–100.0)
Platelets: 270 10*3/uL (ref 150–400)
RBC: 4.29 MIL/uL (ref 3.87–5.11)
RDW: 12.4 % (ref 11.5–15.5)
WBC: 5.6 10*3/uL (ref 4.0–10.5)

## 2017-11-12 LAB — PROTIME-INR
INR: 0.94
PROTHROMBIN TIME: 12.5 s (ref 11.4–15.2)

## 2017-11-12 LAB — SURGICAL PCR SCREEN
MRSA, PCR: NEGATIVE
Staphylococcus aureus: NEGATIVE

## 2017-11-12 LAB — APTT: aPTT: 28 seconds (ref 24–36)

## 2017-11-12 NOTE — Pre-Procedure Instructions (Signed)
Last office visit and surgical clearance, EKG from Dr. Charleen Kirks from 10/22/2017,  Received 11/12/17 and place in hard chart.

## 2017-11-13 ENCOUNTER — Ambulatory Visit: Payer: Self-pay | Admitting: Orthopedic Surgery

## 2017-11-13 NOTE — H&P (Signed)
Laura Hahn is an 67 y.o. female.   Chief Complaint: left knee pain HPI: The patient is here for her H & P. The patient is scheduled for a total knee replacement by Dr. Tonita Cong on 11/20/2017 at [facility].  She reports chronic pain left knee progressively worsening and refractory to conservative treatment. It is interfering with activities of daily living and quality-of-life and at this point she desires to proceed with surgery.  Dr. Tonita Cong and the patient mutually agreed to proceed with a left total knee replacement. Risks and benefits of the procedure were discussed including stiffness, suboptimal range of motion, persistent pain, infection requiring removal of prosthesis and reinsertion, need for prophylactic antibiotics in the future, for example, dental procedures, possible need for manipulation, revision in the future and also anesthetic complications including DVT, PE, etc. We discussed the perioperative course, time in the hospital, postoperative recovery and the need for elevation to control swelling. We also discussed the predicted range of motion and the probability that squatting and kneeling would be unobtainable in the future. In addition, postoperative anticoagulation was discussed. We have obtained preoperative medical clearance as necessary. Provided illustrated handout and discussed it in detail. They will enroll in the total joint replacement educational forum at the hospital.  Last time she had a slightly prolonged hospital stay due to constipation, hyponatremia and hypokalemia. We have discussed using IV fluids with potassium, utilizing Movantik postoperatively, could entertain a hospitalist consultation if necessary but would not plan on it. Last time we used Xarelto for DVT prophylaxis, we will plan aspirin this time, no history of DVT. Since her last surgery she has had a bowel resection.  Also of note, her PCP recently started her on metoprolol as her blood pressure had  seem to be creeping up slightly. She is on it for about 2 and half weeks when she started experiencing a total body rash. Her last dose of the medication was 2 days ago, her rash does seem to be improving but there is still somewhat of a red rash over that left knee. Is not itchy or painful.  Past Medical History:  Diagnosis Date  . Arthritis    neck  . Diverticulosis   . History of kidney infection   . Hypertension    states under control with meds., has been on med. x 30 yr.  . Mass of finger of left hand 06/2015   small finger  . Osteoarthritis   . PONV (postoperative nausea and vomiting)     Past Surgical History:  Procedure Laterality Date  . ABDOMINAL HYSTERECTOMY     partial   . APPENDECTOMY    . BOWEL RESECTION  03/2017   Dr. Amalia Hailey [facility]  . breast lift    . COLONOSCOPY    . CYST EXCISION Right    great toe  . FINGER GANGLION CYST EXCISION Right   . KNEE ARTHROSCOPY Right 05/28/2008  . LIPOSUCTION    . MASS EXCISION Left 07/04/2015   Procedure: EXCISION MASS LEFT SMALL FINGER ;  Surgeon: Cristine Polio, MD;  Location: [facility];  Service: Plastics;  Laterality: Left;  Marland Kitchen MASTOPEXY  12/2014  . SKIN FULL THICKNESS GRAFT Left 07/04/2015   Procedure: SKIN GRAFT FULL THICKNESS;  Surgeon: Cristine Polio, MD;  Location: [place];  Service: Plastics;  Laterality: Left;  . TONSILLECTOMY    . TOTAL KNEE ARTHROPLASTY Right 05/27/2014   Procedure: TOTAL RIGHT KNEE ARTHROPLASTY;  Surgeon: Doroteo Bradford  Tonita Cong, MD;  Location: [facility];  Service: Orthopedics;  Laterality: Right;  . WEDGE RESECTION     ovary  . WISDOM TOOTH EXTRACTION      No family history on file. Social History:  reports that  has never smoked. she has never used smokeless tobacco. She reports that she drinks alcohol. She reports that she does not use drugs.  Allergies:  Allergies  Allergen Reactions  . Lisinopril Cough  . Metoprolol Rash     (Not in a hospital  admission)  Results for orders placed or performed during the hospital encounter of 11/12/17 (from the past 48 hour(s))  APTT     Status: None   Collection Time: 11/12/17  9:58 AM  Result Value Ref Range   aPTT 28 24 - 36 seconds    Comment: Performed at [facility], [place] [address] [place], [place] [address]  Basic metabolic panel     Status: Abnormal   Collection Time: 11/12/17  9:58 AM  Result Value Ref Range   Sodium 137 135 - 145 mmol/L   Potassium 4.1 3.5 - 5.1 mmol/L   Chloride 100 (L) 101 - 111 mmol/L   CO2 25 22 - 32 mmol/L   Glucose, Bld 117 (H) 65 - 99 mg/dL   BUN 23 (H) 6 - 20 mg/dL   Creatinine, Ser 0.95 0.44 - 1.00 mg/dL   Calcium 9.9 8.9 - 10.3 mg/dL   GFR calc non Af Amer >60 >60 mL/min   GFR calc Af Amer >60 >60 mL/min    Comment: (NOTE) The eGFR has been calculated using the CKD EPI equation. This calculation has not been validated in all clinical situations. eGFR's persistently <60 mL/min signify possible Chronic Kidney Disease.    Anion gap 12 5 - 15    Comment: Performed at [facility], [place] [address], [place], [place] [address]  CBC     Status: None   Collection Time: 11/12/17  9:58 AM  Result Value Ref Range   WBC 5.6 4.0 - 10.5 K/uL   RBC 4.29 3.87 - 5.11 MIL/uL   Hemoglobin 14.5 12.0 - 15.0 g/dL   HCT 42.8 36.0 - 46.0 %   MCV 99.8 78.0 - 100.0 fL   MCH 33.8 26.0 - 34.0 pg   MCHC 33.9 30.0 - 36.0 g/dL   RDW 12.4 11.5 - 15.5 %   Platelets 270 150 - 400 K/uL    Comment: Performed at [facility], [place] [address], [place], [place] [address]  Protime-INR     Status: None   Collection Time: 11/12/17  9:58 AM  Result Value Ref Range   Prothrombin Time 12.5 11.4 - 15.2 seconds   INR 0.94     Comment: Performed at [facility], [place] [address] [place],  [address]  Urinalysis, Routine w reflex microscopic     Status: None   Collection Time: 11/12/17  9:58 AM  Result  Value Ref Range   Color, Urine YELLOW YELLOW   APPearance CLEAR CLEAR   Specific Gravity, Urine 1.010 1.005 - 1.030   pH 7.0 5.0 - 8.0   Glucose, UA NEGATIVE NEGATIVE mg/dL   Hgb urine dipstick NEGATIVE NEGATIVE   Bilirubin Urine NEGATIVE NEGATIVE   Ketones, ur NEGATIVE NEGATIVE mg/dL   Protein, ur NEGATIVE NEGATIVE mg/dL   Nitrite NEGATIVE NEGATIVE   Leukocytes, UA NEGATIVE NEGATIVE    Comment: Microscopic not done on urines with negative protein, blood, leukocytes, nitrite,  or glucose < 500 mg/dL. Performed at [facility], [place] [address], [place], [place] [address]   Surgical pcr screen     Status: None   Collection Time: 11/12/17  9:58 AM  Result Value Ref Range   MRSA, PCR NEGATIVE NEGATIVE   Staphylococcus aureus NEGATIVE NEGATIVE    Comment: (NOTE) The Xpert SA Assay (FDA approved for NASAL specimens in patients 44 years of age and older), is one component of a comprehensive surveillance program. It is not intended to diagnose infection nor to guide or monitor treatment. Performed at [facility], [place] [address] [place], [place] [address]    No results found.  Review of Systems  Constitutional: Negative.   HENT: Negative.   Eyes: Negative.   Respiratory: Negative.   Cardiovascular: Negative.   Gastrointestinal: Negative.   Genitourinary: Negative.   Musculoskeletal: Positive for joint pain.  Skin: Positive for rash. Negative for itching.  Neurological: Negative.   Endo/Heme/Allergies: Negative.   Psychiatric/Behavioral: Negative.     There were no vitals taken for this visit. Physical Exam  Constitutional: She is oriented to person, place, and time. She appears well-developed.  HENT:  Head: Normocephalic.  Eyes: Pupils are equal, round, and reactive to light.  Neck: Normal range of motion.  Cardiovascular: Normal rate.  Respiratory: Effort normal.  GI: Soft.  Musculoskeletal:  Patient is a 67 year old  female.  Awake, alert, oriented 3. Well-nourished and well-developed. Antalgic gait. No assistive devices.  Left knee tender medial joint line patellofemoral pain compression no DVT ranges -3-120 . No instability.  Neurological: She is alert and oriented to person, place, and time.  Skin: Rash noted.    Prior x-rays reviewed with bone-on-bone end-stage arthritis medial compartment left knee.  Assessment/Plan Pt with end-stage Left knee DJD, bone-on-bone, refractory to conservative tx, scheduled for Left total knee replacement by Dr. Tonita Cong on March 6. We again discussed the procedure itself as well as risks, complications and alternatives, including but not limited to DVT, PE, infx, bleeding, failure of procedure, need for secondary procedure including manipulation, nerve injury, ongoing pain/symptoms, anesthesia risk, even stroke or death. Also discussed typical post-op protocols, activity restrictions, need for PT, flexion/extension exercises, time out of work. Discussed need for DVT ppx post-op per protocol. Discussed dental ppx and infx prevention. Also discussed limitations post-operatively such as kneeling and squatting. All questions were answered. Patient desires to proceed with surgery as scheduled.  Will hold supplements, ASA and NSAIDs accordingly. Will remain NPO after midnight the night before surgery. She has Artie had her preoperative testing and I have received the results of that. Anticipate hospital stay to include at least 2 midnights given medical history and to ensure proper pain control. Plan ASA 356m BID for DVT ppx post-op. Plan Percocet for pain, which I have prescribed her now to take as needed leading into surgery, as well as Robaxin, Colace, Miralax. Plan home with HHPT post-op with family members at home for assistance. Will follow up 10-14 days post-op for suture removal and xrays. She will call with any questions or concerns in the interim. Again as noted in HPI, plan  to utilize Movantik, IV fluids with potassium.  If her rash is not improving within the next few days she is to let me know as we may need to postpone surgery if that does not clear up prior.  Plan left total knee replacement  BCecilie Kicks, PA-C for Dr. BTonita Cong2/27/2019, 3:47 PM

## 2017-11-13 NOTE — H&P (View-Only) (Signed)
Laura Hahn is an 67 y.o. female.   Chief Complaint: left knee pain HPI: The patient is here for her H & P. The patient is scheduled for a total knee replacement by Dr. Tonita Cong on 11/20/2017 at [facility].  She reports chronic pain left knee progressively worsening and refractory to conservative treatment. It is interfering with activities of daily living and quality-of-life and at this point she desires to proceed with surgery.  Dr. Tonita Cong and the patient mutually agreed to proceed with a left total knee replacement. Risks and benefits of the procedure were discussed including stiffness, suboptimal range of motion, persistent pain, infection requiring removal of prosthesis and reinsertion, need for prophylactic antibiotics in the future, for example, dental procedures, possible need for manipulation, revision in the future and also anesthetic complications including DVT, PE, etc. We discussed the perioperative course, time in the hospital, postoperative recovery and the need for elevation to control swelling. We also discussed the predicted range of motion and the probability that squatting and kneeling would be unobtainable in the future. In addition, postoperative anticoagulation was discussed. We have obtained preoperative medical clearance as necessary. Provided illustrated handout and discussed it in detail. They will enroll in the total joint replacement educational forum at the hospital.  Last time she had a slightly prolonged hospital stay due to constipation, hyponatremia and hypokalemia. We have discussed using IV fluids with potassium, utilizing Movantik postoperatively, could entertain a hospitalist consultation if necessary but would not plan on it. Last time we used Xarelto for DVT prophylaxis, we will plan aspirin this time, no history of DVT. Since her last surgery she has had a bowel resection.  Also of note, her PCP recently started her on metoprolol as her blood pressure had  seem to be creeping up slightly. She is on it for about 2 and half weeks when she started experiencing a total body rash. Her last dose of the medication was 2 days ago, her rash does seem to be improving but there is still somewhat of a red rash over that left knee. Is not itchy or painful.  Past Medical History:  Diagnosis Date  . Arthritis    neck  . Diverticulosis   . History of kidney infection   . Hypertension    states under control with meds., has been on med. x 30 yr.  . Mass of finger of left hand 06/2015   small finger  . Osteoarthritis   . PONV (postoperative nausea and vomiting)     Past Surgical History:  Procedure Laterality Date  . ABDOMINAL HYSTERECTOMY     partial   . APPENDECTOMY    . BOWEL RESECTION  03/2017   Dr. Amalia Hailey [facility]  . breast lift    . COLONOSCOPY    . CYST EXCISION Right    great toe  . FINGER GANGLION CYST EXCISION Right   . KNEE ARTHROSCOPY Right 05/28/2008  . LIPOSUCTION    . MASS EXCISION Left 07/04/2015   Procedure: EXCISION MASS LEFT SMALL FINGER ;  Surgeon: Cristine Polio, MD;  Location: [facility];  Service: Plastics;  Laterality: Left;  Marland Kitchen MASTOPEXY  12/2014  . SKIN FULL THICKNESS GRAFT Left 07/04/2015   Procedure: SKIN GRAFT FULL THICKNESS;  Surgeon: Cristine Polio, MD;  Location: [place];  Service: Plastics;  Laterality: Left;  . TONSILLECTOMY    . TOTAL KNEE ARTHROPLASTY Right 05/27/2014   Procedure: TOTAL RIGHT KNEE ARTHROPLASTY;  Surgeon: Doroteo Bradford  Tonita Cong, MD;  Location: [facility];  Service: Orthopedics;  Laterality: Right;  . WEDGE RESECTION     ovary  . WISDOM TOOTH EXTRACTION      No family history on file. Social History:  reports that  has never smoked. she has never used smokeless tobacco. She reports that she drinks alcohol. She reports that she does not use drugs.  Allergies:  Allergies  Allergen Reactions  . Lisinopril Cough  . Metoprolol Rash     (Not in a hospital  admission)  Results for orders placed or performed during the hospital encounter of 11/12/17 (from the past 48 hour(s))  APTT     Status: None   Collection Time: 11/12/17  9:58 AM  Result Value Ref Range   aPTT 28 24 - 36 seconds    Comment: Performed at [facility], [place] [address] [place], [place] [address]  Basic metabolic panel     Status: Abnormal   Collection Time: 11/12/17  9:58 AM  Result Value Ref Range   Sodium 137 135 - 145 mmol/L   Potassium 4.1 3.5 - 5.1 mmol/L   Chloride 100 (L) 101 - 111 mmol/L   CO2 25 22 - 32 mmol/L   Glucose, Bld 117 (H) 65 - 99 mg/dL   BUN 23 (H) 6 - 20 mg/dL   Creatinine, Ser 0.95 0.44 - 1.00 mg/dL   Calcium 9.9 8.9 - 10.3 mg/dL   GFR calc non Af Amer >60 >60 mL/min   GFR calc Af Amer >60 >60 mL/min    Comment: (NOTE) The eGFR has been calculated using the CKD EPI equation. This calculation has not been validated in all clinical situations. eGFR's persistently <60 mL/min signify possible Chronic Kidney Disease.    Anion gap 12 5 - 15    Comment: Performed at [facility], [place] [address], [place], [place] [address]  CBC     Status: None   Collection Time: 11/12/17  9:58 AM  Result Value Ref Range   WBC 5.6 4.0 - 10.5 K/uL   RBC 4.29 3.87 - 5.11 MIL/uL   Hemoglobin 14.5 12.0 - 15.0 g/dL   HCT 42.8 36.0 - 46.0 %   MCV 99.8 78.0 - 100.0 fL   MCH 33.8 26.0 - 34.0 pg   MCHC 33.9 30.0 - 36.0 g/dL   RDW 12.4 11.5 - 15.5 %   Platelets 270 150 - 400 K/uL    Comment: Performed at [facility], [place] [address], [place], [place] [address]  Protime-INR     Status: None   Collection Time: 11/12/17  9:58 AM  Result Value Ref Range   Prothrombin Time 12.5 11.4 - 15.2 seconds   INR 0.94     Comment: Performed at [facility], [place] [address] [place],  [address]  Urinalysis, Routine w reflex microscopic     Status: None   Collection Time: 11/12/17  9:58 AM  Result  Value Ref Range   Color, Urine YELLOW YELLOW   APPearance CLEAR CLEAR   Specific Gravity, Urine 1.010 1.005 - 1.030   pH 7.0 5.0 - 8.0   Glucose, UA NEGATIVE NEGATIVE mg/dL   Hgb urine dipstick NEGATIVE NEGATIVE   Bilirubin Urine NEGATIVE NEGATIVE   Ketones, ur NEGATIVE NEGATIVE mg/dL   Protein, ur NEGATIVE NEGATIVE mg/dL   Nitrite NEGATIVE NEGATIVE   Leukocytes, UA NEGATIVE NEGATIVE    Comment: Microscopic not done on urines with negative protein, blood, leukocytes, nitrite,  or glucose < 500 mg/dL. Performed at [facility], [place] [address], [place], [place] [address]   Surgical pcr screen     Status: None   Collection Time: 11/12/17  9:58 AM  Result Value Ref Range   MRSA, PCR NEGATIVE NEGATIVE   Staphylococcus aureus NEGATIVE NEGATIVE    Comment: (NOTE) The Xpert SA Assay (FDA approved for NASAL specimens in patients 44 years of age and older), is one component of a comprehensive surveillance program. It is not intended to diagnose infection nor to guide or monitor treatment. Performed at [facility], [place] [address] [place], [place] [address]    No results found.  Review of Systems  Constitutional: Negative.   HENT: Negative.   Eyes: Negative.   Respiratory: Negative.   Cardiovascular: Negative.   Gastrointestinal: Negative.   Genitourinary: Negative.   Musculoskeletal: Positive for joint pain.  Skin: Positive for rash. Negative for itching.  Neurological: Negative.   Endo/Heme/Allergies: Negative.   Psychiatric/Behavioral: Negative.     There were no vitals taken for this visit. Physical Exam  Constitutional: She is oriented to person, place, and time. She appears well-developed.  HENT:  Head: Normocephalic.  Eyes: Pupils are equal, round, and reactive to light.  Neck: Normal range of motion.  Cardiovascular: Normal rate.  Respiratory: Effort normal.  GI: Soft.  Musculoskeletal:  Patient is a 67 year old  female.  Awake, alert, oriented 3. Well-nourished and well-developed. Antalgic gait. No assistive devices.  Left knee tender medial joint line patellofemoral pain compression no DVT ranges -3-120 . No instability.  Neurological: She is alert and oriented to person, place, and time.  Skin: Rash noted.    Prior x-rays reviewed with bone-on-bone end-stage arthritis medial compartment left knee.  Assessment/Plan Pt with end-stage Left knee DJD, bone-on-bone, refractory to conservative tx, scheduled for Left total knee replacement by Dr. Tonita Cong on March 6. We again discussed the procedure itself as well as risks, complications and alternatives, including but not limited to DVT, PE, infx, bleeding, failure of procedure, need for secondary procedure including manipulation, nerve injury, ongoing pain/symptoms, anesthesia risk, even stroke or death. Also discussed typical post-op protocols, activity restrictions, need for PT, flexion/extension exercises, time out of work. Discussed need for DVT ppx post-op per protocol. Discussed dental ppx and infx prevention. Also discussed limitations post-operatively such as kneeling and squatting. All questions were answered. Patient desires to proceed with surgery as scheduled.  Will hold supplements, ASA and NSAIDs accordingly. Will remain NPO after midnight the night before surgery. She has Artie had her preoperative testing and I have received the results of that. Anticipate hospital stay to include at least 2 midnights given medical history and to ensure proper pain control. Plan ASA 356m BID for DVT ppx post-op. Plan Percocet for pain, which I have prescribed her now to take as needed leading into surgery, as well as Robaxin, Colace, Miralax. Plan home with HHPT post-op with family members at home for assistance. Will follow up 10-14 days post-op for suture removal and xrays. She will call with any questions or concerns in the interim. Again as noted in HPI, plan  to utilize Movantik, IV fluids with potassium.  If her rash is not improving within the next few days she is to let me know as we may need to postpone surgery if that does not clear up prior.  Plan left total knee replacement  BCecilie Kicks, PA-C for Dr. BTonita Cong2/27/2019, 3:47 PM

## 2017-11-19 MED ORDER — TRANEXAMIC ACID 1000 MG/10ML IV SOLN
1000.0000 mg | INTRAVENOUS | Status: AC
  Administered 2017-11-20: 1000 mg via INTRAVENOUS
  Filled 2017-11-19: qty 1100

## 2017-11-19 NOTE — Anesthesia Preprocedure Evaluation (Addendum)
Anesthesia Evaluation  Patient identified by MRN, date of birth, ID band Patient awake    Reviewed: Allergy & Precautions, NPO status , Patient's Chart, lab work & pertinent test results  History of Anesthesia Complications (+) PONV and history of anesthetic complications  Airway Mallampati: II  TM Distance: >3 FB Neck ROM: Full    Dental no notable dental hx.    Pulmonary neg pulmonary ROS,    Pulmonary exam normal breath sounds clear to auscultation       Cardiovascular hypertension, Pt. on medications Normal cardiovascular exam Rhythm:Regular Rate:Normal  ECG: NSR, LAFB   Neuro/Psych negative neurological ROS  negative psych ROS   GI/Hepatic negative GI ROS, Neg liver ROS,   Endo/Other  negative endocrine ROS  Renal/GU negative Renal ROS     Musculoskeletal  (+) Arthritis ,   Abdominal   Peds  Hematology negative hematology ROS (+)   Anesthesia Other Findings Left knee degenerative joint disease  Reproductive/Obstetrics                            Anesthesia Physical Anesthesia Plan  ASA: II  Anesthesia Plan: Regional and General   Post-op Pain Management: GA combined w/ Regional for post-op pain   Induction: Intravenous  PONV Risk Score and Plan: 4 or greater and Midazolam, Dexamethasone, Ondansetron, Treatment may vary due to age or medical condition and Scopolamine patch - Pre-op  Airway Management Planned: LMA  Additional Equipment:   Intra-op Plan:   Post-operative Plan: Extubation in OR  Informed Consent: I have reviewed the patients History and Physical, chart, labs and discussed the procedure including the risks, benefits and alternatives for the proposed anesthesia with the patient or authorized representative who has indicated his/her understanding and acceptance.   Dental advisory given  Plan Discussed with: CRNA  Anesthesia Plan Comments:         Anesthesia Quick Evaluation

## 2017-11-20 ENCOUNTER — Encounter (HOSPITAL_COMMUNITY)
Admission: RE | Disposition: A | Discharge status: Home Health | Payer: Self-pay | Source: Ambulatory Visit | Attending: Specialist

## 2017-11-20 ENCOUNTER — Encounter (HOSPITAL_COMMUNITY): Payer: Self-pay | Admitting: Emergency Medicine

## 2017-11-20 ENCOUNTER — Other Ambulatory Visit: Payer: Self-pay

## 2017-11-20 ENCOUNTER — Inpatient Hospital Stay (HOSPITAL_COMMUNITY): Payer: BLUE CROSS/BLUE SHIELD | Admitting: Anesthesiology

## 2017-11-20 ENCOUNTER — Inpatient Hospital Stay (HOSPITAL_COMMUNITY): Payer: BLUE CROSS/BLUE SHIELD

## 2017-11-20 ENCOUNTER — Inpatient Hospital Stay (HOSPITAL_COMMUNITY)
Admission: RE | Admit: 2017-11-20 | Discharge: 2017-11-23 | DRG: 470 | Disposition: A | Discharge status: Home Health | Payer: BLUE CROSS/BLUE SHIELD | Source: Ambulatory Visit | Attending: Specialist | Admitting: Specialist

## 2017-11-20 DIAGNOSIS — Z96651 Presence of right artificial knee joint: Secondary | ICD-10-CM | POA: Diagnosis present

## 2017-11-20 DIAGNOSIS — Z87440 Personal history of urinary (tract) infections: Secondary | ICD-10-CM

## 2017-11-20 DIAGNOSIS — M25762 Osteophyte, left knee: Secondary | ICD-10-CM | POA: Diagnosis present

## 2017-11-20 DIAGNOSIS — G8929 Other chronic pain: Secondary | ICD-10-CM | POA: Diagnosis present

## 2017-11-20 DIAGNOSIS — Z9049 Acquired absence of other specified parts of digestive tract: Secondary | ICD-10-CM | POA: Diagnosis not present

## 2017-11-20 DIAGNOSIS — M1712 Unilateral primary osteoarthritis, left knee: Secondary | ICD-10-CM | POA: Diagnosis present

## 2017-11-20 DIAGNOSIS — R21 Rash and other nonspecific skin eruption: Secondary | ICD-10-CM | POA: Diagnosis present

## 2017-11-20 DIAGNOSIS — Z96659 Presence of unspecified artificial knee joint: Secondary | ICD-10-CM

## 2017-11-20 DIAGNOSIS — Z888 Allergy status to other drugs, medicaments and biological substances status: Secondary | ICD-10-CM | POA: Diagnosis not present

## 2017-11-20 DIAGNOSIS — I10 Essential (primary) hypertension: Secondary | ICD-10-CM | POA: Diagnosis present

## 2017-11-20 DIAGNOSIS — Z90711 Acquired absence of uterus with remaining cervical stump: Secondary | ICD-10-CM | POA: Diagnosis not present

## 2017-11-20 HISTORY — PX: TOTAL KNEE ARTHROPLASTY: SHX125

## 2017-11-20 SURGERY — ARTHROPLASTY, KNEE, TOTAL
Anesthesia: Regional | Site: Knee | Laterality: Left

## 2017-11-20 MED ORDER — PROPOFOL 10 MG/ML IV BOLUS
INTRAVENOUS | Status: AC
  Filled 2017-11-20: qty 40

## 2017-11-20 MED ORDER — BUPIVACAINE-EPINEPHRINE 0.25% -1:200000 IJ SOLN
INTRAMUSCULAR | Status: DC | PRN
  Administered 2017-11-20: 30 mL

## 2017-11-20 MED ORDER — HYDROMORPHONE HCL 1 MG/ML IJ SOLN
0.5000 mg | INTRAMUSCULAR | Status: DC | PRN
  Administered 2017-11-21 – 2017-11-23 (×8): 1 mg via INTRAVENOUS
  Filled 2017-11-20 (×8): qty 1

## 2017-11-20 MED ORDER — METOCLOPRAMIDE HCL 5 MG PO TABS: 5.0000 mg | ORAL_TABLET | Freq: Three times a day (TID) | ORAL | Status: DC | PRN

## 2017-11-20 MED ORDER — DOCUSATE SODIUM 100 MG PO CAPS: 100.0000 mg | ORAL_CAPSULE | Freq: Two times a day (BID) | ORAL | 1 refills | Status: DC | PRN

## 2017-11-20 MED ORDER — NALOXEGOL OXALATE 12.5 MG PO TABS
12.5000 mg | ORAL_TABLET | Freq: Every day | ORAL | Status: DC
  Administered 2017-11-20 – 2017-11-22 (×3): 12.5 mg via ORAL
  Filled 2017-11-20 (×4): qty 1

## 2017-11-20 MED ORDER — ASPIRIN EC 325 MG PO TBEC: 325.0000 mg | DELAYED_RELEASE_TABLET | Freq: Two times a day (BID) | ORAL | 1 refills | Status: DC

## 2017-11-20 MED ORDER — METOCLOPRAMIDE HCL 5 MG/ML IJ SOLN: 5.0000 mg | Freq: Three times a day (TID) | INTRAMUSCULAR | Status: DC | PRN

## 2017-11-20 MED ORDER — FENTANYL CITRATE (PF) 100 MCG/2ML IJ SOLN
INTRAMUSCULAR | Status: DC | PRN
  Administered 2017-11-20: 50 ug via INTRAVENOUS
  Administered 2017-11-20: 100 ug via INTRAVENOUS
  Administered 2017-11-20 (×3): 50 ug via INTRAVENOUS

## 2017-11-20 MED ORDER — METHOCARBAMOL 500 MG PO TABS: 500.0000 mg | ORAL_TABLET | Freq: Four times a day (QID) | ORAL | 1 refills | Status: DC | PRN

## 2017-11-20 MED ORDER — HYDROMORPHONE HCL 1 MG/ML IJ SOLN
INTRAMUSCULAR | Status: AC
  Filled 2017-11-20: qty 1

## 2017-11-20 MED ORDER — MAGNESIUM CITRATE PO SOLN: 1.0000 | Freq: Once | ORAL | Status: DC | PRN

## 2017-11-20 MED ORDER — OXYCODONE HCL 5 MG/5ML PO SOLN
5.0000 mg | Freq: Once | ORAL | Status: DC | PRN
  Filled 2017-11-20: qty 5

## 2017-11-20 MED ORDER — BUPIVACAINE HCL (PF) 0.25 % IJ SOLN
INTRAMUSCULAR | Status: AC
  Filled 2017-11-20: qty 30

## 2017-11-20 MED ORDER — GABAPENTIN 300 MG PO CAPS
300.0000 mg | ORAL_CAPSULE | Freq: Three times a day (TID) | ORAL | Status: DC
  Administered 2017-11-20 – 2017-11-23 (×9): 300 mg via ORAL
  Filled 2017-11-20 (×9): qty 1

## 2017-11-20 MED ORDER — PROMETHAZINE HCL 25 MG/ML IJ SOLN
6.2500 mg | INTRAMUSCULAR | Status: DC | PRN
  Administered 2017-11-20: 6.25 mg via INTRAVENOUS

## 2017-11-20 MED ORDER — ACETAMINOPHEN 325 MG PO TABS: 325.0000 mg | ORAL_TABLET | Freq: Four times a day (QID) | ORAL | Status: DC | PRN

## 2017-11-20 MED ORDER — LACTATED RINGERS IV SOLN
INTRAVENOUS | Status: DC
  Administered 2017-11-20 (×2): via INTRAVENOUS

## 2017-11-20 MED ORDER — FENTANYL CITRATE (PF) 100 MCG/2ML IJ SOLN
INTRAMUSCULAR | Status: AC
  Filled 2017-11-20: qty 2

## 2017-11-20 MED ORDER — LIP MEDEX EX OINT
TOPICAL_OINTMENT | CUTANEOUS | Status: AC
  Filled 2017-11-20: qty 7

## 2017-11-20 MED ORDER — LIDOCAINE 2% (20 MG/ML) 5 ML SYRINGE
INTRAMUSCULAR | Status: DC | PRN
  Administered 2017-11-20: 100 mg via INTRAVENOUS

## 2017-11-20 MED ORDER — ONDANSETRON HCL 4 MG/2ML IJ SOLN
INTRAMUSCULAR | Status: DC | PRN
  Administered 2017-11-20: 4 mg via INTRAVENOUS

## 2017-11-20 MED ORDER — POLYETHYLENE GLYCOL 3350 17 G PO PACK: 17.0000 g | PACK | Freq: Every day | ORAL | Status: DC | PRN

## 2017-11-20 MED ORDER — METHOCARBAMOL 1000 MG/10ML IJ SOLN
500.0000 mg | Freq: Four times a day (QID) | INTRAMUSCULAR | Status: DC | PRN
  Administered 2017-11-20 – 2017-11-21 (×2): 500 mg via INTRAVENOUS
  Filled 2017-11-20 (×2): qty 550

## 2017-11-20 MED ORDER — ASPIRIN EC 325 MG PO TBEC
325.0000 mg | DELAYED_RELEASE_TABLET | Freq: Two times a day (BID) | ORAL | Status: DC
  Administered 2017-11-20 – 2017-11-23 (×6): 325 mg via ORAL
  Filled 2017-11-20 (×6): qty 1

## 2017-11-20 MED ORDER — ALUM & MAG HYDROXIDE-SIMETH 200-200-20 MG/5ML PO SUSP: 30.0000 mL | ORAL | Status: DC | PRN

## 2017-11-20 MED ORDER — HYDROMORPHONE HCL 1 MG/ML IJ SOLN
0.2500 mg | INTRAMUSCULAR | Status: DC | PRN
  Administered 2017-11-20 (×2): 0.25 mg via INTRAVENOUS
  Administered 2017-11-20: 0.5 mg via INTRAVENOUS
  Administered 2017-11-20: 0.25 mg via INTRAVENOUS
  Administered 2017-11-20: 0.5 mg via INTRAVENOUS
  Administered 2017-11-20: 0.25 mg via INTRAVENOUS

## 2017-11-20 MED ORDER — MIDAZOLAM HCL 2 MG/2ML IJ SOLN
INTRAMUSCULAR | Status: AC
  Filled 2017-11-20: qty 2

## 2017-11-20 MED ORDER — ROPIVACAINE HCL 7.5 MG/ML IJ SOLN
INTRAMUSCULAR | Status: DC | PRN
  Administered 2017-11-20: 20 mL via PERINEURAL

## 2017-11-20 MED ORDER — MIDAZOLAM HCL 5 MG/5ML IJ SOLN
INTRAMUSCULAR | Status: DC | PRN
  Administered 2017-11-20: 2 mg via INTRAVENOUS

## 2017-11-20 MED ORDER — CEFAZOLIN SODIUM-DEXTROSE 2-4 GM/100ML-% IV SOLN
2.0000 g | INTRAVENOUS | Status: AC
  Administered 2017-11-20: 2 g via INTRAVENOUS
  Filled 2017-11-20: qty 100

## 2017-11-20 MED ORDER — DOCUSATE SODIUM 100 MG PO CAPS
100.0000 mg | ORAL_CAPSULE | Freq: Two times a day (BID) | ORAL | Status: DC
  Administered 2017-11-20 – 2017-11-23 (×6): 100 mg via ORAL
  Filled 2017-11-20 (×6): qty 1

## 2017-11-20 MED ORDER — DEXAMETHASONE SODIUM PHOSPHATE 10 MG/ML IJ SOLN
INTRAMUSCULAR | Status: DC | PRN
  Administered 2017-11-20: 10 mg via INTRAVENOUS

## 2017-11-20 MED ORDER — PROMETHAZINE HCL 25 MG/ML IJ SOLN
INTRAMUSCULAR | Status: AC
  Administered 2017-11-20: 6.25 mg via INTRAVENOUS
  Filled 2017-11-20: qty 1

## 2017-11-20 MED ORDER — MENTHOL 3 MG MT LOZG: 1.0000 | LOZENGE | OROMUCOSAL | Status: DC | PRN

## 2017-11-20 MED ORDER — ONDANSETRON HCL 4 MG/2ML IJ SOLN: 4.0000 mg | Freq: Four times a day (QID) | INTRAMUSCULAR | Status: DC | PRN

## 2017-11-20 MED ORDER — DIPHENHYDRAMINE HCL 12.5 MG/5ML PO ELIX: 12.5000 mg | ORAL_SOLUTION | ORAL | Status: DC | PRN

## 2017-11-20 MED ORDER — PHENOL 1.4 % MT LIQD: 1.0000 | OROMUCOSAL | Status: DC | PRN

## 2017-11-20 MED ORDER — IRBESARTAN 150 MG PO TABS
300.0000 mg | ORAL_TABLET | Freq: Every day | ORAL | Status: DC
  Administered 2017-11-22 – 2017-11-23 (×2): 300 mg via ORAL
  Filled 2017-11-20 (×2): qty 2

## 2017-11-20 MED ORDER — BISACODYL 5 MG PO TBEC: 5.0000 mg | DELAYED_RELEASE_TABLET | Freq: Every day | ORAL | Status: DC | PRN

## 2017-11-20 MED ORDER — SODIUM CHLORIDE 0.9 % IV SOLN
INTRAVENOUS | Status: DC | PRN
  Administered 2017-11-20: 500 mL

## 2017-11-20 MED ORDER — ACETAMINOPHEN 10 MG/ML IV SOLN
1000.0000 mg | INTRAVENOUS | Status: AC
  Administered 2017-11-20: 1000 mg via INTRAVENOUS
  Filled 2017-11-20: qty 100

## 2017-11-20 MED ORDER — OXYCODONE HCL 5 MG PO TABS
5.0000 mg | ORAL_TABLET | ORAL | Status: DC | PRN
  Administered 2017-11-20 (×2): 5 mg via ORAL
  Administered 2017-11-20: 10 mg via ORAL
  Filled 2017-11-20: qty 1
  Filled 2017-11-20: qty 2
  Filled 2017-11-20: qty 1

## 2017-11-20 MED ORDER — ONDANSETRON HCL 4 MG PO TABS: 4.0000 mg | ORAL_TABLET | Freq: Four times a day (QID) | ORAL | Status: DC | PRN

## 2017-11-20 MED ORDER — OXYCODONE HCL 5 MG PO TABS: 5.0000 mg | ORAL_TABLET | Freq: Once | ORAL | Status: DC | PRN

## 2017-11-20 MED ORDER — PHENYLEPHRINE 40 MCG/ML (10ML) SYRINGE FOR IV PUSH (FOR BLOOD PRESSURE SUPPORT)
PREFILLED_SYRINGE | INTRAVENOUS | Status: DC | PRN
  Administered 2017-11-20: 40 ug via INTRAVENOUS
  Administered 2017-11-20 (×2): 80 ug via INTRAVENOUS
  Administered 2017-11-20: 120 ug via INTRAVENOUS
  Administered 2017-11-20: 80 ug via INTRAVENOUS

## 2017-11-20 MED ORDER — PHENYLEPHRINE 40 MCG/ML (10ML) SYRINGE FOR IV PUSH (FOR BLOOD PRESSURE SUPPORT)
PREFILLED_SYRINGE | INTRAVENOUS | Status: AC
  Filled 2017-11-20: qty 10

## 2017-11-20 MED ORDER — EST ESTROGENS-METHYLTEST 1.25-2.5 MG PO TABS
1.0000 | ORAL_TABLET | ORAL | Status: DC
  Administered 2017-11-22: 1 via ORAL

## 2017-11-20 MED ORDER — HYDROMORPHONE HCL 1 MG/ML IJ SOLN
0.5000 mg | INTRAMUSCULAR | Status: DC | PRN
  Administered 2017-11-20 (×2): 0.5 mg via INTRAVENOUS
  Filled 2017-11-20 (×3): qty 1

## 2017-11-20 MED ORDER — PHENYLEPHRINE HCL 10 MG/ML IJ SOLN
INTRAMUSCULAR | Status: AC
  Filled 2017-11-20: qty 1

## 2017-11-20 MED ORDER — HYDROMORPHONE HCL 1 MG/ML IJ SOLN
INTRAMUSCULAR | Status: AC
  Administered 2017-11-20: 0.25 mg via INTRAVENOUS
  Filled 2017-11-20: qty 1

## 2017-11-20 MED ORDER — TRIAMTERENE-HCTZ 75-50 MG PO TABS
0.5000 | ORAL_TABLET | Freq: Every day | ORAL | Status: DC
  Filled 2017-11-20 (×2): qty 0.5

## 2017-11-20 MED ORDER — KCL IN DEXTROSE-NACL 40-5-0.45 MEQ/L-%-% IV SOLN
INTRAVENOUS | Status: DC
  Administered 2017-11-20: 16:00:00 via INTRAVENOUS
  Filled 2017-11-20 (×2): qty 1000

## 2017-11-20 MED ORDER — LACTINEX PO CHEW
1.0000 | CHEWABLE_TABLET | Freq: Every day | ORAL | Status: DC
  Administered 2017-11-20 – 2017-11-21 (×2): 1 via ORAL
  Filled 2017-11-20 (×4): qty 1

## 2017-11-20 MED ORDER — SODIUM CHLORIDE 0.9 % IV SOLN
INTRAVENOUS | Status: AC
  Filled 2017-11-20: qty 500000

## 2017-11-20 MED ORDER — SODIUM CHLORIDE 0.9 % IR SOLN
Status: DC | PRN
  Administered 2017-11-20: 1

## 2017-11-20 MED ORDER — PROPOFOL 10 MG/ML IV BOLUS
INTRAVENOUS | Status: DC | PRN
  Administered 2017-11-20: 200 mg via INTRAVENOUS

## 2017-11-20 MED ORDER — OXYCODONE-ACETAMINOPHEN 5-325 MG PO TABS: 1.0000 | ORAL_TABLET | ORAL | 0 refills | Status: DC | PRN

## 2017-11-20 MED ORDER — OXYCODONE HCL 5 MG PO TABS
10.0000 mg | ORAL_TABLET | ORAL | Status: DC | PRN
  Administered 2017-11-21 (×3): 15 mg via ORAL
  Filled 2017-11-20 (×3): qty 3

## 2017-11-20 MED ORDER — METHOCARBAMOL 500 MG PO TABS
500.0000 mg | ORAL_TABLET | Freq: Four times a day (QID) | ORAL | Status: DC | PRN
  Administered 2017-11-20 – 2017-11-23 (×7): 500 mg via ORAL
  Filled 2017-11-20 (×7): qty 1

## 2017-11-20 MED ORDER — PHENYLEPHRINE HCL 10 MG/ML IJ SOLN
INTRAVENOUS | Status: DC | PRN
  Administered 2017-11-20: 25 ug/min via INTRAVENOUS

## 2017-11-20 MED ORDER — POLYETHYLENE GLYCOL 3350 17 G PO PACK: 17.0000 g | PACK | Freq: Every day | ORAL | 0 refills | Status: DC

## 2017-11-20 MED ORDER — ACETAMINOPHEN 500 MG PO TABS
1000.0000 mg | ORAL_TABLET | Freq: Four times a day (QID) | ORAL | Status: AC
  Administered 2017-11-20 – 2017-11-21 (×4): 1000 mg via ORAL
  Filled 2017-11-20 (×4): qty 2

## 2017-11-20 MED ORDER — OXYCODONE HCL 5 MG PO TABS
5.0000 mg | ORAL_TABLET | ORAL | Status: DC | PRN
  Administered 2017-11-21 (×2): 10 mg via ORAL
  Filled 2017-11-20 (×3): qty 2

## 2017-11-20 MED ORDER — CEFAZOLIN SODIUM-DEXTROSE 2-4 GM/100ML-% IV SOLN
2.0000 g | Freq: Four times a day (QID) | INTRAVENOUS | Status: AC
  Administered 2017-11-20 (×2): 2 g via INTRAVENOUS
  Filled 2017-11-20 (×2): qty 100

## 2017-11-20 SURGICAL SUPPLY — 67 items
BAG ZIPLOCK 12X15 (MISCELLANEOUS) IMPLANT
BANDAGE ACE 4X5 VEL STRL LF (GAUZE/BANDAGES/DRESSINGS) ×2 IMPLANT
BANDAGE ACE 6X5 VEL STRL LF (GAUZE/BANDAGES/DRESSINGS) ×2 IMPLANT
BANDAGE ELASTIC 6 VELCRO ST LF (GAUZE/BANDAGES/DRESSINGS) ×2 IMPLANT
BLADE SAG 18X100X1.27 (BLADE) ×2 IMPLANT
BLADE SAW SGTL 11.0X1.19X90.0M (BLADE) ×2 IMPLANT
BLADE SAW SGTL 13.0X1.19X90.0M (BLADE) ×2 IMPLANT
CAPT KNEE TOTAL 3 ATTUNE ×2 IMPLANT
CEMENT HV SMART SET (Cement) ×4 IMPLANT
CLOTH 2% CHLOROHEXIDINE 3PK (PERSONAL CARE ITEMS) ×2 IMPLANT
COVER SURGICAL LIGHT HANDLE (MISCELLANEOUS) ×2 IMPLANT
CUFF TOURN SGL QUICK 34 (TOURNIQUET CUFF) ×1
CUFF TRNQT CYL 34X4X40X1 (TOURNIQUET CUFF) ×1 IMPLANT
DECANTER SPIKE VIAL GLASS SM (MISCELLANEOUS) ×2 IMPLANT
DRAPE INCISE IOBAN 66X45 STRL (DRAPES) ×2 IMPLANT
DRAPE ORTHO SPLIT 77X108 STRL (DRAPES) ×2
DRAPE SHEET LG 3/4 BI-LAMINATE (DRAPES) ×2 IMPLANT
DRAPE SURG ORHT 6 SPLT 77X108 (DRAPES) ×2 IMPLANT
DRAPE TOP 10253 STERILE (DRAPES) IMPLANT
DRAPE U-SHAPE 47X51 STRL (DRAPES) ×2 IMPLANT
DRESSING AQUACEL AG ADV 3.5X12 (MISCELLANEOUS) IMPLANT
DRSG AQUACEL AG ADV 3.5X10 (GAUZE/BANDAGES/DRESSINGS) IMPLANT
DRSG AQUACEL AG ADV 3.5X12 (MISCELLANEOUS)
DRSG AQUACEL AG ADV 3.5X14 (GAUZE/BANDAGES/DRESSINGS) ×2 IMPLANT
DRSG TEGADERM 4X4.75 (GAUZE/BANDAGES/DRESSINGS) IMPLANT
DURAPREP 26ML APPLICATOR (WOUND CARE) ×2 IMPLANT
ELECT REM PT RETURN 15FT ADLT (MISCELLANEOUS) ×2 IMPLANT
EVACUATOR 1/8 PVC DRAIN (DRAIN) IMPLANT
GAUZE SPONGE 2X2 8PLY STRL LF (GAUZE/BANDAGES/DRESSINGS) IMPLANT
GLOVE BIO SURGEON STRL SZ8 (GLOVE) ×8 IMPLANT
GLOVE BIOGEL PI IND STRL 7.0 (GLOVE) ×1 IMPLANT
GLOVE BIOGEL PI IND STRL 7.5 (GLOVE) ×4 IMPLANT
GLOVE BIOGEL PI IND STRL 8 (GLOVE) ×4 IMPLANT
GLOVE BIOGEL PI INDICATOR 7.0 (GLOVE) ×1
GLOVE BIOGEL PI INDICATOR 7.5 (GLOVE) ×4
GLOVE BIOGEL PI INDICATOR 8 (GLOVE) ×4
GLOVE SURG SS PI 7.0 STRL IVOR (GLOVE) ×2 IMPLANT
GLOVE SURG SS PI 7.5 STRL IVOR (GLOVE) ×2 IMPLANT
GLOVE SURG SS PI 8.0 STRL IVOR (GLOVE) ×4 IMPLANT
GOWN STRL REUS W/TWL XL LVL3 (GOWN DISPOSABLE) ×4 IMPLANT
HANDPIECE INTERPULSE COAX TIP (DISPOSABLE) ×1
HEMOSTAT SPONGE AVITENE ULTRA (HEMOSTASIS) ×2 IMPLANT
IMMOBILIZER KNEE 20 (SOFTGOODS) ×4 IMPLANT
IMMOBILIZER KNEE 20 THIGH 36 (SOFTGOODS) ×1 IMPLANT
MANIFOLD NEPTUNE II (INSTRUMENTS) ×2 IMPLANT
NS IRRIG 1000ML POUR BTL (IV SOLUTION) IMPLANT
PACK TOTAL KNEE CUSTOM (KITS) ×2 IMPLANT
POSITIONER SURGICAL ARM (MISCELLANEOUS) ×2 IMPLANT
SET HNDPC FAN SPRY TIP SCT (DISPOSABLE) ×1 IMPLANT
SPONGE GAUZE 2X2 STER 10/PKG (GAUZE/BANDAGES/DRESSINGS)
SPONGE SURGIFOAM ABS GEL 100 (HEMOSTASIS) IMPLANT
STAPLER VISISTAT (STAPLE) IMPLANT
STRIP CLOSURE SKIN 1/2X4 (GAUZE/BANDAGES/DRESSINGS) ×6 IMPLANT
SUT BONE WAX W31G (SUTURE) IMPLANT
SUT MNCRL AB 4-0 PS2 18 (SUTURE) IMPLANT
SUT STRATAFIX 0 PDS 27 VIOLET (SUTURE) ×2
SUT VIC AB 1 CT1 27 (SUTURE) ×2
SUT VIC AB 1 CT1 27XBRD ANTBC (SUTURE) ×2 IMPLANT
SUT VIC AB 2-0 CT1 27 (SUTURE) ×3
SUT VIC AB 2-0 CT1 TAPERPNT 27 (SUTURE) ×3 IMPLANT
SUTURE STRATFX 0 PDS 27 VIOLET (SUTURE) ×1 IMPLANT
SYR 50ML LL SCALE MARK (SYRINGE) IMPLANT
TOWER CARTRIDGE SMART MIX (DISPOSABLE) ×2 IMPLANT
TRAY FOLEY W/METER SILVER 16FR (SET/KITS/TRAYS/PACK) ×2 IMPLANT
WATER STERILE IRR 1000ML POUR (IV SOLUTION) ×2 IMPLANT
WRAP KNEE MAXI GEL POST OP (GAUZE/BANDAGES/DRESSINGS) ×2 IMPLANT
YANKAUER SUCT BULB TIP 10FT TU (MISCELLANEOUS) ×2 IMPLANT

## 2017-11-20 NOTE — Anesthesia Procedure Notes (Signed)
Anesthesia Regional Block: Adductor canal block   Pre-Anesthetic Checklist: ,, timeout performed, Correct Patient, Correct Site, Correct Laterality, Correct Procedure,, site marked, risks and benefits discussed, Surgical consent,  Pre-op evaluation,  At surgeon's request and post-op pain management  Laterality: Left  Prep: chloraprep       Needles:  Injection technique: Single-shot  Needle Type: Echogenic Stimulator Needle     Needle Length: 9cm  Needle Gauge: 21     Additional Needles:   Procedures:,,,, ultrasound used (permanent image in chart),,,,  Narrative:  Start time: 11/20/2017 8:10 AM End time: 11/20/2017 8:20 AM Injection made incrementally with aspirations every 5 mL.  Performed by: Personally  Anesthesiologist: Murvin Natal, MD  Additional Notes: Functioning IV was confirmed and monitors were applied.  A 55mm 21ga Arrow echogenic stimulator needle was used. Sterile prep, hand hygiene and sterile gloves were used.  Negative aspiration and negative test dose prior to incremental administration of local anesthetic. The patient tolerated the procedure well.

## 2017-11-20 NOTE — Discharge Instructions (Signed)

## 2017-11-20 NOTE — Op Note (Signed)
Laura, Hahn NO.:  [id number]  MEDICAL RECORD NO.:  [id number]  LOCATION:                                 FACILITY:  PHYSICIAN:  Susa Day, M.D.         DATE OF BIRTH:  DATE OF PROCEDURE:  11/20/2017 DATE OF DISCHARGE:                              OPERATIVE REPORT   PREOPERATIVE DIAGNOSIS:  Degenerative joint disease, end-stage osteoarthrosis of the left knee.  POSTOPERATIVE DIAGNOSIS:  Degenerative joint disease, end-stage osteoarthrosis of the left knee.  PROCEDURE PERFORMED:  Left total knee arthroplasty.  COMPONENTS UTILIZED:  DePuy Attune 7 femur, 7 tibia, 5 insert, 38 patella.  ANESTHESIA:  General.  ASSISTANT:  Lacie Draft, PA.  HISTORY:  A 67 year old bone-on-bone arthrosis, medial compartment, patellofemoral joint refractory to conservative treatment, indicated for replacement of degenerated joint.  Risks and benefits were discussed including bleeding, infection, damage to the neurovascular structures, suboptimal range of motion, DVT, PE, anesthetic complications, component failure, etc.  TECHNIQUE:  With the patient in supine position after induction of adequate general anesthesia, 2 g Kefzol, left lower extremity was prepped, draped, and exsanguinated in usual sterile fashion.  Thigh tourniquet inflated at 225 mmHg.  Midline incision was made over the knee.  Full-thickness flaps developed.  Median parapatellar arthrotomy performed.  Patella everted.  Soft tissues elevated medially preserving the MCL.  Patella everted and knee flexed.  Tricompartmental osteoarthrosis particularly medial compartment bone-on-bone was noted. Osteophytes removed with a rongeur.  Remnants of the medial and lateral menisci and the ACL were removed as well.  Step drill was utilized to enter the femoral canal.  It was irrigated to 5-degree left, was utilized with 9 off the distal femur pin.  Distal femoral cut performed, sized the femur off the  anterior cortex to a 7, pinned in 3 degrees of external rotation.  Used a femoral block performed anterior, posterior, and chamfer cuts with soft tissues protected posteriorly at all times. Subluxed the tibia, defect posteromedially throughout that defect, external alignment guide, bisecting the tibiotalar joint 3 degree slope parallel to the shaft, pinned, performed our cut.  We then trialed our extension block, it was satisfactory at 5 mm.  Knee was then flexed, tibia subluxed, measured to a 7.  Pinned the bone graft harvested from the proximal tibia, drilled it centrally used our punch guide.  Turned our attention back to the femur, placed our box cut, bisecting the canal with a 7.  This was then performed.  I placed a trial femur, drilled our lug holes.  Trial tibia 5 mm insert reduced it, had full extension, full flexion, good stability, varus and valgus stressing 0-30 degrees, negative anterior drawer.  Attention turned towards the patella, it was measured at a 24, planed to a 15 utilizing external guide parallel. Drilled our PEG holes medializing those, sized to a 38.  Placed a trial patella, reduced it and had excellent patellofemoral tracking.  All trials were removed.  We checked posteriorly remnants of menisci removed, cauterized the geniculates.  Pulsatile lavage was used to clean the joint, knee flexed, all surfaces thoroughly dried, tibia subluxed, mixed cement on back table in appropriate fashion, injected  in the proximal tibia digitally pressurizing it.  I then used a 7 tibia tray with cement on the tray impacted it, redundant cement removed.  We cemented and impacted the femur, redundant cement removed.  Trial 5 insert in place, reduced and held an axial load in extension throughout the curing of the cement, the cement had impacted and clamped the patella.  After curing of the cement and 0.25% Marcaine on the periosteum and antibiotic irrigation in the joint, we then  released the tourniquet at 58 minutes.  Blood vessels cauterized.  Excellent patellofemoral tracking.  Full extension, full flexion.  Removed the 5 insert, meticulously removed redundant cement, copiously irrigated the wound with pulsatile lavage, antibiotic irrigation, placed the permanent 5 insert reduced it, had full extension, full flexion, good stability, varus and valgus stressing 0-30 degrees.  Negative anterior drawer.  I then with the knee in slight flexion reapproximated the patellar arthrotomy and closed it with #1 Vicryl interrupted figure-of-eight sutures oversewn with a Stratafix.  Subcu with 2-0 and skin with Monocryl.  Sterile dressing applied, placed in immobilizer, extubated without difficulty and transported to the recovery room in satisfactory condition.  The patient tolerated the procedure well.  No complications.  Minimal blood loss.     Susa Day, M.D.     Geralynn Rile  D:  11/20/2017  T:  11/20/2017  Job:  [id number]

## 2017-11-20 NOTE — Transfer of Care (Signed)
Immediate Anesthesia Transfer of Care Note  Patient: Laura Hahn  Procedure(s) Performed: TOTAL LEFT KNEE ARTHROPLASTY (Left Knee)  Patient Location: PACU  Anesthesia Type:General and Regional  Level of Consciousness: sedated  Airway & Oxygen Therapy: Patient Spontanous Breathing and Patient connected to face mask oxygen  Post-op Assessment: Report given to RN and Post -op Vital signs reviewed and stable  Post vital signs: Reviewed and stable  Last Vitals:  Vitals:   11/20/17 0652  BP: 121/81  Pulse: 78  Resp: 12  Temp: 36.8 C  SpO2: 96%    Last Pain:  Vitals:   11/20/17 0652  TempSrc: Oral      Patients Stated Pain Goal: 4 (57/97/28 2060)  Complications: No apparent anesthesia complications

## 2017-11-20 NOTE — Anesthesia Postprocedure Evaluation (Signed)
Anesthesia Post Note  Patient: Laura Hahn  Procedure(s) Performed: TOTAL LEFT KNEE ARTHROPLASTY (Left Knee)     Patient location during evaluation: PACU Anesthesia Type: Regional and General Level of consciousness: awake and alert Pain management: pain level controlled Vital Signs Assessment: post-procedure vital signs reviewed and stable Respiratory status: spontaneous breathing, nonlabored ventilation, respiratory function stable and patient connected to nasal cannula oxygen Cardiovascular status: blood pressure returned to baseline and stable Postop Assessment: no apparent nausea or vomiting Anesthetic complications: no    Last Vitals:  Vitals:   11/20/17 1315 11/20/17 1415  BP: (!) 148/93 135/81  Pulse: 98 98  Resp: 16 17  Temp: 36.8 C 37.1 C  SpO2: 95% 97%    Last Pain:  Vitals:   11/20/17 1415  TempSrc: Oral  PainSc:                  Johonna Binette P Susumu Hackler

## 2017-11-20 NOTE — Progress Notes (Signed)
Report received from Turner, RN at this time. Care assumed for this pt at this time. VS and orders assessed and pt is alert, clam and talkative and has no s/s of distress. Will continue to monitor and tx pt according to MD orders.

## 2017-11-20 NOTE — Brief Op Note (Signed)
11/20/2017  10:20 AM  PATIENT:  Laura Hahn  67 y.o. female  PRE-OPERATIVE DIAGNOSIS:  Left knee degenerative joint disease  POST-OPERATIVE DIAGNOSIS:  Left knee degenerative joint disease  PROCEDURE:  Procedure(s): TOTAL LEFT KNEE ARTHROPLASTY (Left)  SURGEON:  Surgeon(s) and Role:    Susa Day, MD - Primary  PHYSICIAN ASSISTANT:   ASSISTANTS: Bissell   ANESTHESIA:   general  EBL:  50 mL   BLOOD ADMINISTERED:none  DRAINS: none   LOCAL MEDICATIONS USED:  MARCAINE     SPECIMEN:  No Specimen  DISPOSITION OF SPECIMEN:  N/A  COUNTS:  YES  TOURNIQUET:   Total Tourniquet Time Documented: Thigh (Left) - 58 minutes Total: Thigh (Left) - 58 minutes   DICTATION: .Other Dictation: Dictation Number   [id number]  PLAN OF CARE: Admit to inpatient   PATIENT DISPOSITION:  PACU - hemodynamically stable.   Delay start of Pharmacological VTE agent (>24hrs) due to surgical blood loss or risk of bleeding: no

## 2017-11-20 NOTE — Evaluation (Signed)
Physical Therapy Evaluation Patient Details Name: Laura Hahn MRN: [id number] DOB: 1951/08/13 Today's Date: 11/20/2017   History of Present Illness  Patient is a 67 y/o female s/p L TKA (h/o R TKA)  Clinical Impression  Patient presents with decreased mobility due to deficits listed in PT problem list.  She will benefit from skilled PT in the acute setting to allow return home with family support and follow up HHPT.    Follow Up Recommendations Home health PT;Supervision/Assistance - 24 hour    Equipment Recommendations  Rolling walker with 5" wheels    Recommendations for Other Services       Precautions / Restrictions Precautions Precautions: Fall Required Braces or Orthoses: Knee Immobilizer - Left Restrictions Weight Bearing Restrictions: Yes LLE Weight Bearing: Weight bearing as tolerated      Mobility  Bed Mobility Overal bed mobility: Needs Assistance Bed Mobility: Supine to Sit     Supine to sit: Min guard        Transfers Overall transfer level: Needs assistance Equipment used: Rolling walker (2 wheeled) Transfers: Sit to/from [facility] Sit to Stand: Min assist Stand pivot transfers: Min assist       General transfer comment: for balance, safety; to chair only due to shaky, nausea  Ambulation/Gait                Stairs            Wheelchair Mobility    Modified Rankin (Stroke Patients Only)       Balance Overall balance assessment: Mild deficits observed, not formally tested                                           Pertinent Vitals/Pain Pain Assessment: 0-10 Pain Score: 8  Pain Location: L knee Pain Descriptors / Indicators: Sore Pain Intervention(s): Monitored during session;Repositioned;RN gave pain meds during session    Home Living Family/patient expects to be discharged to:: Private residence Living Arrangements: Spouse/significant other Available Help at Discharge:  Family;Available 24 hours/day Type of Home: House Home Access: Level entry     Home Layout: One level Home Equipment: None      Prior Function Level of Independence: Independent               Hand Dominance        Extremity/Trunk Assessment   Upper Extremity Assessment Upper Extremity Assessment: Overall WFL for tasks assessed    Lower Extremity Assessment Lower Extremity Assessment: LLE deficits/detail LLE Deficits / Details: AAROM grossly -20 to 80 knee, strength at least 3/5       Communication   Communication: No difficulties  Cognition Arousal/Alertness: Awake/alert Behavior During Therapy: WFL for tasks assessed/performed;Anxious Overall Cognitive Status: Within Functional Limits for tasks assessed                                        General Comments      Exercises Total Joint Exercises Ankle Circles/Pumps: AROM;10 reps;Both;Supine Quad Sets: AROM;Left;Supine;10 reps Heel Slides: AROM;Left;Supine;5 reps   Assessment/Plan    PT Assessment Patient needs continued PT services  PT Problem List Decreased strength;Decreased balance;Decreased range of motion;Decreased mobility;Decreased activity tolerance;Decreased knowledge of use of DME;Pain       PT Treatment Interventions Stair training;Therapeutic exercise;Gait training;Therapeutic activities;DME  instruction;Functional mobility training;Balance training;Patient/family education    PT Goals (Current goals can be found in the Care Plan section)  Acute Rehab PT Goals Patient Stated Goal: to go home PT Goal Formulation: With patient/family Time For Goal Achievement: 11/27/17 Potential to Achieve Goals: Good    Frequency 7X/week   Barriers to discharge        Co-evaluation               AM-PAC PT "6 Clicks" Daily Activity  Outcome Measure Difficulty turning over in bed (including adjusting bedclothes, sheets and blankets)?: A Little Difficulty moving from lying on  back to sitting on the side of the bed? : A Little Difficulty sitting down on and standing up from a chair with arms (e.g., wheelchair, bedside commode, etc,.)?: Unable Help needed moving to and from a bed to chair (including a wheelchair)?: A Little Help needed walking in hospital room?: A Little Help needed climbing 3-5 steps with a railing? : A Little 6 Click Score: 16    End of Session Equipment Utilized During Treatment: Gait belt Activity Tolerance: Patient limited by fatigue Patient left: in chair;with family/visitor present;with call bell/phone within reach   PT Visit Diagnosis: Difficulty in walking, not elsewhere classified (R26.2);Pain Pain - Right/Left: Left Pain - part of body: Knee    Time: 8250-0370 PT Time Calculation (min) (ACUTE ONLY): 28 min   Charges:   PT Evaluation $PT Eval Low Complexity: 1 Low PT Treatments $Therapeutic Activity: 8-22 mins   PT G CodesMagda Kiel, [place] [phone] 11/20/2017   Reginia Naas 11/20/2017, 5:32 PM

## 2017-11-20 NOTE — Interval H&P Note (Signed)
History and Physical Interval Note:  11/20/2017 8:11 AM  Laura Hahn  has presented today for surgery, with the diagnosis of Left knee degenerative joint disease  The various methods of treatment have been discussed with the patient and family. After consideration of risks, benefits and other options for treatment, the patient has consented to  Procedure(s): TOTAL LEFT KNEE ARTHROPLASTY (Left) as a surgical intervention .  The patient's history has been reviewed, patient examined, no change in status, stable for surgery.  I have reviewed the patient's chart and labs.  Questions were answered to the patient's satisfaction.     [name]

## 2017-11-20 NOTE — Anesthesia Procedure Notes (Signed)
Procedure Name: LMA Insertion Date/Time: 11/20/2017 8:33 AM Performed by: Lind Covert, CRNA Pre-anesthesia Checklist: Patient identified, Emergency Drugs available, Suction available, Patient being monitored and Timeout performed Patient Re-evaluated:Patient Re-evaluated prior to induction Oxygen Delivery Method: Circle system utilized Preoxygenation: Pre-oxygenation with 100% oxygen Induction Type: IV induction Ventilation: Mask ventilation without difficulty LMA: LMA inserted LMA Size: 4.0 Number of attempts: 1 Placement Confirmation: positive ETCO2 and breath sounds checked- equal and bilateral Tube secured with: Tape Dental Injury: Teeth and Oropharynx as per pre-operative assessment

## 2017-11-20 NOTE — Plan of Care (Signed)
Plan of care 

## 2017-11-21 LAB — CBC
HCT: 36 % (ref 36.0–46.0)
HEMOGLOBIN: 12.2 g/dL (ref 12.0–15.0)
MCH: 33.8 pg (ref 26.0–34.0)
MCHC: 33.9 g/dL (ref 30.0–36.0)
MCV: 99.7 fL (ref 78.0–100.0)
PLATELETS: 222 10*3/uL (ref 150–400)
RBC: 3.61 MIL/uL — ABNORMAL LOW (ref 3.87–5.11)
RDW: 12 % (ref 11.5–15.5)
WBC: 9.3 10*3/uL (ref 4.0–10.5)

## 2017-11-21 LAB — BASIC METABOLIC PANEL
Anion gap: 7 (ref 5–15)
BUN: 11 mg/dL (ref 6–20)
CALCIUM: 9 mg/dL (ref 8.9–10.3)
CHLORIDE: 97 mmol/L — AB (ref 101–111)
CO2: 27 mmol/L (ref 22–32)
CREATININE: 0.79 mg/dL (ref 0.44–1.00)
Glucose, Bld: 128 mg/dL — ABNORMAL HIGH (ref 65–99)
Potassium: 4.1 mmol/L (ref 3.5–5.1)
SODIUM: 131 mmol/L — AB (ref 135–145)

## 2017-11-21 MED ORDER — LORAZEPAM 1 MG PO TABS: 1.0000 mg | ORAL_TABLET | Freq: Four times a day (QID) | ORAL | Status: DC | PRN

## 2017-11-21 MED ORDER — ACETAMINOPHEN 500 MG PO TABS
1000.0000 mg | ORAL_TABLET | Freq: Four times a day (QID) | ORAL | Status: AC
  Administered 2017-11-21 – 2017-11-22 (×4): 1000 mg via ORAL
  Filled 2017-11-21 (×4): qty 2

## 2017-11-21 MED ORDER — HYDROMORPHONE HCL 2 MG PO TABS
2.0000 mg | ORAL_TABLET | ORAL | Status: DC | PRN
  Administered 2017-11-21: 4 mg via ORAL
  Administered 2017-11-21: 2 mg via ORAL
  Administered 2017-11-22 – 2017-11-23 (×8): 4 mg via ORAL
  Filled 2017-11-21 (×6): qty 2
  Filled 2017-11-21: qty 1
  Filled 2017-11-21 (×3): qty 2

## 2017-11-21 MED ORDER — HYDROMORPHONE HCL 2 MG PO TABS: 2.0000 mg | ORAL_TABLET | ORAL | Status: DC | PRN

## 2017-11-21 NOTE — Progress Notes (Signed)
Physical Therapy Treatment Patient Details Name: Laura Hahn MRN: [id number] DOB: February 22, 1951 Today's Date: 11/21/2017    History of Present Illness Patient is a 67 y/o female s/p L TKA (h/o R TKA)    PT Comments    Pt reports increased pain this morning after PT session and pain meds started wearing off.  Pt reports pain is now improved at rest. Pt agreeable to ambulate as tolerated and returned to bed, ice packs applied.  Follow Up Recommendations  Home health PT;Follow surgeon's recommendation for DC plan and follow-up therapies     Equipment Recommendations  Rolling walker with 5" wheels    Recommendations for Other Services       Precautions / Restrictions Precautions Precautions: Fall;Knee Precaution Comments: unable to locate KI in room, no knee buckling with mobility observed Restrictions LLE Weight Bearing: Weight bearing as tolerated    Mobility  Bed Mobility Overal bed mobility: Needs Assistance Bed Mobility: Supine to Sit;Sit to Supine     Supine to sit: Min assist Sit to supine: Min assist   General bed mobility comments: assist for L LE due to pain  Transfers Overall transfer level: Needs assistance Equipment used: Rolling walker (2 wheeled) Transfers: Sit to/from Stand Sit to Stand: Min guard         General transfer comment: verbal cues for UE and LE positioning  Ambulation/Gait Ambulation/Gait assistance: Min guard Ambulation Distance (Feet): 40 Feet Assistive device: Rolling walker (2 wheeled) Gait Pattern/deviations: Step-to pattern;Decreased stance time - left;Antalgic     General Gait Details: verbal cues for sequence, RW positioning, step length, encouraged more weight bearing through UEs due to knee pain, limited distance due to pain   Stairs            Wheelchair Mobility    Modified Rankin (Stroke Patients Only)       Balance                                            Cognition  Arousal/Alertness: Awake/alert Behavior During Therapy: WFL for tasks assessed/performed Overall Cognitive Status: Within Functional Limits for tasks assessed                                           General Comments        Pertinent Vitals/Pain Pain Assessment: 0-10 Pain Score: 8  Pain Location: L knee with weight bearing Pain Descriptors / Indicators: Aching;Sore Pain Intervention(s): Limited activity within patient's tolerance;Monitored during session;Repositioned;Ice applied(RN aware of pain)    Home Living                      Prior Function            PT Goals (current goals can now be found in the care plan section) Progress towards PT goals: Progressing toward goals    Frequency    7X/week      PT Plan Current plan remains appropriate    Co-evaluation              AM-PAC PT "6 Clicks" Daily Activity  Outcome Measure  Difficulty turning over in bed (including adjusting bedclothes, sheets and blankets)?: A Little Difficulty moving from lying on back to sitting on the side of the  bed? : Unable Difficulty sitting down on and standing up from a chair with arms (e.g., wheelchair, bedside commode, etc,.)?: Unable Help needed moving to and from a bed to chair (including a wheelchair)?: A Little Help needed walking in hospital room?: A Little Help needed climbing 3-5 steps with a railing? : A Little 6 Click Score: 14    End of Session Equipment Utilized During Treatment: Gait belt Activity Tolerance: Patient limited by fatigue Patient left: with call bell/phone within reach;in bed Nurse Communication: Other (comment)(RN and pt communicated about pain during ambulation) PT Visit Diagnosis: Difficulty in walking, not elsewhere classified (R26.2);Pain Pain - Right/Left: Left Pain - part of body: Knee     Time: 0321-2248 PT Time Calculation (min) (ACUTE ONLY): 16 min  Charges:  $Gait Training: 8-22 mins                    G  Codes:      Carmelia Bake, PT, DPT 11/21/2017 Pager: [phone]  York Ram E 11/21/2017, 1:48 PM

## 2017-11-21 NOTE — Progress Notes (Signed)
CSW consult-SNF  D/C Plan is for Home Health.   No SNF needs identified. CSW signing off.   Kathrin Greathouse, Latanya Presser, MSW Clinical Social Worker  [phone] 11/21/2017  9:06 AM

## 2017-11-21 NOTE — Progress Notes (Signed)
Physical Therapy Treatment Patient Details Name: Laura Hahn MRN: [id number] DOB: Jul 14, 1951 Today's Date: 11/21/2017    History of Present Illness Patient is a 67 y/o female s/p L TKA (h/o R TKA)    PT Comments    Pt ambulated in hallway and performed LE exercises.  Follow Up Recommendations  Home health PT;Follow surgeon's recommendation for DC plan and follow-up therapies     Equipment Recommendations  Rolling walker with 5" wheels    Recommendations for Other Services       Precautions / Restrictions Precautions Precautions: Fall;Knee Precaution Comments: unable to locate KI in room, no knee buckling with mobility observed Restrictions LLE Weight Bearing: Weight bearing as tolerated    Mobility  Bed Mobility Overal bed mobility: Needs Assistance Bed Mobility: Supine to Sit     Supine to sit: Min guard        Transfers Overall transfer level: Needs assistance Equipment used: Rolling walker (2 wheeled) Transfers: Sit to/from Stand Sit to Stand: Min guard         General transfer comment: verbal cues for UE and LE positioning  Ambulation/Gait Ambulation/Gait assistance: Min guard Ambulation Distance (Feet): 80 Feet Assistive device: Rolling walker (2 wheeled) Gait Pattern/deviations: Step-to pattern;Decreased stance time - left;Antalgic     General Gait Details: verbal cues for sequence, RW positioning, step length   Stairs            Wheelchair Mobility    Modified Rankin (Stroke Patients Only)       Balance                                            Cognition Arousal/Alertness: Awake/alert Behavior During Therapy: WFL for tasks assessed/performed Overall Cognitive Status: Within Functional Limits for tasks assessed                                        Exercises Total Joint Exercises Ankle Circles/Pumps: AROM;10 reps;Both;Supine Quad Sets: AROM;Left;Supine;10 reps Short Arc QuadSinclair Ship;Left;10 reps Heel Slides: Left;Supine;AAROM;10 reps Hip ABduction/ADduction: AROM;10 reps;Left Goniometric ROM: approx 85* AAROM L knee flexion    General Comments        Pertinent Vitals/Pain Pain Assessment: 0-10 Pain Score: 4  Pain Location: L knee Pain Descriptors / Indicators: Aching;Sore Pain Intervention(s): Limited activity within patient's tolerance;Repositioned;Monitored during session;Ice applied    Home Living                      Prior Function            PT Goals (current goals can now be found in the care plan section) Progress towards PT goals: Progressing toward goals    Frequency    7X/week      PT Plan Current plan remains appropriate    Co-evaluation              AM-PAC PT "6 Clicks" Daily Activity  Outcome Measure  Difficulty turning over in bed (including adjusting bedclothes, sheets and blankets)?: A Little Difficulty moving from lying on back to sitting on the side of the bed? : A Little Difficulty sitting down on and standing up from a chair with arms (e.g., wheelchair, bedside commode, etc,.)?: Unable Help needed moving to and from a bed to  chair (including a wheelchair)?: A Little Help needed walking in hospital room?: A Little Help needed climbing 3-5 steps with a railing? : A Little 6 Click Score: 16    End of Session Equipment Utilized During Treatment: Gait belt Activity Tolerance: Patient limited by fatigue Patient left: in chair;with call bell/phone within reach   PT Visit Diagnosis: Difficulty in walking, not elsewhere classified (R26.2);Pain Pain - Right/Left: Left Pain - part of body: Knee     Time: 0951-1010 PT Time Calculation (min) (ACUTE ONLY): 19 min  Charges:  $Therapeutic Exercise: 8-22 mins                    G Codes:       Carmelia Bake, PT, DPT 11/21/2017 Pager: [phone]  York Ram E 11/21/2017, 12:49 PM

## 2017-11-22 LAB — CBC
HEMATOCRIT: 36.6 % (ref 36.0–46.0)
Hemoglobin: 12.3 g/dL (ref 12.0–15.0)
MCH: 34.2 pg — AB (ref 26.0–34.0)
MCHC: 33.6 g/dL (ref 30.0–36.0)
MCV: 101.7 fL — AB (ref 78.0–100.0)
PLATELETS: 231 10*3/uL (ref 150–400)
RBC: 3.6 MIL/uL — AB (ref 3.87–5.11)
RDW: 12.3 % (ref 11.5–15.5)
WBC: 9 10*3/uL (ref 4.0–10.5)

## 2017-11-22 MED ORDER — HYDROMORPHONE HCL 2 MG PO TABS: 2.0000 mg | ORAL_TABLET | ORAL | 0 refills | Status: DC | PRN

## 2017-11-22 MED ORDER — TRIAMTERENE-HCTZ 37.5-25 MG PO TABS
1.0000 | ORAL_TABLET | Freq: Every day | ORAL | Status: DC
  Administered 2017-11-22 – 2017-11-23 (×2): 1 via ORAL
  Filled 2017-11-22 (×2): qty 1

## 2017-11-22 NOTE — Progress Notes (Signed)
Discharge planning, spoke with patient and spouse at bedside. Have chosen [facility] for HH PT, evaluate and treat. Contacted [facility] for referral. Needs RW and 3n1, contacted [facility] to deliver to room. [phone] 

## 2017-11-22 NOTE — Progress Notes (Signed)
Physical Therapy Treatment Patient Details Name: Laura Hahn MRN: [id number] DOB: 02/11/1951 Today's Date: 11/22/2017    History of Present Illness Patient is a 67 y/o female s/p L TKA (h/o R TKA)    PT Comments    Pt performed LE exercises with assist of leg lifter, avoided knee flexion per conversation with pt this morning.  Pt ambulated in hallway, distance to tolerance.  Follow Up Recommendations  Home health PT;Follow surgeon's recommendation for DC plan and follow-up therapies     Equipment Recommendations  Rolling walker with 5" wheels    Recommendations for Other Services       Precautions / Restrictions Precautions Precautions: Fall;Knee Restrictions LLE Weight Bearing: Weight bearing as tolerated    Mobility  Bed Mobility Overal bed mobility: Needs Assistance Bed Mobility: Sit to Supine;Supine to Sit     Supine to sit: Min guard Sit to supine: Min guard   General bed mobility comments: spouse just arrived to room with leg lifter so practiced bed mobility with leg lifter  Transfers Overall transfer level: Needs assistance Equipment used: Rolling walker (2 wheeled) Transfers: Sit to/from Stand Sit to Stand: Min guard         General transfer comment: verbal cues for UE and LE positioning  Ambulation/Gait Ambulation/Gait assistance: Min guard Ambulation Distance (Feet): 120 Feet Assistive device: Rolling walker (2 wheeled) Gait Pattern/deviations: Step-to pattern;Decreased stance time - left;Antalgic     General Gait Details: verbal cues for sequence, RW positioning, step length, distance to pt tolerance   Stairs            Wheelchair Mobility    Modified Rankin (Stroke Patients Only)       Balance                                            Cognition Arousal/Alertness: Awake/alert Behavior During Therapy: WFL for tasks assessed/performed Overall Cognitive Status: Within Functional Limits for tasks assessed                                         Exercises Total Joint Exercises Ankle Circles/Pumps: AROM;10 reps;Both;Supine Quad Sets: AROM;Left;Supine;10 reps Short Arc QuadSinclair Ship;Left;10 reps;Supine Hip ABduction/ADduction: 10 reps;Left;AAROM;Supine Straight Leg Raises: AAROM;10 reps;Left;Supine    General Comments        Pertinent Vitals/Pain Pain Assessment: 0-10 Pain Score: 5  Pain Location: L knee  Pain Descriptors / Indicators: Aching;Sore Pain Intervention(s): Limited activity within patient's tolerance;Ice applied;Monitored during session;Patient requesting pain meds-RN notified    Home Living                      Prior Function            PT Goals (current goals can now be found in the care plan section) Progress towards PT goals: Progressing toward goals    Frequency    7X/week      PT Plan Current plan remains appropriate    Co-evaluation              AM-PAC PT "6 Clicks" Daily Activity  Outcome Measure  Difficulty turning over in bed (including adjusting bedclothes, sheets and blankets)?: A Little Difficulty moving from lying on back to sitting on the side of the bed? : Unable  Difficulty sitting down on and standing up from a chair with arms (e.g., wheelchair, bedside commode, etc,.)?: Unable Help needed moving to and from a bed to chair (including a wheelchair)?: A Little Help needed walking in hospital room?: A Little Help needed climbing 3-5 steps with a railing? : A Little 6 Click Score: 14    End of Session Equipment Utilized During Treatment: Gait belt Activity Tolerance: Patient tolerated treatment well Patient left: with call bell/phone within reach;in bed;with family/visitor present Nurse Communication: Patient requests pain meds PT Visit Diagnosis: Difficulty in walking, not elsewhere classified (R26.2);Pain Pain - Right/Left: Left Pain - part of body: Knee     Time: 4037-0964 PT Time Calculation (min)  (ACUTE ONLY): 17 min  Charges:   $Therapeutic Exercise: 8-22 mins                    G Codes:       Carmelia Bake, PT, DPT 11/22/2017 Pager: [phone]  York Ram E 11/22/2017, 2:33 PM

## 2017-11-22 NOTE — Progress Notes (Signed)
Physical Therapy Treatment Patient Details Name: Laura Hahn MRN: [id number] DOB: April 18, 1951 Today's Date: 11/22/2017    History of Present Illness Patient is a 67 y/o female s/p L TKA (h/o R TKA)    PT Comments    Pt reports difficulty with pain yesterday and change in pain meds.  Pt reports Dr. Tonita Cong told her not to perform knee flexion today as she "over did it" yesterday with exercises.  Pt with slow, antalgic gait, however able to tolerate improved distance this morning.  Pt states plan for d/c tomorrow.   Follow Up Recommendations  Home health PT;Follow surgeon's recommendation for DC plan and follow-up therapies     Equipment Recommendations  Rolling walker with 5" wheels    Recommendations for Other Services       Precautions / Restrictions Precautions Precautions: Fall;Knee Restrictions LLE Weight Bearing: Weight bearing as tolerated    Mobility  Bed Mobility   Bed Mobility: Sit to Supine       Sit to supine: Min assist   General bed mobility comments: assist for L LE due to pain  Transfers Overall transfer level: Needs assistance Equipment used: Rolling walker (2 wheeled) Transfers: Sit to/from Stand Sit to Stand: Min guard         General transfer comment: verbal cues for UE and LE positioning  Ambulation/Gait Ambulation/Gait assistance: Min guard Ambulation Distance (Feet): 120 Feet Assistive device: Rolling walker (2 wheeled) Gait Pattern/deviations: Step-to pattern;Decreased stance time - left;Antalgic     General Gait Details: verbal cues for sequence, RW positioning, step length, distance to pt tolerance   Stairs            Wheelchair Mobility    Modified Rankin (Stroke Patients Only)       Balance                                            Cognition Arousal/Alertness: Awake/alert Behavior During Therapy: WFL for tasks assessed/performed Overall Cognitive Status: Within Functional Limits for tasks  assessed                                        Exercises      General Comments        Pertinent Vitals/Pain Pain Assessment: 0-10 Pain Score: 5  Pain Location: L knee  Pain Descriptors / Indicators: Aching;Sore Pain Intervention(s): Limited activity within patient's tolerance;Repositioned;Premedicated before session;Monitored during session    Home Living                      Prior Function            PT Goals (current goals can now be found in the care plan section) Progress towards PT goals: Progressing toward goals    Frequency    7X/week      PT Plan Current plan remains appropriate    Co-evaluation              AM-PAC PT "6 Clicks" Daily Activity  Outcome Measure  Difficulty turning over in bed (including adjusting bedclothes, sheets and blankets)?: A Little Difficulty moving from lying on back to sitting on the side of the bed? : Unable Difficulty sitting down on and standing up from a chair with arms (e.g., wheelchair,  bedside commode, etc,.)?: Unable Help needed moving to and from a bed to chair (including a wheelchair)?: A Little Help needed walking in hospital room?: A Little Help needed climbing 3-5 steps with a railing? : A Lot 6 Click Score: 13    End of Session Equipment Utilized During Treatment: Gait belt Activity Tolerance: Patient tolerated treatment well Patient left: with call bell/phone within reach;in bed   PT Visit Diagnosis: Difficulty in walking, not elsewhere classified (R26.2);Pain Pain - Right/Left: Left Pain - part of body: Knee     Time: 6045-4098 PT Time Calculation (min) (ACUTE ONLY): 16 min  Charges:  $Gait Training: 8-22 mins                    G Codes:      Carmelia Bake, PT, DPT 11/22/2017 Pager: [phone]  York Ram E 11/22/2017, 1:09 PM

## 2017-11-22 NOTE — Progress Notes (Signed)
Patient ID: Laura Hahn, female   DOB: Nov 07, 1950, 67 y.o.   MRN: [id number] Subjective: 2 Days Post-Op Procedure(s) (LRB): TOTAL LEFT KNEE ARTHROPLASTY (Left) Patient reports pain as mild and moderate.    Patient has complaints of L knee and thigh pain. Pain is much better with switch from Oxy to Dilaudid PO. No BM yet, no distension or c/o abdominal pain. Otherwise doing well.  Objective: Vital signs in last 24 hours: Temp:  [98.1 F (36.7 C)-98.7 F (37.1 C)] 98.1 F (36.7 C) (03/08 0540) Pulse Rate:  [86-93] 91 (03/08 0540) Resp:  [18-20] 18 (03/08 0540) BP: (108-137)/(74-88) 132/88 (03/08 0540) SpO2:  [97 %-99 %] 98 % (03/08 0540)  Intake/Output from previous day:  Intake/Output Summary (Last 24 hours) at 11/22/2017 0826 Last data filed at 11/22/2017 2426 Gross per 24 hour  Intake 2090 ml  Output 2650 ml  Net -560 ml    Intake/Output this shift: Total I/O In: 240 [P.O.:240] Out: -   Labs: Results for orders placed or performed during the hospital encounter of 11/20/17  CBC  Result Value Ref Range   WBC 9.3 4.0 - 10.5 K/uL   RBC 3.61 (L) 3.87 - 5.11 MIL/uL   Hemoglobin 12.2 12.0 - 15.0 g/dL   HCT 36.0 36.0 - 46.0 %   MCV 99.7 78.0 - 100.0 fL   MCH 33.8 26.0 - 34.0 pg   MCHC 33.9 30.0 - 36.0 g/dL   RDW 12.0 11.5 - 15.5 %   Platelets 222 150 - 400 K/uL  Basic metabolic panel  Result Value Ref Range   Sodium 131 (L) 135 - 145 mmol/L   Potassium 4.1 3.5 - 5.1 mmol/L   Chloride 97 (L) 101 - 111 mmol/L   CO2 27 22 - 32 mmol/L   Glucose, Bld 128 (H) 65 - 99 mg/dL   BUN 11 6 - 20 mg/dL   Creatinine, Ser 0.79 0.44 - 1.00 mg/dL   Calcium 9.0 8.9 - 10.3 mg/dL   GFR calc non Af Amer >60 >60 mL/min   GFR calc Af Amer >60 >60 mL/min   Anion gap 7 5 - 15  CBC  Result Value Ref Range   WBC 9.0 4.0 - 10.5 K/uL   RBC 3.60 (L) 3.87 - 5.11 MIL/uL   Hemoglobin 12.3 12.0 - 15.0 g/dL   HCT 36.6 36.0 - 46.0 %   MCV 101.7 (H) 78.0 - 100.0 fL   MCH 34.2 (H) 26.0 - 34.0 pg   MCHC 33.6 30.0 - 36.0 g/dL   RDW 12.3 11.5 - 15.5 %   Platelets 231 150 - 400 K/uL    Exam - Neurologically intact ABD soft Neurovascular intact Sensation intact distally Intact pulses distally Dorsiflexion/Plantar flexion intact Incision: dressing C/D/I and no drainage No cellulitis present Compartment soft no calf pain or sign of DVT Dressing/Incision - clean, dry, no drainage Motor function intact - moving foot and toes well on exam.   Assessment/Plan: 2 Days Post-Op Procedure(s) (LRB): TOTAL LEFT KNEE ARTHROPLASTY (Left)  Advance diet Up with therapy D/C IV fluids Past Medical History:  Diagnosis Date  . Arthritis    neck  . Diverticulosis   . History of kidney infection   . Hypertension    states under control with meds., has been on med. x 30 yr.  . Mass of finger of left hand 06/2015   small finger  . Osteoarthritis   . PONV (postoperative nausea and vomiting)     DVT Prophylaxis -  ASA 325mg  BID Protocol Weight-Bearing as tolerated to L leg Plan D/C tomorrow after morning PT, home with HHPT Will discuss with Dr. Mliss Fritz, [place]. 11/22/2017, 8:26 AM

## 2017-11-23 LAB — CBC
HCT: 33 % — ABNORMAL LOW (ref 36.0–46.0)
HEMOGLOBIN: 11.3 g/dL — AB (ref 12.0–15.0)
MCH: 34 pg (ref 26.0–34.0)
MCHC: 34.2 g/dL (ref 30.0–36.0)
MCV: 99.4 fL (ref 78.0–100.0)
PLATELETS: 221 10*3/uL (ref 150–400)
RBC: 3.32 MIL/uL — ABNORMAL LOW (ref 3.87–5.11)
RDW: 12.1 % (ref 11.5–15.5)
WBC: 8.1 10*3/uL (ref 4.0–10.5)

## 2017-11-23 NOTE — Progress Notes (Signed)
Physical Therapy Treatment Patient Details Name: Laura Hahn MRN: [id number] DOB: [birth year] Today's Date: 11/23/2017    History of Present Illness Patient is a 67 y/o female s/p L TKA (h/o R TKA)    PT Comments    Pt is making steady progress with mobility. Pt was able to walk in the hall with RW mod. Indep. Pt resumed L LE knee flexion 0-90*. Pt given a HEP handout and educated pt and spouse to complete exercises at home. Pt has a level entry and does not need step training at this time. Did verbally discuss technique and HHPT can follow-up for community ambulation. Pt is cleared to d/c home today. Pt agreeable to ambulate in house and continue exercises with assistance from her spouse. HHPT to continue therapy.  Follow Up Recommendations  Follow surgeon's recommendation for DC plan and follow-up therapies;Home health PT     Equipment Recommendations  Rolling walker with 5" wheels    Recommendations for Other Services       Precautions / Restrictions Precautions Precautions: Knee Restrictions Weight Bearing Restrictions: No LLE Weight Bearing: Weight bearing as tolerated    Mobility  Bed Mobility Overal bed mobility: Modified Independent Bed Mobility: Supine to Sit     Supine to sit: Modified independent (Device/Increase time)     General bed mobility comments: Requested leg lifter to assist LLE out of bed  Transfers Overall transfer level: Modified independent Equipment used: Rolling walker (2 wheeled) Transfers: Sit to/from Stand Sit to Stand: Modified independent (Device/Increase time) Stand pivot transfers: Modified independent (Device/Increase time)          Ambulation/Gait Ambulation/Gait assistance: Modified independent (Device/Increase time) Ambulation Distance (Feet): 175 Feet Assistive device: Rolling walker (2 wheeled) Gait Pattern/deviations: Step-through pattern;Decreased stance time - left   Gait velocity interpretation: Below normal speed  for age/gender     Stairs Stairs: (verbally reviewed technique)          Wheelchair Mobility    Modified Rankin (Stroke Patients Only)       Balance Overall balance assessment: Needs assistance Sitting-balance support: No upper extremity supported;Feet supported Sitting balance-Leahy Scale: Normal     Standing balance support: Bilateral upper extremity supported Standing balance-Leahy Scale: Good                              Cognition Arousal/Alertness: Awake/alert Behavior During Therapy: WFL for tasks assessed/performed Overall Cognitive Status: Within Functional Limits for tasks assessed                                        Exercises Total Joint Exercises Ankle Circles/Pumps: AROM;Strengthening;Both;15 reps;Supine Quad Sets: AROM;Strengthening;Both;10 reps;Supine Hip ABduction/ADduction: AAROM;Strengthening;Left;Supine Straight Leg Raises: AAROM;Strengthening;Left;10 reps;Supine Long Arc Quad: AAROM;Strengthening;Left;Seated;10 reps Knee Flexion: AAROM;Strengthening;Left;10 reps;Seated Goniometric ROM: 0-90*    General Comments General comments (skin integrity, edema, etc.): Increased edema in L knee      Pertinent Vitals/Pain Pain Assessment: 0-10 Pain Score: 2  Pain Location: L knee  Pain Descriptors / Indicators: Discomfort Pain Intervention(s): Limited activity within patient's tolerance;Monitored during session;Ice applied    Home Living                      Prior Function            PT Goals (current goals can now be found in the care  plan section) Progress towards PT goals: Progressing toward goals    Frequency    7X/week      PT Plan Current plan remains appropriate    Co-evaluation              AM-PAC PT "6 Clicks" Daily Activity  Outcome Measure  Difficulty turning over in bed (including adjusting bedclothes, sheets and blankets)?: A Little Difficulty moving from lying on back to  sitting on the side of the bed? : A Little Difficulty sitting down on and standing up from a chair with arms (e.g., wheelchair, bedside commode, etc,.)?: A Little Help needed moving to and from a bed to chair (including a wheelchair)?: A Little Help needed walking in hospital room?: A Little Help needed climbing 3-5 steps with a railing? : A Little 6 Click Score: 18    End of Session   Activity Tolerance: Patient tolerated treatment well Patient left: in chair;with call bell/phone within reach;with family/visitor present Nurse Communication: Mobility status PT Visit Diagnosis: Difficulty in walking, not elsewhere classified (R26.2) Pain - Right/Left: Left Pain - part of body: Knee     Time: 5300-5110 PT Time Calculation (min) (ACUTE ONLY): 39 min  Charges:  $Gait Training: 23-37 mins $Therapeutic Exercise: 8-22 mins                    G Codes:       Theodoro Grist, PT   Lelon Mast 11/23/2017, 11:14 AM

## 2017-11-23 NOTE — Discharge Summary (Signed)
Physician Discharge Summary  Patient ID: Laura Hahn MRN: [id number] DOB/AGE: 65-Dec-1952 65 y.o.  Admit date: 11/20/2017 Discharge date:  11/23/17  Procedures:  Procedure(s) (LRB): TOTAL LEFT KNEE ARTHROPLASTY (Left)  Attending Physician:  Dr. Tonita Cong  Admission Diagnoses:  Left knee end stage osteoarthritis  Discharge Diagnoses:  Principal Problem:   Primary osteoarthritis of left knee Active Problems:   Left knee DJD  Past Medical History:  Diagnosis Date  . Arthritis    neck  . Diverticulosis   . History of kidney infection   . Hypertension    states under control with meds., has been on med. x 30 yr.  . Mass of finger of left hand 06/2015   small finger  . Osteoarthritis   . PONV (postoperative nausea and vomiting)       PCP: Greig Right, MD   Discharged Condition: good  Hospital Course:  Patient underwent the above stated procedure on 11/20/2017. Patient tolerated the procedure well and brought to the recovery room in good condition and subsequently to the floor.   Disposition: 01-Home or Self Care with follow up in 2 weeks   Follow-up Information    Susa Day, MD Follow up in 2 week(s).   Specialty:  Orthopedic Surgery Contact information: [address] [place] [address] [phone]        [facility], [facility] Follow up.   Specialty:  Home Health Services Why:  physical therapy Contact information: [address] [place] [address] [name].   Why:  walker and [facility] Contact information: [address] [place]  [address] [phone]           Discharge Instructions    Call MD / Call 911   Complete by:  As directed    If you experience chest pain or shortness of breath, CALL 911 and be transported to the hospital emergency room.  If you develope a fever above 101 F, pus (white drainage) or increased drainage or redness at the wound, or calf  pain, call your surgeon's office.   Constipation Prevention   Complete by:  As directed    Drink plenty of fluids.  Prune juice may be helpful.  You may use a stool softener, such as Colace (over the counter) 100 mg twice a day.  Use MiraLax (over the counter) for constipation as needed.   Diet - low sodium heart healthy   Complete by:  As directed    Increase activity slowly as tolerated   Complete by:  As directed       Allergies as of 11/23/2017      Reactions   Lisinopril Cough   Metoprolol Rash      Medication List    STOP taking these medications   cyclobenzaprine 10 MG tablet Commonly known as:  FLEXERIL   naproxen sodium 220 MG tablet Commonly known as:  ALEVE     TAKE these medications   ACIDOPHILUS PO Take 1 capsule by mouth daily.   aspirin EC 325 MG tablet Take 1 tablet (325 mg total) by mouth 2 (two) times daily. What changed:    medication strength  how much to take  when to take this   docusate sodium 100 MG capsule Commonly known as:  COLACE Take 1 capsule (100 mg total) by mouth 2 (two) times daily as needed for mild constipation. What changed:  medication strength  how much to take  when to take this  reasons to take this   estrogens-methylTEST 1.25-2.5 MG tablet Commonly known as:  ESTRATEST Take 1 tablet by mouth every other day.   HYDROmorphone 2 MG tablet Commonly known as:  DILAUDID Take 1-2 tablets (2-4 mg total) by mouth every 4 (four) hours as needed for severe pain.   irbesartan 300 MG tablet Commonly known as:  AVAPRO Take 300 mg by mouth daily.   methocarbamol 500 MG tablet Commonly known as:  ROBAXIN Take 1 tablet (500 mg total) by mouth every 6 (six) hours as needed for muscle spasms.   multivitamin with minerals Tabs tablet Take 1 tablet by mouth daily.   polyethylene glycol packet Commonly known as:  MIRALAX / GLYCOLAX Take 17 g by mouth daily.   triamterene-hydrochlorothiazide 75-50 MG tablet Commonly  known as:  MAXZIDE Take 0.5 tablets by mouth daily.            Durable Medical Equipment  (From admission, onward)        Start     Ordered   11/21/17 1503  For home use only DME Walker rolling  Once    Question:  Patient needs a walker to treat with the following condition  Answer:  S/P knee surgery   11/21/17 1502   11/21/17 1503  For home use only DME 3 n 1  Once     11/21/17 1502       Brad Luna [place], [place] is now [facility] [address] [place], [place], [place] [address] Phone: [phone] [email or web address] Facebook  [employer]

## 2017-11-23 NOTE — Progress Notes (Signed)
   Subjective: 3 Days Post-Op Procedure(s) (LRB): TOTAL LEFT KNEE ARTHROPLASTY (Left)  Pt had a rough night in regards to paint last night Pain has improved this morning  Would like to reserve decision to d/c until later today after therapy Still c/o left leg spasms down sciatic nerve going all the way to her foot Minimal pain in the knee itself Patient reports pain as moderate.  Objective:   VITALS:   Vitals:   11/22/17 2131 11/23/17 0551  BP: 132/78 127/87  Pulse: (!) 109   Resp: 17 16  Temp: 98.7 F (37.1 C) 98.3 F (36.8 C)  SpO2: 97% 98%   No lumbar pain with adequate rom Left knee well healing incision with dressing in place nv intact distally No rashes or edema distally  LABS Recent Labs    11/21/17 0544 11/22/17 0554 11/23/17 0542  HGB 12.2 12.3 11.3*  HCT 36.0 36.6 33.0*  WBC 9.3 9.0 8.1  PLT 222 231 221    Recent Labs    11/21/17 0544  NA 131*  K 4.1  BUN 11  CREATININE 0.79  GLUCOSE 128*     Assessment/Plan: 3 Days Post-Op Procedure(s) (LRB): TOTAL LEFT KNEE ARTHROPLASTY (Left) Probable d/c later today after therapy Please call me for d/c order when pt ready Continue PT/OT Pain management as needed Pulmonary toilet    Brad Luna [place], [place] is now [facility]  [place] [address] [place], [place], [place] [address] Phone: [phone] [email or web address] Facebook  [employer]

## 2017-11-26 DIAGNOSIS — Z96652 Presence of left artificial knee joint: Secondary | ICD-10-CM | POA: Insufficient documentation

## 2018-02-25 DIAGNOSIS — M19049 Primary osteoarthritis, unspecified hand: Secondary | ICD-10-CM | POA: Insufficient documentation

## 2018-04-08 DIAGNOSIS — L578 Other skin changes due to chronic exposure to nonionizing radiation: Secondary | ICD-10-CM | POA: Diagnosis not present

## 2018-04-08 DIAGNOSIS — L570 Actinic keratosis: Secondary | ICD-10-CM | POA: Diagnosis not present

## 2018-04-08 DIAGNOSIS — L821 Other seborrheic keratosis: Secondary | ICD-10-CM | POA: Diagnosis not present

## 2018-04-16 ENCOUNTER — Encounter: Payer: Self-pay | Admitting: Gastroenterology

## 2018-04-21 ENCOUNTER — Encounter: Payer: Self-pay | Admitting: Gastroenterology

## 2018-05-20 DIAGNOSIS — M1712 Unilateral primary osteoarthritis, left knee: Secondary | ICD-10-CM | POA: Diagnosis not present

## 2018-05-22 ENCOUNTER — Telehealth: Payer: Self-pay | Admitting: *Deleted

## 2018-05-22 NOTE — Telephone Encounter (Signed)
Dr. Lyndel Safe,  This patient is scheduled for colonoscopy 06/16/18. She has a family history of colon cancer and has had surgery for diverticulosis 03/2017. Last colons 08/2008,06/2010,05/2012, 08/2015. She has never had any polyps. Should timing for a repeat colonoscopy be now or 08/2020?  Please advise. Thank you, Olivia Mackie

## 2018-05-22 NOTE — Telephone Encounter (Signed)
Should be 08/2020 unless she is having any problems. Thx Merrie Roof

## 2018-05-22 NOTE — Telephone Encounter (Signed)
Called patient and informed her of recommendations for repeat colonoscopy. Pt understood. Entered recall colon for Dr. Lyndel Safe for 08/2020.

## 2018-05-26 DIAGNOSIS — R2230 Localized swelling, mass and lump, unspecified upper limb: Secondary | ICD-10-CM | POA: Insufficient documentation

## 2018-05-26 DIAGNOSIS — R2232 Localized swelling, mass and lump, left upper limb: Secondary | ICD-10-CM | POA: Diagnosis not present

## 2018-05-26 DIAGNOSIS — M19042 Primary osteoarthritis, left hand: Secondary | ICD-10-CM | POA: Diagnosis not present

## 2018-06-06 DIAGNOSIS — Z6828 Body mass index (BMI) 28.0-28.9, adult: Secondary | ICD-10-CM | POA: Diagnosis not present

## 2018-06-06 DIAGNOSIS — E663 Overweight: Secondary | ICD-10-CM | POA: Diagnosis not present

## 2018-06-06 DIAGNOSIS — M170 Bilateral primary osteoarthritis of knee: Secondary | ICD-10-CM | POA: Diagnosis not present

## 2018-06-06 DIAGNOSIS — E7800 Pure hypercholesterolemia, unspecified: Secondary | ICD-10-CM | POA: Diagnosis not present

## 2018-06-06 DIAGNOSIS — M150 Primary generalized (osteo)arthritis: Secondary | ICD-10-CM | POA: Diagnosis not present

## 2018-06-06 DIAGNOSIS — F411 Generalized anxiety disorder: Secondary | ICD-10-CM | POA: Diagnosis not present

## 2018-06-06 DIAGNOSIS — I10 Essential (primary) hypertension: Secondary | ICD-10-CM | POA: Diagnosis not present

## 2018-06-06 DIAGNOSIS — R42 Dizziness and giddiness: Secondary | ICD-10-CM | POA: Diagnosis not present

## 2018-06-16 ENCOUNTER — Encounter: Payer: BLUE CROSS/BLUE SHIELD | Admitting: Gastroenterology

## 2018-06-17 DIAGNOSIS — Z23 Encounter for immunization: Secondary | ICD-10-CM | POA: Diagnosis not present

## 2018-06-23 DIAGNOSIS — R2232 Localized swelling, mass and lump, left upper limb: Secondary | ICD-10-CM | POA: Diagnosis not present

## 2018-06-23 DIAGNOSIS — M67442 Ganglion, left hand: Secondary | ICD-10-CM | POA: Diagnosis not present

## 2018-07-03 DIAGNOSIS — R2232 Localized swelling, mass and lump, left upper limb: Secondary | ICD-10-CM | POA: Diagnosis not present

## 2018-07-03 DIAGNOSIS — Z5189 Encounter for other specified aftercare: Secondary | ICD-10-CM | POA: Diagnosis not present

## 2018-08-01 DIAGNOSIS — J209 Acute bronchitis, unspecified: Secondary | ICD-10-CM | POA: Diagnosis not present

## 2018-10-09 DIAGNOSIS — Z23 Encounter for immunization: Secondary | ICD-10-CM | POA: Diagnosis not present

## 2018-10-09 DIAGNOSIS — I10 Essential (primary) hypertension: Secondary | ICD-10-CM | POA: Diagnosis not present

## 2018-10-09 DIAGNOSIS — F411 Generalized anxiety disorder: Secondary | ICD-10-CM | POA: Diagnosis not present

## 2018-10-09 DIAGNOSIS — E7800 Pure hypercholesterolemia, unspecified: Secondary | ICD-10-CM | POA: Diagnosis not present

## 2018-10-09 DIAGNOSIS — E669 Obesity, unspecified: Secondary | ICD-10-CM | POA: Diagnosis not present

## 2018-10-09 DIAGNOSIS — Z6830 Body mass index (BMI) 30.0-30.9, adult: Secondary | ICD-10-CM | POA: Diagnosis not present

## 2018-10-20 DIAGNOSIS — K5792 Diverticulitis of intestine, part unspecified, without perforation or abscess without bleeding: Secondary | ICD-10-CM | POA: Diagnosis not present

## 2018-11-07 DIAGNOSIS — D225 Melanocytic nevi of trunk: Secondary | ICD-10-CM | POA: Diagnosis not present

## 2018-11-07 DIAGNOSIS — D1801 Hemangioma of skin and subcutaneous tissue: Secondary | ICD-10-CM | POA: Diagnosis not present

## 2018-11-07 DIAGNOSIS — L814 Other melanin hyperpigmentation: Secondary | ICD-10-CM | POA: Diagnosis not present

## 2018-11-07 DIAGNOSIS — L821 Other seborrheic keratosis: Secondary | ICD-10-CM | POA: Diagnosis not present

## 2018-11-07 DIAGNOSIS — C44719 Basal cell carcinoma of skin of left lower limb, including hip: Secondary | ICD-10-CM | POA: Diagnosis not present

## 2018-11-07 DIAGNOSIS — L570 Actinic keratosis: Secondary | ICD-10-CM | POA: Diagnosis not present

## 2018-11-14 DIAGNOSIS — H524 Presbyopia: Secondary | ICD-10-CM | POA: Diagnosis not present

## 2018-11-14 DIAGNOSIS — H04123 Dry eye syndrome of bilateral lacrimal glands: Secondary | ICD-10-CM | POA: Diagnosis not present

## 2018-12-04 IMAGING — DX DG KNEE 1-2V PORT*L*
2 series · 2 of 2 positions shown · non-contrast
Comparison: Portable exam 0083 hours without priors for comparison

CLINICAL DATA: Post LEFT knee arthroplasty

EXAM:
PORTABLE LEFT KNEE - 1-2 VIEW

[knee ap]
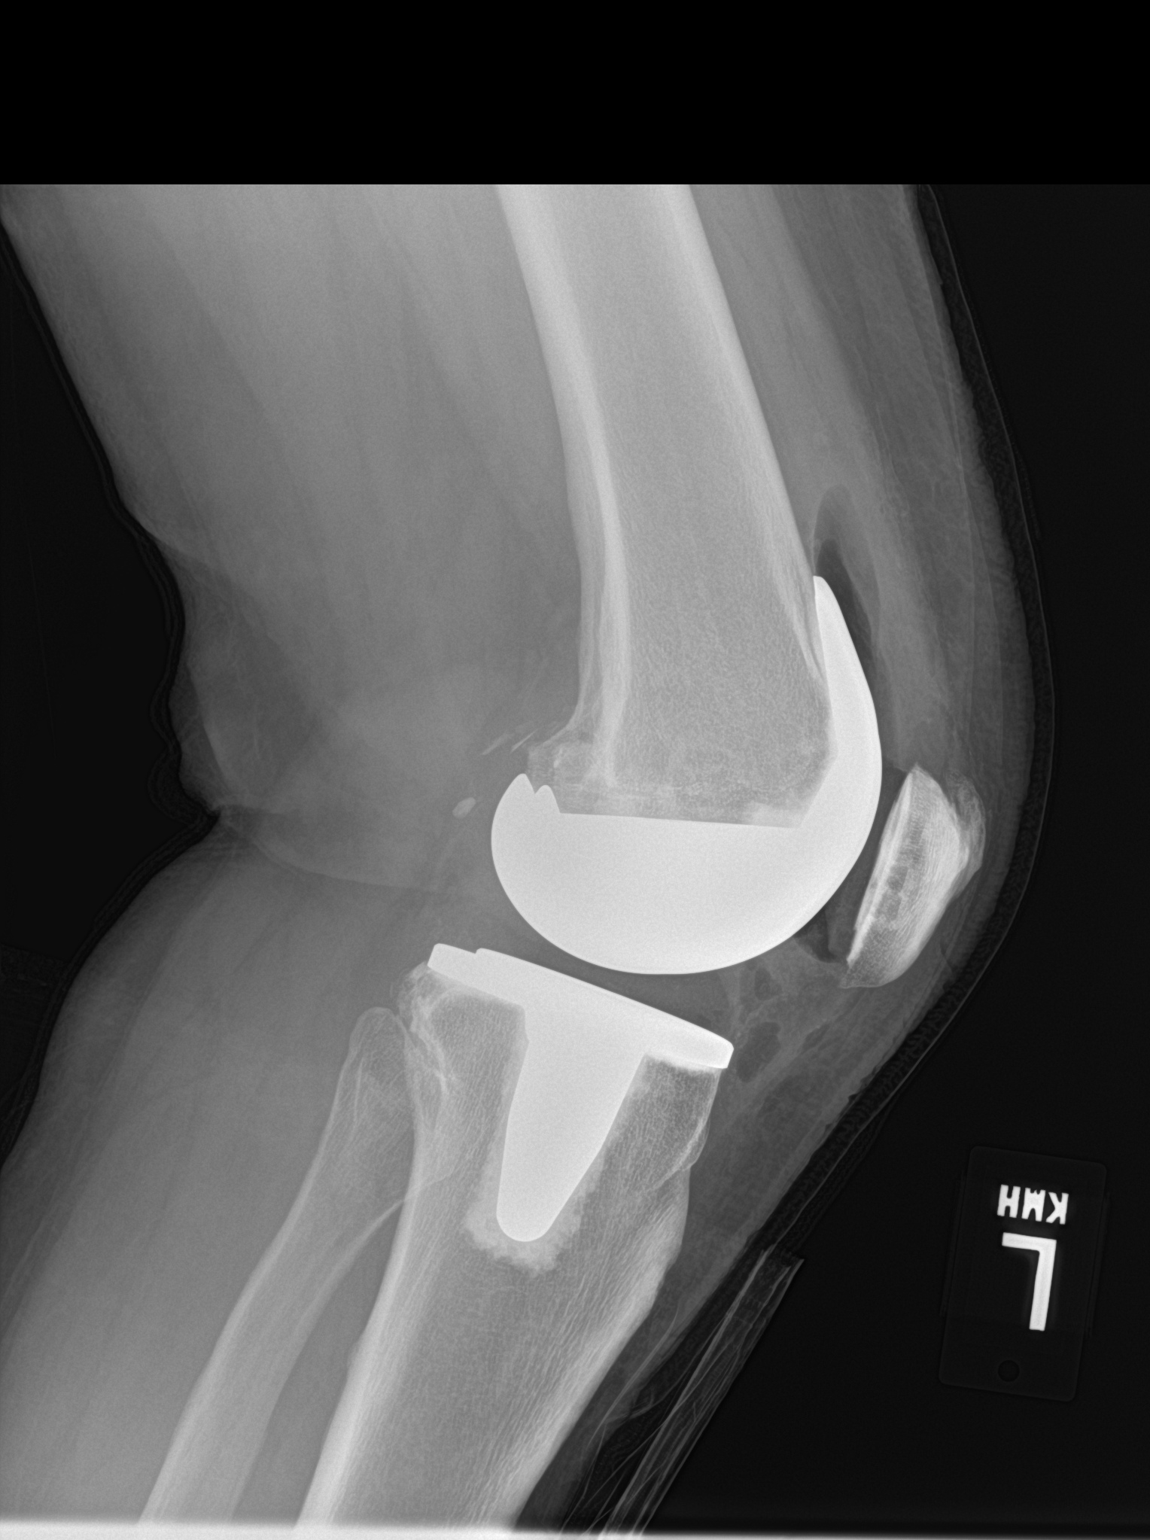

[knee lat]
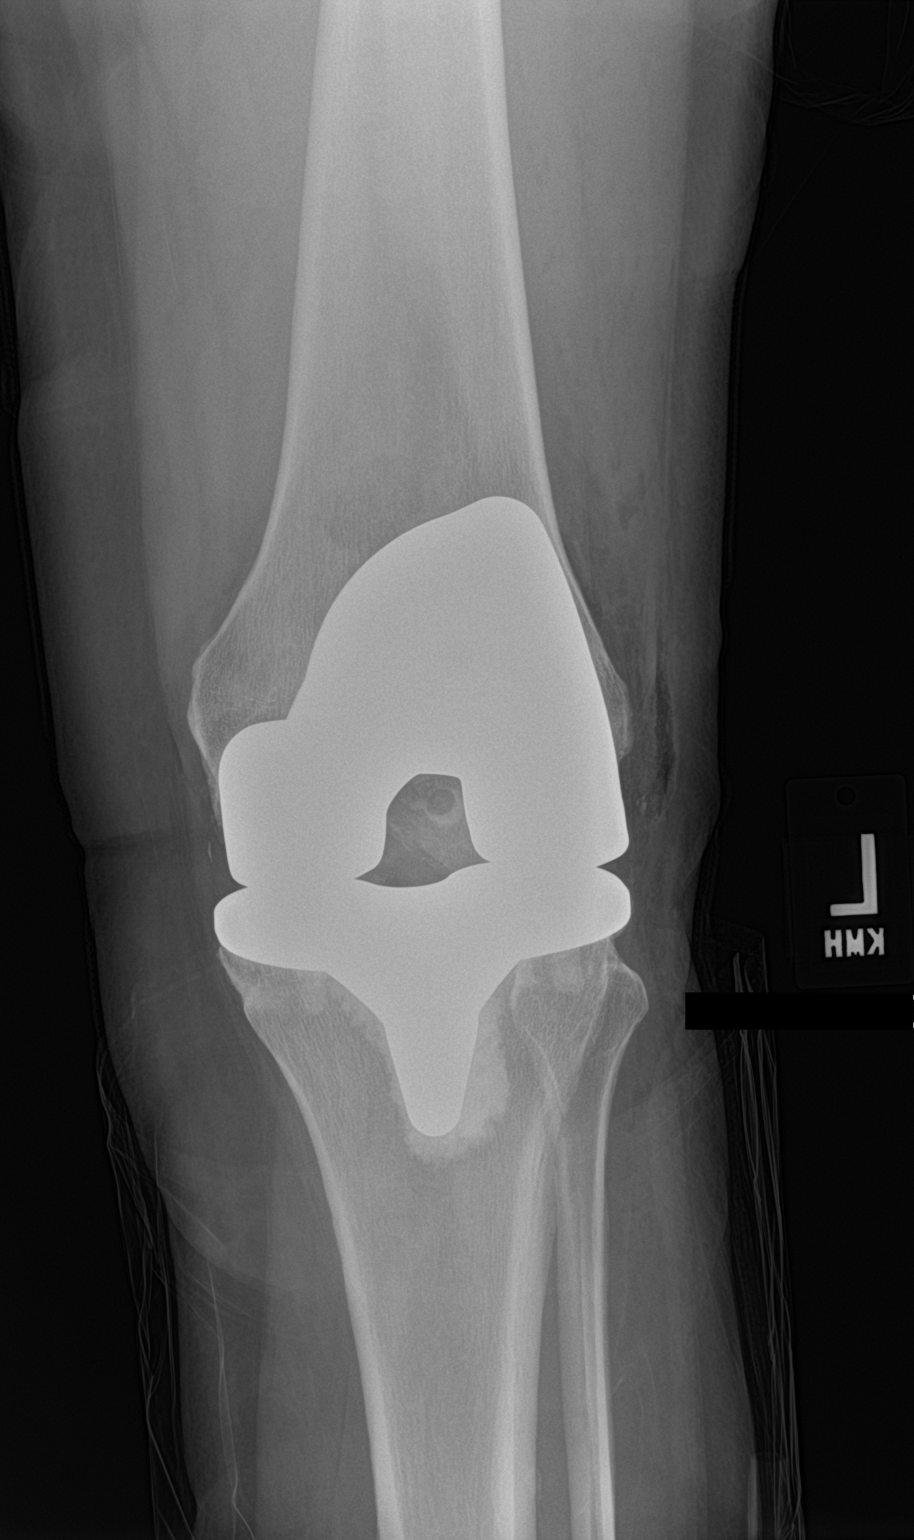

[2 of 2 positions shown; findings below may reference images not displayed]

FINDINGS: Components of a LEFT knee prosthesis are identified in expected
positions.

No acute fracture, dislocation, or periprosthetic lucency.

Expected postsurgical changes of the soft tissues at the anterior
knee.

Osseous mineralization normal.
IMPRESSION: LEFT knee prosthesis without acute complication.

## 2018-12-23 DIAGNOSIS — J0100 Acute maxillary sinusitis, unspecified: Secondary | ICD-10-CM | POA: Diagnosis not present

## 2019-02-05 DIAGNOSIS — Z0000 Encounter for general adult medical examination without abnormal findings: Secondary | ICD-10-CM | POA: Diagnosis not present

## 2019-02-05 DIAGNOSIS — Z6830 Body mass index (BMI) 30.0-30.9, adult: Secondary | ICD-10-CM | POA: Diagnosis not present

## 2019-02-05 DIAGNOSIS — E669 Obesity, unspecified: Secondary | ICD-10-CM | POA: Diagnosis not present

## 2019-02-05 DIAGNOSIS — Z79899 Other long term (current) drug therapy: Secondary | ICD-10-CM | POA: Diagnosis not present

## 2019-02-05 DIAGNOSIS — Z90710 Acquired absence of both cervix and uterus: Secondary | ICD-10-CM | POA: Diagnosis not present

## 2019-02-26 DIAGNOSIS — Z1231 Encounter for screening mammogram for malignant neoplasm of breast: Secondary | ICD-10-CM | POA: Diagnosis not present

## 2019-02-26 DIAGNOSIS — Z01419 Encounter for gynecological examination (general) (routine) without abnormal findings: Secondary | ICD-10-CM | POA: Diagnosis not present

## 2019-02-26 DIAGNOSIS — Z1272 Encounter for screening for malignant neoplasm of vagina: Secondary | ICD-10-CM | POA: Diagnosis not present

## 2019-03-10 DIAGNOSIS — M2550 Pain in unspecified joint: Secondary | ICD-10-CM | POA: Diagnosis not present

## 2019-03-10 DIAGNOSIS — I10 Essential (primary) hypertension: Secondary | ICD-10-CM | POA: Diagnosis not present

## 2019-03-10 DIAGNOSIS — F411 Generalized anxiety disorder: Secondary | ICD-10-CM | POA: Diagnosis not present

## 2019-03-10 DIAGNOSIS — Z6829 Body mass index (BMI) 29.0-29.9, adult: Secondary | ICD-10-CM | POA: Diagnosis not present

## 2019-03-10 DIAGNOSIS — E663 Overweight: Secondary | ICD-10-CM | POA: Diagnosis not present

## 2019-03-10 DIAGNOSIS — K7589 Other specified inflammatory liver diseases: Secondary | ICD-10-CM | POA: Diagnosis not present

## 2019-05-12 DIAGNOSIS — L814 Other melanin hyperpigmentation: Secondary | ICD-10-CM | POA: Diagnosis not present

## 2019-05-12 DIAGNOSIS — D0472 Carcinoma in situ of skin of left lower limb, including hip: Secondary | ICD-10-CM | POA: Diagnosis not present

## 2019-05-12 DIAGNOSIS — Z85828 Personal history of other malignant neoplasm of skin: Secondary | ICD-10-CM | POA: Diagnosis not present

## 2019-05-12 DIAGNOSIS — L570 Actinic keratosis: Secondary | ICD-10-CM | POA: Diagnosis not present

## 2019-05-12 DIAGNOSIS — L821 Other seborrheic keratosis: Secondary | ICD-10-CM | POA: Diagnosis not present

## 2019-06-10 DIAGNOSIS — E7800 Pure hypercholesterolemia, unspecified: Secondary | ICD-10-CM | POA: Diagnosis not present

## 2019-06-10 DIAGNOSIS — Z6829 Body mass index (BMI) 29.0-29.9, adult: Secondary | ICD-10-CM | POA: Diagnosis not present

## 2019-06-10 DIAGNOSIS — I10 Essential (primary) hypertension: Secondary | ICD-10-CM | POA: Diagnosis not present

## 2019-06-10 DIAGNOSIS — M2550 Pain in unspecified joint: Secondary | ICD-10-CM | POA: Diagnosis not present

## 2019-06-10 DIAGNOSIS — E663 Overweight: Secondary | ICD-10-CM | POA: Diagnosis not present

## 2019-06-10 DIAGNOSIS — M19049 Primary osteoarthritis, unspecified hand: Secondary | ICD-10-CM | POA: Diagnosis not present

## 2019-06-30 DIAGNOSIS — Z23 Encounter for immunization: Secondary | ICD-10-CM | POA: Diagnosis not present

## 2019-08-03 DIAGNOSIS — M7662 Achilles tendinitis, left leg: Secondary | ICD-10-CM | POA: Diagnosis not present

## 2019-08-03 DIAGNOSIS — M7661 Achilles tendinitis, right leg: Secondary | ICD-10-CM | POA: Diagnosis not present

## 2019-08-12 DIAGNOSIS — E663 Overweight: Secondary | ICD-10-CM | POA: Diagnosis not present

## 2019-08-12 DIAGNOSIS — Z6829 Body mass index (BMI) 29.0-29.9, adult: Secondary | ICD-10-CM | POA: Diagnosis not present

## 2019-08-12 DIAGNOSIS — I10 Essential (primary) hypertension: Secondary | ICD-10-CM | POA: Diagnosis not present

## 2019-08-17 DIAGNOSIS — Z96652 Presence of left artificial knee joint: Secondary | ICD-10-CM | POA: Diagnosis not present

## 2019-08-17 DIAGNOSIS — Z471 Aftercare following joint replacement surgery: Secondary | ICD-10-CM | POA: Diagnosis not present

## 2019-09-03 DIAGNOSIS — I10 Essential (primary) hypertension: Secondary | ICD-10-CM | POA: Diagnosis not present

## 2019-09-03 DIAGNOSIS — Z6829 Body mass index (BMI) 29.0-29.9, adult: Secondary | ICD-10-CM | POA: Diagnosis not present

## 2019-09-03 DIAGNOSIS — E663 Overweight: Secondary | ICD-10-CM | POA: Diagnosis not present

## 2019-10-28 DIAGNOSIS — M7730 Calcaneal spur, unspecified foot: Secondary | ICD-10-CM | POA: Insufficient documentation

## 2019-10-28 DIAGNOSIS — M6701 Short Achilles tendon (acquired), right ankle: Secondary | ICD-10-CM | POA: Insufficient documentation

## 2019-10-28 DIAGNOSIS — M7662 Achilles tendinitis, left leg: Secondary | ICD-10-CM | POA: Insufficient documentation

## 2019-10-28 DIAGNOSIS — M898X7 Other specified disorders of bone, ankle and foot: Secondary | ICD-10-CM | POA: Diagnosis not present

## 2019-10-28 DIAGNOSIS — M7661 Achilles tendinitis, right leg: Secondary | ICD-10-CM | POA: Diagnosis not present

## 2019-11-10 DIAGNOSIS — L309 Dermatitis, unspecified: Secondary | ICD-10-CM | POA: Diagnosis not present

## 2019-11-10 DIAGNOSIS — Z85828 Personal history of other malignant neoplasm of skin: Secondary | ICD-10-CM | POA: Diagnosis not present

## 2019-11-10 DIAGNOSIS — C44519 Basal cell carcinoma of skin of other part of trunk: Secondary | ICD-10-CM | POA: Diagnosis not present

## 2019-11-10 DIAGNOSIS — L820 Inflamed seborrheic keratosis: Secondary | ICD-10-CM | POA: Diagnosis not present

## 2019-11-10 DIAGNOSIS — L570 Actinic keratosis: Secondary | ICD-10-CM | POA: Diagnosis not present

## 2019-11-10 DIAGNOSIS — B078 Other viral warts: Secondary | ICD-10-CM | POA: Diagnosis not present

## 2019-11-10 DIAGNOSIS — L821 Other seborrheic keratosis: Secondary | ICD-10-CM | POA: Diagnosis not present

## 2019-11-10 DIAGNOSIS — L814 Other melanin hyperpigmentation: Secondary | ICD-10-CM | POA: Diagnosis not present

## 2019-11-10 DIAGNOSIS — D225 Melanocytic nevi of trunk: Secondary | ICD-10-CM | POA: Diagnosis not present

## 2019-11-11 DIAGNOSIS — M7662 Achilles tendinitis, left leg: Secondary | ICD-10-CM | POA: Diagnosis not present

## 2019-11-11 DIAGNOSIS — M7661 Achilles tendinitis, right leg: Secondary | ICD-10-CM | POA: Diagnosis not present

## 2019-11-16 DIAGNOSIS — M7661 Achilles tendinitis, right leg: Secondary | ICD-10-CM | POA: Diagnosis not present

## 2019-11-16 DIAGNOSIS — M7662 Achilles tendinitis, left leg: Secondary | ICD-10-CM | POA: Diagnosis not present

## 2019-11-18 DIAGNOSIS — M7661 Achilles tendinitis, right leg: Secondary | ICD-10-CM | POA: Diagnosis not present

## 2019-11-18 DIAGNOSIS — M7662 Achilles tendinitis, left leg: Secondary | ICD-10-CM | POA: Diagnosis not present

## 2019-11-18 DIAGNOSIS — C44519 Basal cell carcinoma of skin of other part of trunk: Secondary | ICD-10-CM | POA: Diagnosis not present

## 2019-11-25 DIAGNOSIS — M7661 Achilles tendinitis, right leg: Secondary | ICD-10-CM | POA: Diagnosis not present

## 2019-11-25 DIAGNOSIS — M7662 Achilles tendinitis, left leg: Secondary | ICD-10-CM | POA: Diagnosis not present

## 2019-11-30 DIAGNOSIS — M7662 Achilles tendinitis, left leg: Secondary | ICD-10-CM | POA: Diagnosis not present

## 2019-11-30 DIAGNOSIS — M7661 Achilles tendinitis, right leg: Secondary | ICD-10-CM | POA: Diagnosis not present

## 2019-12-02 DIAGNOSIS — M7662 Achilles tendinitis, left leg: Secondary | ICD-10-CM | POA: Diagnosis not present

## 2019-12-02 DIAGNOSIS — M7661 Achilles tendinitis, right leg: Secondary | ICD-10-CM | POA: Diagnosis not present

## 2019-12-03 ENCOUNTER — Ambulatory Visit: Payer: BLUE CROSS/BLUE SHIELD | Admitting: Gastroenterology

## 2019-12-03 ENCOUNTER — Other Ambulatory Visit: Payer: Self-pay

## 2019-12-03 ENCOUNTER — Encounter: Payer: Self-pay | Admitting: Gastroenterology

## 2019-12-03 VITALS — BP 124/84 | HR 96 | Temp 98.2°F | Ht 68.0 in | Wt 196.1 lb

## 2019-12-03 DIAGNOSIS — K582 Mixed irritable bowel syndrome: Secondary | ICD-10-CM

## 2019-12-03 DIAGNOSIS — R198 Other specified symptoms and signs involving the digestive system and abdomen: Secondary | ICD-10-CM | POA: Diagnosis not present

## 2019-12-03 DIAGNOSIS — R1011 Right upper quadrant pain: Secondary | ICD-10-CM

## 2019-12-03 DIAGNOSIS — Z800 Family history of malignant neoplasm of digestive organs: Secondary | ICD-10-CM

## 2019-12-03 MED ORDER — CLENPIQ 10-3.5-12 MG-GM -GM/160ML PO SOLN: 1.0000 | Freq: Once | ORAL | 0 refills | Status: AC

## 2019-12-03 NOTE — Patient Instructions (Signed)
You have been scheduled for a colonoscopy. Please follow written instructions given to you at your visit today.  Please pick up your prep supplies at the pharmacy within the next 1-3 days. If you use inhalers (even only as needed), please bring them with you on the day of your procedure. Your physician has requested that you go to www.[email or web address].com and enter the access code given to you at your visit today. This web site gives a general overview about your procedure. However, you should still follow specific instructions given to you by our office regarding your preparation for the procedure.  Thank you,  Dr. Jackquline Denmark

## 2019-12-03 NOTE — Progress Notes (Signed)
Chief Complaint: For colonoscopy.  Referring Provider:  Greig Right, MD      ASSESSMENT AND PLAN;   #1. FH colon cancer (multiple second-degree relatives)  #2. LLQ/RLQ pain with change in stool caliber and associated constipation  #3. H/O diverticulitis s/p sigmoid resection 03/2017.  Recent likely attack of diverticulitis, resolved spontaneously.  Plan; -Start colace 1 po QD. -Increase water -Start Metamucil 1 wafer/per day. -Proceed with colonoscopy. Discussed risks & benefits. (Risks including rare perforation req laparotomy, bleeding after bx/polypectomy req blood transfusion, rarely missing neoplasms, risks of anesthesia/sedation). Benefits outweigh the risks. Patient agrees to proceed. All the questions were answered. Consent forms given for review. -If still with problems and the above WU is negative, proceed with CT scan abdo/pelvis.    HPI:    Laura Hahn is a 69 y.o. female  Had recent episode of right-sided sharp abdominal pain, thought to be diverticulitis, resolved without antibiotics. This was associated with some constipation. She has started taking Metamucil Has been drinking plenty of water.  There has been some change in stool caliber-pencillike. Intermittent lower abdominal pain and bloating which gets better with defecation. Lot of gas and abdominal bloating.  No weight loss.  In fact she has gained weight.  Denies having any upper GI symptoms including nausea, vomiting, heartburn, odynophagia or dysphagia.  Just got a new house (second home) at [facility].  Seen by Dr. Burnett Sheng, advised colonoscopy.  Past GI work-up: -CT Abdo/pelvis 12/2016: Sigmoid diverticulitis.  Focal narrowing of the descending colon with associated twisting of mesentery although no obstruction.  Findings may be due to internal hernia versus adhesions.  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 -Colonoscopy ([facility])  08/2015: Moderate predominantly left colonic diverticulosis.  Otherwise normal to TI.  The colon was highly redundant.  Past Medical History:  Diagnosis Date  . Arthritis    neck  . Diverticulosis   . Family history of colon cancer   . Family history of colonic polyps   . History of kidney infection   . Hypertension    states under control with meds., has been on med. x 30 yr.  . IBS (irritable bowel syndrome)   . Mass of finger of left hand 06/2015   small finger  . Osteoarthritis   . PONV (postoperative nausea and vomiting)     Past Surgical History:  Procedure Laterality Date  . ABDOMINAL HYSTERECTOMY     partial   . APPENDECTOMY    . BOWEL RESECTION  03/2017   Dr. [facility]  . breast lift    . COLONOSCOPY  08/22/2015   Moderate predominantly left diverticulosis. Otherwise normal colonoscopy to terminal ilium. The colon was highly redundant.   Marland Kitchen CYST EXCISION Right    great toe  . FINGER GANGLION CYST EXCISION Right   . KNEE ARTHROSCOPY Right 05/28/2008  . LIPOSUCTION    . MASS EXCISION Left 07/04/2015   Procedure: EXCISION MASS LEFT SMALL FINGER ;  Surgeon: Cristine Polio, MD;  Location: [place];  Service: Plastics;  Laterality: Left;  Marland Kitchen MASTOPEXY  12/2014  . SKIN FULL THICKNESS GRAFT Left 07/04/2015   Procedure: SKIN GRAFT FULL THICKNESS;  Surgeon: Cristine Polio, MD;  Location: [place];  Service: Plastics;  Laterality: Left;  . TONSILLECTOMY    . TOTAL KNEE ARTHROPLASTY Right 05/27/2014   Procedure: TOTAL RIGHT KNEE ARTHROPLASTY;  Surgeon: Johnn Hai, MD;  Location: [facility];  Service: Orthopedics;  Laterality: Right;  . TOTAL KNEE ARTHROPLASTY Left 11/20/2017   Procedure: TOTAL LEFT KNEE ARTHROPLASTY;  Surgeon: Susa Day, MD;  Location: [facility];  Service: Orthopedics;  Laterality: Left;  . WEDGE RESECTION     ovary  . WISDOM TOOTH EXTRACTION      Family History  Problem Relation Age of Onset  .  Diverticulitis Mother   . Colon polyps Mother   . Pancreatic cancer Cousin        1st cousin  . Colon cancer Maternal Aunt   . Colon cancer Maternal Uncle   . Colon cancer Maternal Great-grandmother   . Pancreatic cancer Cousin     Social History   Tobacco Use  . Smoking status: Never Smoker  . Smokeless tobacco: Never Used  Substance Use Topics  . Alcohol use: Yes    Comment: 2 bottles wine/week  . Drug use: No    Current Outpatient Medications  Medication Sig Dispense Refill  . carvedilol (COREG) 6.25 MG tablet 1 tablet 2 (two) times daily.    . Cholecalciferol (VITAMIN D3) 25 MCG (1000 UT) CAPS Take 1 capsule by mouth daily.    Marland Kitchen docusate sodium (COLACE) 100 MG capsule Take 1 capsule (100 mg total) by mouth 2 (two) times daily as needed for mild constipation. 30 capsule 1  . estrogen-methylTESTOSTERone (ESTRATEST) 1.25-2.5 MG per tablet Take 1 tablet by mouth every other day.     . irbesartan-hydrochlorothiazide (AVALIDE) 300-12.5 MG tablet 2 tablets daily.    . Lactobacillus (ACIDOPHILUS PO) Take 1 capsule by mouth daily.     . methocarbamol (ROBAXIN) 500 MG tablet Take 1 tablet (500 mg total) by mouth every 6 (six) hours as needed for muscle spasms. (Patient taking differently: Take 500 mg by mouth as needed for muscle spasms. ) 40 tablet 1  . Multiple Vitamin (MULTIVITAMIN WITH MINERALS) TABS tablet Take 1 tablet by mouth daily.    Marland Kitchen aspirin EC 325 MG tablet Take 1 tablet (325 mg total) by mouth 2 (two) times daily. (Patient not taking: Reported on 12/03/2019) 60 tablet 1  . b complex vitamins capsule Take 1 capsule by mouth as needed.     No current facility-administered medications for this visit.    Allergies  Allergen Reactions  . Lisinopril Cough  . Metoprolol Rash    Review of Systems:  Constitutional: Denies fever, chills, diaphoresis, appetite change and fatigue.  HEENT: Denies photophobia, eye pain, redness, hearing loss, ear pain, congestion, sore throat,  rhinorrhea, sneezing, mouth sores, neck pain, neck stiffness and tinnitus.   Respiratory: Denies SOB, DOE, cough, chest tightness,  and wheezing.   Cardiovascular: Denies chest pain, palpitations and leg swelling.  Genitourinary: Denies dysuria, urgency, frequency, hematuria, flank pain and difficulty urinating.  Musculoskeletal: Denies myalgias, back pain, joint swelling, arthralgias and gait problem.  Skin: No rash.  Neurological: Denies dizziness, seizures, syncope, weakness, light-headedness, numbness and headaches.  Hematological: Denies adenopathy. Easy bruising, personal or family bleeding history  Psychiatric/Behavioral: No anxiety or depression     Physical Exam:    BP 124/84   Pulse 96   Temp 98.2 F (36.8 C)   Ht 5\' 8"  (1.727 m)   Wt 196 lb 2 oz (89 kg)   BMI 29.82 kg/m  Wt Readings from Last 3 Encounters:  12/03/19 196 lb 2 oz (89 kg)  11/20/17 190 lb (86.2 kg)  11/12/17 190 lb 3 oz (86.3 kg)   Constitutional:  Well-developed, in no acute distress. Psychiatric: Normal  mood and affect. Behavior is normal. HEENT: Pupils normal.  Conjunctivae are normal. No scleral icterus. Neck supple.  Cardiovascular: Normal rate, regular rhythm. No edema Pulmonary/chest: Effort normal and breath sounds normal. No wheezing, rales or rhonchi. Abdominal: Soft, nondistended. Nontender. Bowel sounds active throughout. There are no masses palpable. No hepatomegaly. Rectal:  defered Neurological: Alert and oriented to person place and time. Skin: Skin is warm and dry. No rashes noted.  Data Reviewed: I have personally reviewed following labs and imaging studies  CBC: CBC Latest Ref Rng & Units 11/23/2017 11/22/2017 11/21/2017  WBC 4.0 - 10.5 K/uL 8.1 9.0 9.3  Hemoglobin 12.0 - 15.0 g/dL 11.3(L) 12.3 12.2  Hematocrit 36.0 - 46.0 % 33.0(L) 36.6 36.0  Platelets 150 - 400 K/uL 221 231 222    CMP: CMP Latest Ref Rng & Units 11/21/2017 11/12/2017 07/01/2015  Glucose 65 - 99 mg/dL 128(H) 117(H)  97  BUN 6 - 20 mg/dL 11 23(H) 20  Creatinine 0.44 - 1.00 mg/dL 0.79 0.95 0.89  Sodium 135 - 145 mmol/L 131(L) 137 135  Potassium 3.5 - 5.1 mmol/L 4.1 4.1 4.7  Chloride 101 - 111 mmol/L 97(L) 100(L) 99(L)  CO2 22 - 32 mmol/L 27 25 29   Calcium 8.9 - 10.3 mg/dL 9.0 9.9 9.4     Carmell Austria, MD 12/03/2019, 2:55 PM  Cc: Greig Right, MD

## 2019-12-07 DIAGNOSIS — M7662 Achilles tendinitis, left leg: Secondary | ICD-10-CM | POA: Diagnosis not present

## 2019-12-07 DIAGNOSIS — M7661 Achilles tendinitis, right leg: Secondary | ICD-10-CM | POA: Diagnosis not present

## 2019-12-09 DIAGNOSIS — M7662 Achilles tendinitis, left leg: Secondary | ICD-10-CM | POA: Diagnosis not present

## 2019-12-09 DIAGNOSIS — M7661 Achilles tendinitis, right leg: Secondary | ICD-10-CM | POA: Diagnosis not present

## 2019-12-17 ENCOUNTER — Encounter: Payer: Self-pay | Admitting: Gastroenterology

## 2019-12-23 DIAGNOSIS — M7661 Achilles tendinitis, right leg: Secondary | ICD-10-CM | POA: Diagnosis not present

## 2019-12-24 DIAGNOSIS — H2513 Age-related nuclear cataract, bilateral: Secondary | ICD-10-CM | POA: Diagnosis not present

## 2019-12-24 DIAGNOSIS — H04123 Dry eye syndrome of bilateral lacrimal glands: Secondary | ICD-10-CM | POA: Diagnosis not present

## 2019-12-24 DIAGNOSIS — H524 Presbyopia: Secondary | ICD-10-CM | POA: Diagnosis not present

## 2019-12-24 DIAGNOSIS — Z83518 Family history of other specified eye disorder: Secondary | ICD-10-CM | POA: Diagnosis not present

## 2019-12-31 ENCOUNTER — Encounter: Payer: Self-pay | Admitting: Gastroenterology

## 2019-12-31 ENCOUNTER — Other Ambulatory Visit: Payer: Self-pay

## 2019-12-31 ENCOUNTER — Ambulatory Visit (AMBULATORY_SURGERY_CENTER): Payer: PPO | Admitting: Gastroenterology

## 2019-12-31 VITALS — BP 141/90 | HR 68 | Temp 97.7°F | Resp 14 | Ht 68.0 in | Wt 196.0 lb

## 2019-12-31 DIAGNOSIS — Z800 Family history of malignant neoplasm of digestive organs: Secondary | ICD-10-CM

## 2019-12-31 DIAGNOSIS — K582 Mixed irritable bowel syndrome: Secondary | ICD-10-CM

## 2019-12-31 DIAGNOSIS — R194 Change in bowel habit: Secondary | ICD-10-CM

## 2019-12-31 DIAGNOSIS — R198 Other specified symptoms and signs involving the digestive system and abdomen: Secondary | ICD-10-CM

## 2019-12-31 DIAGNOSIS — K648 Other hemorrhoids: Secondary | ICD-10-CM

## 2019-12-31 DIAGNOSIS — K5730 Diverticulosis of large intestine without perforation or abscess without bleeding: Secondary | ICD-10-CM

## 2019-12-31 DIAGNOSIS — K5900 Constipation, unspecified: Secondary | ICD-10-CM | POA: Diagnosis not present

## 2019-12-31 MED ORDER — SODIUM CHLORIDE 0.9 % IV SOLN: 500.0000 mL | Freq: Once | INTRAVENOUS | Status: DC

## 2019-12-31 NOTE — Progress Notes (Signed)
PT taken to PACU. Monitors in place. VSS. Report given to RN. 

## 2019-12-31 NOTE — Patient Instructions (Signed)
[place], read all of the handouts given to you by your recovery room nurse.  You will need a repeat colonoscopy in five years.  Thank-you for choosing [place] for your healthcare needs today.  YOU HAD AN ENDOSCOPIC PROCEDURE TODAY AT [facility]:   Refer to the procedure report that was given to you for any specific questions about what was found during the examination.  If the procedure report does not answer your questions, please call your gastroenterologist to clarify.  If you requested that your care partner not be given the details of your procedure findings, then the procedure report has been included in a sealed envelope for you to review at your convenience later.  YOU SHOULD EXPECT: Some feelings of bloating in the abdomen. Passage of more gas than usual.  Walking can help get rid of the air that was put into your GI tract during the procedure and reduce the bloating. If you had a lower endoscopy (such as a colonoscopy or flexible sigmoidoscopy) you may notice spotting of blood in your stool or on the toilet paper. If you underwent a bowel prep for your procedure, you may not have a normal bowel movement for a few days.  Please Note:  You might notice some irritation and congestion in your nose or some drainage.  This is from the oxygen used during your procedure.  There is no need for concern and it should clear up in a day or so.  SYMPTOMS TO REPORT IMMEDIATELY:   Following lower endoscopy (colonoscopy or flexible sigmoidoscopy):  Excessive amounts of blood in the stool  Significant tenderness or worsening of abdominal pains  Swelling of the abdomen that is new, acute  Fever of 100F or higher   For urgent or emergent issues, a gastroenterologist can be reached at any hour by calling [phone]. Do not use MyChart messaging for urgent concerns.    DIET:  We do recommend a small meal at first, but then you may proceed to your regular diet.  Drink plenty of fluids but  you should avoid alcoholic beverages for 24 hours. Try to increase the fiber in your diet, and drink plenty of water.  ACTIVITY:  You should plan to take it easy for the rest of today and you should NOT DRIVE or use heavy machinery until tomorrow (because of the sedation medicines used during the test).    FOLLOW UP: Our staff will call the number listed on your records 48-72 hours following your procedure to check on you and address any questions or concerns that you may have regarding the information given to you following your procedure. If we do not reach you, we will leave a message.  We will attempt to reach you two times.  During this call, we will ask if you have developed any symptoms of COVID 19. If you develop any symptoms (ie: fever, flu-like symptoms, shortness of breath, cough etc.) before then, please call [phone].  If you test positive for Covid 19 in the 2 weeks post procedure, please call and report this information to [place].      SIGNATURES/CONFIDENTIALITY: You and/or your care partner have signed paperwork which will be entered into your electronic medical record.  These signatures attest to the fact that that the information above on your After Visit Summary has been reviewed and is understood.  Full responsibility of the confidentiality of this discharge information lies with you and/or your care-partner.

## 2019-12-31 NOTE — Op Note (Signed)
[place] Patient Name: Laura Hahn Procedure Date: 12/31/2019 8:29 AM MRN: [id number] Endoscopist: Jackquline Denmark , MD Age: 69 Referring MD:  Date of Birth: 06-19-1951 Gender: Female Account #: [id number] Procedure:                Colonoscopy Indications:              #1. FH colon cancer (multiple second-degree                            relatives)                           #2. LLQ/RLQ pain with change in stool caliber and                            associated constipation                           #3. H/O diverticulitis s/p sigmoid resection 03/2017. Medicines:                Monitored Anesthesia Care Procedure:                Pre-Anesthesia Assessment:                           - Prior to the procedure, a History and Physical                            was performed, and patient medications and                            allergies were reviewed. The patient's tolerance of                            previous anesthesia was also reviewed. The risks                            and benefits of the procedure and the sedation                            options and risks were discussed with the patient.                            All questions were answered, and informed consent                            was obtained. Prior Anticoagulants: The patient has                            taken no previous anticoagulant or antiplatelet                            agents. ASA Grade Assessment: II - A patient with  mild systemic disease. After reviewing the risks                            and benefits, the patient was deemed in                            satisfactory condition to undergo the procedure.                           After obtaining informed consent, the colonoscope                            was passed under direct vision. Throughout the                            procedure, the patient's blood pressure, pulse, and                            oxygen  saturations were monitored continuously. The                            Colonoscope was introduced through the anus and                            advanced to the the cecum, identified by                            appendiceal orifice and ileocecal valve. The                            colonoscopy was performed without difficulty. The                            patient tolerated the procedure well. The quality                            of the bowel preparation was good except in the                            right side of the colon where there was stool which                            could not be fully washed. Aggressive suctioning                            and aspiration was performed. Overall over 90 to                            95% colonic mucosa was visualized satisfactorily.                            The ileocecal valve, appendiceal orifice, and  rectum were photographed. Scope In: 8:38:05 AM Scope Out: 8:57:19 AM Scope Withdrawal Time: 0 hours 5 minutes 3 seconds  Total Procedure Duration: 0 hours 19 minutes 14 seconds  Findings:                 There was evidence of a prior end-to-end                            colo-colonic anastomosis in the sigmoid colon, 25                            cm from the anal verge. This was patent and was                            characterized by healthy appearing mucosa. The                            anastomosis was traversed.                           A few small-mouthed diverticula were found in the                            neo-sigmoid colon, descending colon and ascending                            colon.                           Non-bleeding internal hemorrhoids were found during                            retroflexion. The hemorrhoids were small.                           The exam was otherwise without abnormality on                            direct and retroflexion views. The colon was highly                             redundant. Complications:            No immediate complications. Estimated Blood Loss:     Estimated blood loss: none. Impression:               -Mild pancolonic diverticulosis.                           -S/P previous sigmoid resection. Patent anastomosis.                           -Otherwise normal colonoscopy to cecum. The colon                            was highly redundant. Recommendation:           - Patient has a contact  number available for                            emergencies. The signs and symptoms of potential                            delayed complications were discussed with the                            patient. Return to normal activities tomorrow.                            Written discharge instructions were provided to the                            patient.                           - Resume previous diet.                           - Continue present medications.                           - Repeat colonoscopy in 5 years for screening                            purposes d/t strong family history. Earlier, if                            with any new problems or if there is any change in                            family history.                           - Return to GI clinic PRN. Jackquline Denmark, MD 12/31/2019 9:05:28 AM This report has been signed electronically.

## 2019-12-31 NOTE — Progress Notes (Signed)
Temp check by:JB Vital check by:CW  The medical and surgical history was reviewed and verified with the patient. 

## 2020-01-04 ENCOUNTER — Telehealth: Payer: Self-pay

## 2020-01-04 ENCOUNTER — Telehealth: Payer: Self-pay | Admitting: *Deleted

## 2020-01-04 NOTE — Telephone Encounter (Signed)
  Follow up Call-  Call back number 12/31/2019  Post procedure Call Back phone  # [phone]  Permission to leave phone message Yes  Some recent data might be hidden     Patient questions:  Do you have a fever, pain , or abdominal swelling? No. Pain Score  0 *  Have you tolerated food without any problems? Yes.    Have you been able to return to your normal activities? Yes.    Do you have any questions about your discharge instructions: Diet   No. Medications  No. Follow up visit  No.  Do you have questions or concerns about your Care? No.  Actions: * If pain score is 4 or above: No action needed, pain <4.  1. Have you developed a fever since your procedure? no  2.   Have you had an respiratory symptoms (SOB or cough) since your procedure? no  3.   Have you tested positive for COVID 19 since your procedure no  4.   Have you had any family members/close contacts diagnosed with the COVID 19 since your procedure?  no   If yes to any of these questions please route to Joylene John, RN and Erenest Rasher, RN

## 2020-01-04 NOTE — Telephone Encounter (Signed)
Left message on follow up call. 

## 2020-01-13 DIAGNOSIS — Z23 Encounter for immunization: Secondary | ICD-10-CM | POA: Diagnosis not present

## 2020-01-13 DIAGNOSIS — I10 Essential (primary) hypertension: Secondary | ICD-10-CM | POA: Diagnosis not present

## 2020-01-13 DIAGNOSIS — N951 Menopausal and female climacteric states: Secondary | ICD-10-CM | POA: Diagnosis not present

## 2020-01-13 DIAGNOSIS — M150 Primary generalized (osteo)arthritis: Secondary | ICD-10-CM | POA: Diagnosis not present

## 2020-01-13 DIAGNOSIS — E669 Obesity, unspecified: Secondary | ICD-10-CM | POA: Diagnosis not present

## 2020-01-13 DIAGNOSIS — Z6830 Body mass index (BMI) 30.0-30.9, adult: Secondary | ICD-10-CM | POA: Diagnosis not present

## 2020-01-13 DIAGNOSIS — Z79899 Other long term (current) drug therapy: Secondary | ICD-10-CM | POA: Diagnosis not present

## 2020-01-13 DIAGNOSIS — E7800 Pure hypercholesterolemia, unspecified: Secondary | ICD-10-CM | POA: Diagnosis not present

## 2020-01-13 DIAGNOSIS — Z0000 Encounter for general adult medical examination without abnormal findings: Secondary | ICD-10-CM | POA: Diagnosis not present

## 2020-03-07 DIAGNOSIS — Z1231 Encounter for screening mammogram for malignant neoplasm of breast: Secondary | ICD-10-CM | POA: Diagnosis not present

## 2020-04-17 DIAGNOSIS — U071 COVID-19: Secondary | ICD-10-CM

## 2020-04-17 HISTORY — DX: COVID-19: U07.1

## 2020-04-26 DIAGNOSIS — M7661 Achilles tendinitis, right leg: Secondary | ICD-10-CM | POA: Diagnosis not present

## 2020-05-02 ENCOUNTER — Other Ambulatory Visit: Payer: Self-pay | Admitting: Podiatry

## 2020-05-02 ENCOUNTER — Ambulatory Visit: Payer: PPO | Admitting: Podiatry

## 2020-05-02 ENCOUNTER — Ambulatory Visit (INDEPENDENT_AMBULATORY_CARE_PROVIDER_SITE_OTHER): Payer: PPO

## 2020-05-02 ENCOUNTER — Other Ambulatory Visit: Payer: Self-pay

## 2020-05-02 DIAGNOSIS — M7662 Achilles tendinitis, left leg: Secondary | ICD-10-CM

## 2020-05-02 DIAGNOSIS — M7661 Achilles tendinitis, right leg: Secondary | ICD-10-CM

## 2020-05-02 DIAGNOSIS — M79671 Pain in right foot: Secondary | ICD-10-CM

## 2020-05-02 DIAGNOSIS — M7732 Calcaneal spur, left foot: Secondary | ICD-10-CM | POA: Diagnosis not present

## 2020-05-02 DIAGNOSIS — M216X9 Other acquired deformities of unspecified foot: Secondary | ICD-10-CM | POA: Diagnosis not present

## 2020-05-02 DIAGNOSIS — M436 Torticollis: Secondary | ICD-10-CM | POA: Insufficient documentation

## 2020-05-02 DIAGNOSIS — M7731 Calcaneal spur, right foot: Secondary | ICD-10-CM

## 2020-05-02 DIAGNOSIS — R200 Anesthesia of skin: Secondary | ICD-10-CM | POA: Insufficient documentation

## 2020-05-02 MED ORDER — OMEPRAZOLE 20 MG PO CPDR: 20.0000 mg | DELAYED_RELEASE_CAPSULE | Freq: Every day | ORAL | 0 refills | Status: DC

## 2020-05-02 MED ORDER — MELOXICAM 15 MG PO TABS: 15.0000 mg | ORAL_TABLET | Freq: Every day | ORAL | 0 refills | Status: DC

## 2020-05-02 NOTE — Progress Notes (Signed)
  Subjective:  Patient ID: Laura Hahn, female    DOB: 07-21-1951,  MRN: [id number]  Chief Complaint  Patient presents with  . Foot Pain    BL back heel pain ( Rt>LT) x 1 1/2 wk w/ extreme pain (stone bruise) - went saw PCP 1 wk ago, was dx w/ tendoniitis and they gave pt prednisone 20 mg -pt states," got better with prednisone but its starting to come back gain." -w/ swelling Tx: icing, resting and stretching - 2 knee replacement 2 yrs ago     69 y.o. female presents with the above complaint. History confirmed with patient.   Objective:  Physical Exam: warm, good capillary refill, no trophic changes or ulcerative lesions, normal DP and PT pulses and normal sensory exam. Left Foot: POP Achilles with prominent Haglund deformity, no dell, good Achilles strength. +Silverskiold test. Decreased AJ ROM. Right Foot: POP Achilles with prominent Haglund deformity, no dell, good Achilles strength. +Silverskiold test. Decreased AJ ROM.  No images are attached to the encounter.  Radiographs: X-ray of both feet: no fracture, dislocation, swelling or degenerative changes noted, plantar calcaneal spur, posterior calcaneal spur and Haglund deformity noted Assessment:   1. Achilles tendinitis of both lower extremities   2. Equinus deformity of foot   3. Calcaneal spur of left foot   4. Calcaneal spur of right foot      Plan:  Patient was evaluated and treated and all questions answered.  Achilles Tendonitis -XR reviewed with patient -Educated on stretching and icing of the affected limb. -Night splint dispensed.  -Complete prednisone via taper -Rx Mobic and Omeprazole. Start after finishing Prednisone  Return in about 4 weeks (around 05/30/2020).

## 2020-05-02 NOTE — Patient Instructions (Addendum)
Take 2 tablets of prednisone today, 1.5 tablets tomorrow, 1 tablet the next day, then a half tablet the final day.  Achilles Tendinitis  with Rehab Achilles tendinitis is a disorder of the Achilles tendon. The Achilles tendon connects the large calf muscles (Gastrocnemius and Soleus) to the heel bone (calcaneus). This tendon is sometimes called the heel cord. It is important for pushing-off and standing on your toes and is important for walking, running, or jumping. Tendinitis is often caused by overuse and repetitive microtrauma. SYMPTOMS  Pain, tenderness, swelling, warmth, and redness may occur over the Achilles tendon even at rest.  Pain with pushing off, or flexing or extending the ankle.  Pain that is worsened after or during activity. CAUSES   Overuse sometimes seen with rapid increase in exercise programs or in sports requiring running and jumping.  Poor physical conditioning (strength and flexibility or endurance).  Running sports, especially training running down hills.  Inadequate warm-up before practice or play or failure to stretch before participation.  Injury to the tendon. PREVENTION   Warm up and stretch before practice or competition.  Allow time for adequate rest and recovery between practices and competition.  Keep up conditioning.  Keep up ankle and leg flexibility.  Improve or keep muscle strength and endurance.  Improve cardiovascular fitness.  Use proper technique.  Use proper equipment (shoes, skates).  To help prevent recurrence, taping, protective strapping, or an adhesive bandage may be recommended for several weeks after healing is complete. PROGNOSIS   Recovery may take weeks to several months to heal.  Longer recovery is expected if symptoms have been prolonged.  Recovery is usually quicker if the inflammation is due to a direct blow as compared with overuse or sudden strain. RELATED COMPLICATIONS   Healing time will be prolonged if  the condition is not correctly treated. The injury must be given plenty of time to heal.  Symptoms can reoccur if activity is resumed too soon.  Untreated, tendinitis may increase the risk of tendon rupture requiring additional time for recovery and possibly surgery. TREATMENT   The first treatment consists of rest anti-inflammatory medication, and ice to relieve the pain.  Stretching and strengthening exercises after resolution of pain will likely help reduce the risk of recurrence. Referral to a physical therapist or athletic trainer for further evaluation and treatment may be helpful.  A walking boot or cast may be recommended to rest the Achilles tendon. This can help break the cycle of inflammation and microtrauma.  Arch supports (orthotics) may be prescribed or recommended by your caregiver as an adjunct to therapy and rest.  Surgery to remove the inflamed tendon lining or degenerated tendon tissue is rarely necessary and has shown less than predictable results. MEDICATION   Nonsteroidal anti-inflammatory medications, such as aspirin and ibuprofen, may be used for pain and inflammation relief. Do not take within 7 days before surgery. Take these as directed by your caregiver. Contact your caregiver immediately if any bleeding, stomach upset, or signs of allergic reaction occur. Other minor pain relievers, such as acetaminophen, may also be used.  Pain relievers may be prescribed as necessary by your caregiver. Do not take prescription pain medication for longer than 4 to 7 days. Use only as directed and only as much as you need.  Cortisone injections are rarely indicated. Cortisone injections may weaken tendons and predispose to rupture. It is better to give the condition more time to heal than to use them. HEAT AND COLD  Cold is  used to relieve pain and reduce inflammation for acute and chronic Achilles tendinitis. Cold should be applied for 10 to 15 minutes every 2 to 3 hours for  inflammation and pain and immediately after any activity that aggravates your symptoms. Use ice packs or an ice massage.  Heat may be used before performing stretching and strengthening activities prescribed by your caregiver. Use a heat pack or a warm soak. SEEK MEDICAL CARE IF:  Symptoms get worse or do not improve in 2 weeks despite treatment.  New, unexplained symptoms develop. Drugs used in treatment may produce side effects.  EXERCISES:  RANGE OF MOTION (ROM) AND STRETCHING EXERCISES - Achilles Tendinitis  These exercises may help you when beginning to rehabilitate your injury. Your symptoms may resolve with or without further involvement from your physician, physical therapist or athletic trainer. While completing these exercises, remember:   Restoring tissue flexibility helps normal motion to return to the joints. This allows healthier, less painful movement and activity.  An effective stretch should be held for at least 30 seconds.  A stretch should never be painful. You should only feel a gentle lengthening or release in the stretched tissue.  STRETCH  Gastroc, Standing   Place hands on wall.  Extend right / left leg, keeping the front knee somewhat bent.  Slightly point your toes inward on your back foot.  Keeping your right / left heel on the floor and your knee straight, shift your weight toward the wall, not allowing your back to arch.  You should feel a gentle stretch in the right / left calf. Hold this position for 10 seconds. Repeat 3 times. Complete this stretch 2 times per day.  STRETCH  Soleus, Standing   Place hands on wall.  Extend right / left leg, keeping the other knee somewhat bent.  Slightly point your toes inward on your back foot.  Keep your right / left heel on the floor, bend your back knee, and slightly shift your weight over the back leg so that you feel a gentle stretch deep in your back calf.  Hold this position for 10 seconds. Repeat 3  times. Complete this stretch 2 times per day.  STRETCH  Gastrocsoleus, Standing  Note: This exercise can place a lot of stress on your foot and ankle. Please complete this exercise only if specifically instructed by your caregiver.   Place the ball of your right / left foot on a step, keeping your other foot firmly on the same step.  Hold on to the wall or a rail for balance.  Slowly lift your other foot, allowing your body weight to press your heel down over the edge of the step.  You should feel a stretch in your right / left calf.  Hold this position for 10 seconds.  Repeat this exercise with a slight bend in your knee. Repeat 3 times. Complete this stretch 2 times per day.   STRENGTHENING EXERCISES - Achilles Tendinitis These exercises may help you when beginning to rehabilitate your injury. They may resolve your symptoms with or without further involvement from your physician, physical therapist or athletic trainer. While completing these exercises, remember:   Muscles can gain both the endurance and the strength needed for everyday activities through controlled exercises.  Complete these exercises as instructed by your physician, physical therapist or athletic trainer. Progress the resistance and repetitions only as guided.  You may experience muscle soreness or fatigue, but the pain or discomfort you are trying to eliminate  should never worsen during these exercises. If this pain does worsen, stop and make certain you are following the directions exactly. If the pain is still present after adjustments, discontinue the exercise until you can discuss the trouble with your clinician.  STRENGTH - Plantar-flexors   Sit with your right / left leg extended. Holding onto both ends of a rubber exercise band/tubing, loop it around the ball of your foot. Keep a slight tension in the band.  Slowly push your toes away from you, pointing them downward.  Hold this position for 10 seconds.  Return slowly, controlling the tension in the band/tubing. Repeat 3 times. Complete this exercise 2 times per day.   STRENGTH - Plantar-flexors   Stand with your feet shoulder width apart. Steady yourself with a wall or table using as little support as needed.  Keeping your weight evenly spread over the width of your feet, rise up on your toes.*  Hold this position for 10 seconds. Repeat 3 times. Complete this exercise 2 times per day.  *If this is too easy, shift your weight toward your right / left leg until you feel challenged. Ultimately, you may be asked to do this exercise with your right / left foot only.  STRENGTH  Plantar-flexors, Eccentric  Note: This exercise can place a lot of stress on your foot and ankle. Please complete this exercise only if specifically instructed by your caregiver.   Place the balls of your feet on a step. With your hands, use only enough support from a wall or rail to keep your balance.  Keep your knees straight and rise up on your toes.  Slowly shift your weight entirely to your right / left toes and pick up your opposite foot. Gently and with controlled movement, lower your weight through your right / left foot so that your heel drops below the level of the step. You will feel a slight stretch in the back of your calf at the end position.  Use the healthy leg to help rise up onto the balls of both feet, then lower weight only on the right / left leg again. Build up to 15 repetitions. Then progress to 3 consecutive sets of 15 repetitions.*  After completing the above exercise, complete the same exercise with a slight knee bend (about 30 degrees). Again, build up to 15 repetitions. Then progress to 3 consecutive sets of 15 repetitions.* Perform this exercise 2 times per day.  *When you easily complete 3 sets of 15, your physician, physical therapist or athletic trainer may advise you to add resistance by wearing a backpack filled with additional  weight.  STRENGTH - Plantar Flexors, Seated   Sit on a chair that allows your feet to rest flat on the ground. If necessary, sit at the edge of the chair.  Keeping your toes firmly on the ground, lift your right / left heel as far as you can without increasing any discomfort in your ankle. Repeat 3 times. Complete this exercise 2 times a day.

## 2020-05-11 DIAGNOSIS — J069 Acute upper respiratory infection, unspecified: Secondary | ICD-10-CM | POA: Diagnosis not present

## 2020-05-11 DIAGNOSIS — R05 Cough: Secondary | ICD-10-CM | POA: Diagnosis not present

## 2020-05-16 ENCOUNTER — Other Ambulatory Visit: Payer: Self-pay | Admitting: Podiatry

## 2020-05-16 DIAGNOSIS — M7661 Achilles tendinitis, right leg: Secondary | ICD-10-CM

## 2020-05-29 ENCOUNTER — Other Ambulatory Visit: Payer: Self-pay | Admitting: Podiatry

## 2020-05-29 NOTE — Telephone Encounter (Signed)
Please Advise

## 2020-05-30 ENCOUNTER — Other Ambulatory Visit: Payer: Self-pay | Admitting: Podiatry

## 2020-06-02 ENCOUNTER — Ambulatory Visit: Payer: PPO | Admitting: Podiatry

## 2020-06-29 ENCOUNTER — Other Ambulatory Visit: Payer: Self-pay | Admitting: Podiatry

## 2020-06-29 NOTE — Telephone Encounter (Signed)
Please advise 

## 2020-07-04 DIAGNOSIS — Z23 Encounter for immunization: Secondary | ICD-10-CM | POA: Diagnosis not present

## 2020-07-04 DIAGNOSIS — R42 Dizziness and giddiness: Secondary | ICD-10-CM | POA: Diagnosis not present

## 2020-07-11 DIAGNOSIS — L570 Actinic keratosis: Secondary | ICD-10-CM | POA: Diagnosis not present

## 2020-07-11 DIAGNOSIS — L820 Inflamed seborrheic keratosis: Secondary | ICD-10-CM | POA: Diagnosis not present

## 2020-07-11 DIAGNOSIS — B078 Other viral warts: Secondary | ICD-10-CM | POA: Diagnosis not present

## 2020-07-11 DIAGNOSIS — D1801 Hemangioma of skin and subcutaneous tissue: Secondary | ICD-10-CM | POA: Diagnosis not present

## 2020-07-11 DIAGNOSIS — L821 Other seborrheic keratosis: Secondary | ICD-10-CM | POA: Diagnosis not present

## 2020-07-11 DIAGNOSIS — D225 Melanocytic nevi of trunk: Secondary | ICD-10-CM | POA: Diagnosis not present

## 2020-07-11 DIAGNOSIS — L814 Other melanin hyperpigmentation: Secondary | ICD-10-CM | POA: Diagnosis not present

## 2020-07-11 DIAGNOSIS — C44519 Basal cell carcinoma of skin of other part of trunk: Secondary | ICD-10-CM | POA: Diagnosis not present

## 2020-07-11 DIAGNOSIS — Z85828 Personal history of other malignant neoplasm of skin: Secondary | ICD-10-CM | POA: Diagnosis not present

## 2020-07-22 DIAGNOSIS — C44519 Basal cell carcinoma of skin of other part of trunk: Secondary | ICD-10-CM | POA: Diagnosis not present

## 2020-07-22 DIAGNOSIS — C44319 Basal cell carcinoma of skin of other parts of face: Secondary | ICD-10-CM | POA: Diagnosis not present

## 2020-07-22 DIAGNOSIS — Z85828 Personal history of other malignant neoplasm of skin: Secondary | ICD-10-CM | POA: Diagnosis not present

## 2020-07-27 ENCOUNTER — Other Ambulatory Visit: Payer: Self-pay | Admitting: Podiatry

## 2020-07-27 NOTE — Telephone Encounter (Signed)
Please advise 

## 2020-07-31 ENCOUNTER — Other Ambulatory Visit: Payer: Self-pay | Admitting: Podiatry

## 2020-08-01 ENCOUNTER — Other Ambulatory Visit: Payer: Self-pay | Admitting: Gastroenterology

## 2020-08-01 ENCOUNTER — Telehealth: Payer: Self-pay | Admitting: Gastroenterology

## 2020-08-01 DIAGNOSIS — R197 Diarrhea, unspecified: Secondary | ICD-10-CM

## 2020-08-01 NOTE — Telephone Encounter (Signed)
Lets check -CBC, CMP, CRP. -Stool studies for GI Pathogen (includes C. Diff), culture,O&P, giardia antigen, and Calprotectin  RG

## 2020-08-01 NOTE — Telephone Encounter (Signed)
Please advise 

## 2020-08-01 NOTE — Telephone Encounter (Signed)
Spoke to patient who reports an onset of diarrhea 2 months ago after coming down with Covid. She went on vacation after recovering from coved to a cabin with well water. Upon returning from the cabin she noticed her diarrhea worsening. Over the past few weeks she has been having multipal episodes of diarrhea and  waking up in the night with very foul smelling diarrhea. She takes imodium with minimal effect.No fever nausea or vomiting. HX of diverticulosis. Dr Lyndel Safe please advise on plane of care.

## 2020-08-01 NOTE — Telephone Encounter (Signed)
Spoke to patient to inform her of Dr Steve Rattler recommendations. She will go the the [place] location tomorrow for stool studies and blood work. All questions answered. Patient voiced understanding.

## 2020-08-01 NOTE — Telephone Encounter (Signed)
Patient called states she feels like she's having a case of Diverticulitis please advise

## 2020-08-02 ENCOUNTER — Other Ambulatory Visit (INDEPENDENT_AMBULATORY_CARE_PROVIDER_SITE_OTHER): Payer: PPO

## 2020-08-02 DIAGNOSIS — R197 Diarrhea, unspecified: Secondary | ICD-10-CM | POA: Diagnosis not present

## 2020-08-02 LAB — COMPREHENSIVE METABOLIC PANEL
ALT: 29 U/L (ref 0–35)
AST: 21 U/L (ref 0–37)
Albumin: 4.2 g/dL (ref 3.5–5.2)
Alkaline Phosphatase: 68 U/L (ref 39–117)
BUN: 24 mg/dL — ABNORMAL HIGH (ref 6–23)
CO2: 26 mEq/L (ref 19–32)
Calcium: 9.8 mg/dL (ref 8.4–10.5)
Chloride: 99 mEq/L (ref 96–112)
Creatinine, Ser: 0.8 mg/dL (ref 0.40–1.20)
GFR: 76.27 mL/min (ref >60.00)
Glucose, Bld: 109 mg/dL — ABNORMAL HIGH (ref 70–99)
Potassium: 4 mEq/L (ref 3.5–5.1)
Sodium: 135 mEq/L (ref 135–145)
Total Bilirubin: 0.5 mg/dL (ref 0.2–1.2)
Total Protein: 6.9 g/dL (ref 6.0–8.3)

## 2020-08-02 LAB — CBC WITH DIFFERENTIAL/PLATELET
Basophils Absolute: 0 10*3/uL (ref 0.0–0.1)
Basophils Relative: 0.4 % (ref 0.0–3.0)
Eosinophils Absolute: 0 10*3/uL (ref 0.0–0.7)
Eosinophils Relative: 0.1 % (ref 0.0–5.0)
HCT: 42.1 % (ref 36.0–46.0)
Hemoglobin: 14.2 g/dL (ref 12.0–15.0)
Lymphocytes Relative: 33.2 % (ref 12.0–46.0)
Lymphs Abs: 1.5 10*3/uL (ref 0.7–4.0)
MCHC: 33.8 g/dL (ref 30.0–36.0)
MCV: 101.8 fl — ABNORMAL HIGH (ref 78.0–100.0)
Monocytes Absolute: 0.6 10*3/uL (ref 0.1–1.0)
Monocytes Relative: 12 % (ref 3.0–12.0)
Neutro Abs: 2.5 10*3/uL (ref 1.4–7.7)
Neutrophils Relative %: 54.3 % (ref 43.0–77.0)
Platelets: 270 10*3/uL (ref 150.0–400.0)
RBC: 4.14 Mil/uL (ref 3.87–5.11)
RDW: 13.5 % (ref 11.5–15.5)
WBC: 4.6 10*3/uL (ref 4.0–10.5)

## 2020-08-02 LAB — HIGH SENSITIVITY CRP: CRP, High Sensitivity: 1.96 mg/L (ref 0.000–5.000)

## 2020-08-02 NOTE — Addendum Note (Signed)
Addended by: Trenda Moots on: 14/27/6701 08:56 AM   Modules accepted: Orders

## 2020-08-02 NOTE — Progress Notes (Signed)
Please inform the patient. All results normal or at baseline except for elevated MCV and increased BUN  -Need to drink plenty of water -Check C69 and folic acid. Send report to family physician

## 2020-08-03 ENCOUNTER — Other Ambulatory Visit: Payer: PPO

## 2020-08-03 DIAGNOSIS — R197 Diarrhea, unspecified: Secondary | ICD-10-CM

## 2020-08-04 ENCOUNTER — Other Ambulatory Visit: Payer: Self-pay | Admitting: Gastroenterology

## 2020-08-04 DIAGNOSIS — R198 Other specified symptoms and signs involving the digestive system and abdomen: Secondary | ICD-10-CM

## 2020-08-04 DIAGNOSIS — R197 Diarrhea, unspecified: Secondary | ICD-10-CM

## 2020-08-05 ENCOUNTER — Other Ambulatory Visit (INDEPENDENT_AMBULATORY_CARE_PROVIDER_SITE_OTHER): Payer: PPO

## 2020-08-05 DIAGNOSIS — R197 Diarrhea, unspecified: Secondary | ICD-10-CM

## 2020-08-05 DIAGNOSIS — R198 Other specified symptoms and signs involving the digestive system and abdomen: Secondary | ICD-10-CM | POA: Diagnosis not present

## 2020-08-05 LAB — B12 AND FOLATE PANEL
Folate: 6.3 ng/mL (ref >5.9)
Vitamin B-12: 236 pg/mL (ref 211–911)

## 2020-08-07 LAB — GI PROFILE, STOOL, PCR

## 2020-08-07 LAB — CALPROTECTIN, FECAL: Calprotectin, Fecal: 175 ug/g — ABNORMAL HIGH (ref 0–120)

## 2020-08-08 ENCOUNTER — Other Ambulatory Visit: Payer: Self-pay | Admitting: Gastroenterology

## 2020-08-08 MED ORDER — CYANOCOBALAMIN 1000 MCG/ML IJ SOLN: 1000.0000 ug | Freq: Once | INTRAMUSCULAR | Status: AC

## 2020-08-09 ENCOUNTER — Telehealth: Payer: Self-pay | Admitting: Gastroenterology

## 2020-08-09 LAB — OVA AND PARASITE EXAMINATION
CONCENTRATE RESULT:: NONE SEEN
MICRO NUMBER:: 11215354
SPECIMEN QUALITY:: ADEQUATE
TRICHROME RESULT:: NONE SEEN

## 2020-08-09 LAB — GIARDIA ANTIGEN
MICRO NUMBER:: 11215353
RESULT:: NOT DETECTED
SPECIMEN QUALITY:: ADEQUATE

## 2020-08-09 LAB — SALMONELLA/SHIGELLA CULT, CAMPY EIA AND SHIGA TOXIN RFL ECOLI
MICRO NUMBER: 11215351
MICRO NUMBER:: 11215352
MICRO NUMBER:: 11215355
Result:: NOT DETECTED
SHIGA RESULT:: NOT DETECTED
SPECIMEN QUALITY: ADEQUATE
SPECIMEN QUALITY:: ADEQUATE
SPECIMEN QUALITY:: ADEQUATE

## 2020-08-09 MED ORDER — CYANOCOBALAMIN 1000 MCG/ML IJ SOLN: 1000.0000 ug | Freq: Once | INTRAMUSCULAR | 12 refills | Status: AC

## 2020-08-09 NOTE — Telephone Encounter (Signed)
Resent prescription with correct number of refills.

## 2020-08-10 ENCOUNTER — Telehealth: Payer: Self-pay | Admitting: Gastroenterology

## 2020-08-10 NOTE — Telephone Encounter (Signed)
Pt states her pharmacy has not received the b12 injection medication she needs to bring on Monday 11/29

## 2020-08-10 NOTE — Telephone Encounter (Signed)
I have called and spoke to patient, she said she had just got a call from the pharmacy that her prescription was ready.

## 2020-08-15 ENCOUNTER — Ambulatory Visit (INDEPENDENT_AMBULATORY_CARE_PROVIDER_SITE_OTHER): Payer: PPO | Admitting: Gastroenterology

## 2020-08-15 ENCOUNTER — Encounter: Payer: Self-pay | Admitting: Gastroenterology

## 2020-08-15 ENCOUNTER — Other Ambulatory Visit: Payer: Self-pay

## 2020-08-15 DIAGNOSIS — E538 Deficiency of other specified B group vitamins: Secondary | ICD-10-CM

## 2020-08-15 MED ORDER — CYANOCOBALAMIN 1000 MCG/ML IJ SOLN: 1000.0000 ug | INTRAMUSCULAR | 11 refills | Status: AC

## 2020-08-15 MED ORDER — CYANOCOBALAMIN 1000 MCG/ML IJ SOLN
1000.0000 ug | Freq: Once | INTRAMUSCULAR | Status: AC
  Administered 2020-08-15: 1000 ug via INTRAMUSCULAR

## 2020-09-22 ENCOUNTER — Encounter: Payer: Self-pay | Admitting: Gastroenterology

## 2020-09-22 ENCOUNTER — Other Ambulatory Visit (INDEPENDENT_AMBULATORY_CARE_PROVIDER_SITE_OTHER): Payer: PPO

## 2020-09-22 ENCOUNTER — Ambulatory Visit: Payer: PPO | Admitting: Gastroenterology

## 2020-09-22 ENCOUNTER — Other Ambulatory Visit: Payer: Self-pay

## 2020-09-22 VITALS — BP 134/90 | HR 94 | Ht 68.0 in | Wt 199.1 lb

## 2020-09-22 DIAGNOSIS — R197 Diarrhea, unspecified: Secondary | ICD-10-CM | POA: Diagnosis not present

## 2020-09-22 DIAGNOSIS — R1013 Epigastric pain: Secondary | ICD-10-CM | POA: Diagnosis not present

## 2020-09-22 MED ORDER — DICYCLOMINE HCL 10 MG PO CAPS: 10.0000 mg | ORAL_CAPSULE | Freq: Two times a day (BID) | ORAL | 3 refills | Status: DC

## 2020-09-22 NOTE — Patient Instructions (Addendum)
If you are age 68 or older, your body mass index should be between 23-30. Your Body mass index is 30.28 kg/m. If this is out of the aforementioned range listed, please consider follow up with your Primary Care Provider.  If you are age 78 or younger, your body mass index should be between 19-25. Your Body mass index is 30.28 kg/m. If this is out of the aformentioned range listed, please consider follow up with your Primary Care Provider.   You have been scheduled for an endoscopy. Please follow written instructions given to you at your visit today. If you use inhalers (even only as needed), please bring them with you on the day of your procedure.   You have been scheduled for an abdominal ultrasound at [facility] (1st floor [address] ) on 10/05/20 at 9am. Please arrive 15 minutes prior to your appointment for registration. Make certain not to have anything to eat or drink 6 hours prior to your appointment. Should you need to reschedule your appointment, please contact radiology at [phone]. This test typically takes about 30 minutes to perform.  Please go to the lab on the 2nd floor [address] before you leave the office today.   Stop all Alcohol use  Thank you,  Dr. Lynann Bologna

## 2020-09-22 NOTE — Progress Notes (Signed)
Chief Complaint: For UGI evaluation.  Referring Provider:  Greig Right, MD      ASSESSMENT AND PLAN;   #1. Diarrhea, likely IBS. Nl CBC, CMP, CRP. Neg stool studies. H/O constipation in the past.  #2. Epi pain  #3. H/O diverticulitis s/p sigmoid resection 03/2017.  Neg colon 12/2019  Plan;  - Check CBC, CMP. Lipase. B12, celiac serology - Stool for fecal elastace, GI pathogen, fat.  - Bentyl 10mg  BID. If still problems, trial of Cholestramine - EGD with SB bx. Would give trial of PPIs thereafter if needed. - [place] abdo complete - CT AP if still with problems - Stop collagen, lactobacillus, eye heath complex MVI - Stop ETOH for now.     HPI:    Laura Hahn is a 70 y.o. female  S/P Covid-05 May 2020  For FU  Diarrhea 4-5/day, then occ constipation. Yellow color.  Frothy which floats on the surface.  She had negative stool studies for GI pathogens, minimal elevation of calprotectin.   Not been drinking well water anymore.  No fever or chills.  No nocturnal symptoms.  Lately has been having upper abdo pain with associated nausea but no heartburn.  The pain would get worse after eating.  She denies having any dysphagia or odynophagia.  Would like to get upper endoscopy and further evaluation for gallbladder disease.  No weight loss.   Has been drinking more alcohol than usual-wine/vodka.  3-4 drinks/night.  No stress but mostly homebound due to [place].   Just got a new house (second home) at [facility].  Wt Readings from Last 3 Encounters:  09/22/20 199 lb 2 oz (90.3 kg)  12/31/19 196 lb (88.9 kg)  12/03/19 196 lb 2 oz (89 kg)      Past GI work-up: -CT Abdo/pelvis 12/2016: Sigmoid diverticulitis.  Focal narrowing of the descending colon with associated twisting of mesentery although no obstruction.  Findings may be due to internal hernia versus adhesions.  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 Colonoscopy ([facility]) 12/2019 -Mild pancolonic diverticulosis. -S/P previous sigmoid resection. Patent anastomosis. -Otherwise normal colonoscopy to cecum. The colon was highly redundant.  Past Medical History:  Diagnosis Date  . Arthritis    neck  . COVID-19 virus infection 04/2020   had both shots and got covid. been having diarrhea like 5 times a day. thought it could be some malabsoption  . Diverticulosis   . Elevated cholesterol   . Family history of colon cancer   . Family history of colonic polyps   . History of kidney infection   . Hypertension    states under control with meds., has been on med. x 30 yr.  . IBS (irritable bowel syndrome)   . Mass of finger of left hand 06/2015   small finger  . Osteoarthritis   . PONV (postoperative nausea and vomiting)     Past Surgical History:  Procedure Laterality Date  . ABDOMINAL HYSTERECTOMY     partial   . APPENDECTOMY    . BOWEL RESECTION  03/2017   Dr. Amalia [facility]  . breast lift    . COLONOSCOPY  08/22/2015   Moderate predominantly left diverticulosis. Otherwise normal colonoscopy to terminal ilium. The colon was highly redundant.   Marland Kitchen CYST EXCISION Right    great toe  . FINGER GANGLION CYST EXCISION Right   . KNEE ARTHROSCOPY Right 05/28/2008  . LIPOSUCTION    . MASS EXCISION Left  07/04/2015   Procedure: EXCISION MASS LEFT SMALL FINGER ;  Surgeon: Louisa Second, MD;  Location: [facility];  Service: Plastics;  Laterality: Left;  Marland Kitchen MASTOPEXY  12/2014  . SKIN FULL THICKNESS GRAFT Left 07/04/2015   Procedure: SKIN GRAFT FULL THICKNESS;  Surgeon: Louisa Second, MD;  Location: [facility];  Service: Plastics;  Laterality: Left;  . TONSILLECTOMY    . TOTAL KNEE ARTHROPLASTY Right 05/27/2014   Procedure: TOTAL RIGHT KNEE ARTHROPLASTY;  Surgeon: Javier Docker, MD;  Location: [facility];  Service: Orthopedics;  Laterality: Right;  . TOTAL KNEE ARTHROPLASTY Left 11/20/2017    Procedure: TOTAL LEFT KNEE ARTHROPLASTY;  Surgeon: Jene Every, MD;  Location: [facility];  Service: Orthopedics;  Laterality: Left;  . WEDGE RESECTION     ovary  . WISDOM TOOTH EXTRACTION      Family History  Problem Relation Age of Onset  . Diverticulitis Mother   . Colon polyps Mother   . Pancreatic cancer Cousin        1st cousin  . Colon cancer Maternal Aunt   . Colon cancer Maternal Uncle   . Colon cancer Maternal Great-grandmother   . Pancreatic cancer Cousin   . Esophageal cancer Neg Hx   . Rectal cancer Neg Hx   . Stomach cancer Neg Hx     Social History   Tobacco Use  . Smoking status: Never Smoker  . Smokeless tobacco: Never Used  Vaping Use  . Vaping Use: Never used  Substance Use Topics  . Alcohol use: Yes    Comment: 2 bottles wine/week  . Drug use: No    Current Outpatient Medications  Medication Sig Dispense Refill  . carvedilol (COREG) 6.25 MG tablet 1 tablet 2 (two) times daily.    . meloxicam (MOBIC) 15 MG tablet TAKE 1 TABLET(15 MG) BY MOUTH DAILY (Patient taking differently: Take 15 mg by mouth daily as needed.) 30 tablet 0  . Multiple Vitamin (MULTIVITAMIN WITH MINERALS) TABS tablet Take 1 tablet by mouth daily.    Marland Kitchen omeprazole (PRILOSEC) 20 MG capsule TAKE 1 CAPSULE(20 MG) BY MOUTH DAILY (Patient taking differently: Take 20 mg by mouth as needed.) 30 capsule 0  . cyanocobalamin (,VITAMIN B-12,) 1000 MCG/ML injection Inject 1 mL (1,000 mcg total) into the muscle every 30 (thirty) days. (Patient not taking: Reported on 09/22/2020) 1 mL 11  . Lactobacillus (ACIDOPHILUS PO) Take 1 capsule by mouth daily.      No current facility-administered medications for this visit.    Allergies  Allergen Reactions  . Lisinopril Cough  . Metoprolol Rash    Review of Systems:  Constitutional: Denies fever, chills, diaphoresis, appetite change and has fatigue.  neg    Physical Exam:    BP 134/90   Pulse 94   Ht 5\' 8"  (1.727 m)   Wt 199 lb 2 oz (90.3 kg)    BMI 30.28 kg/m  Wt Readings from Last 3 Encounters:  09/22/20 199 lb 2 oz (90.3 kg)  12/31/19 196 lb (88.9 kg)  12/03/19 196 lb 2 oz (89 kg)   Constitutional:  Well-developed, in no acute distress. Psychiatric: Normal mood and affect. Behavior is normal. HEENT: Pupils normal.  Conjunctivae are normal. No scleral icterus. Neck supple.  Cardiovascular: Normal rate, regular rhythm. No edema Pulmonary/chest: Effort normal and breath sounds normal. No wheezing, rales or rhonchi. Abdominal: Soft, nondistended.  Minimal epigastric tenderness.  Bowel sounds active throughout. There are no masses palpable. No hepatomegaly. Rectal:  defered Neurological: Alert and oriented to person place and time. Skin: Skin is warm and dry. No rashes noted.  Data Reviewed: I have personally reviewed following labs and imaging studies  CBC: CBC Latest Ref Rng & Units 08/02/2020 11/23/2017 11/22/2017  WBC 4.0 - 10.5 K/uL 4.6 8.1 9.0  Hemoglobin 12.0 - 15.0 g/dL 14.2 11.3(L) 12.3  Hematocrit 36.0 - 46.0 % 42.1 33.0(L) 36.6  Platelets 150.0 - 400.0 K/uL 270.0 221 231    CMP: CMP Latest Ref Rng & Units 08/02/2020 11/21/2017 11/12/2017  Glucose 70 - 99 mg/dL 109(H) 128(H) 117(H)  BUN 6 - 23 mg/dL 24(H) 11 23(H)  Creatinine 0.40 - 1.20 mg/dL 0.80 0.79 0.95  Sodium 135 - 145 mEq/L 135 131(L) 137  Potassium 3.5 - 5.1 mEq/L 4.0 4.1 4.1  Chloride 96 - 112 mEq/L 99 97(L) 100(L)  CO2 19 - 32 mEq/L 26 27 25   Calcium 8.4 - 10.5 mg/dL 9.8 9.0 9.9  Total Protein 6.0 - 8.3 g/dL 6.9 - -  Total Bilirubin 0.2 - 1.2 mg/dL 0.5 - -  Alkaline Phos 39 - 117 U/L 68 - -  AST 0 - 37 U/L 21 - -  ALT 0 - 35 U/L 29 - -     Carmell Austria, MD 09/22/2020, 1:47 PM  Cc: Greig Right, MD

## 2020-09-23 ENCOUNTER — Other Ambulatory Visit: Payer: PPO

## 2020-09-23 ENCOUNTER — Encounter: Payer: Self-pay | Admitting: Gastroenterology

## 2020-09-23 DIAGNOSIS — R1013 Epigastric pain: Secondary | ICD-10-CM | POA: Diagnosis not present

## 2020-09-23 DIAGNOSIS — R197 Diarrhea, unspecified: Secondary | ICD-10-CM

## 2020-09-23 LAB — CBC WITH DIFFERENTIAL/PLATELET
Basophils Absolute: 0.1 10*3/uL (ref 0.0–0.1)
Basophils Relative: 1.1 % (ref 0.0–3.0)
Eosinophils Absolute: 0.1 10*3/uL (ref 0.0–0.7)
Eosinophils Relative: 2.4 % (ref 0.0–5.0)
HCT: 41.6 % (ref 36.0–46.0)
Hemoglobin: 14.3 g/dL (ref 12.0–15.0)
Lymphocytes Relative: 38.4 % (ref 12.0–46.0)
Lymphs Abs: 2.1 10*3/uL (ref 0.7–4.0)
MCHC: 34.3 g/dL (ref 30.0–36.0)
MCV: 104 fl — ABNORMAL HIGH (ref 78.0–100.0)
Monocytes Absolute: 0.5 10*3/uL (ref 0.1–1.0)
Monocytes Relative: 8.2 % (ref 3.0–12.0)
Neutro Abs: 2.7 10*3/uL (ref 1.4–7.7)
Neutrophils Relative %: 49.9 % (ref 43.0–77.0)
Platelets: 244 10*3/uL (ref 150.0–400.0)
RBC: 4 Mil/uL (ref 3.87–5.11)
RDW: 14.5 % (ref 11.5–15.5)
WBC: 5.5 10*3/uL (ref 4.0–10.5)

## 2020-09-23 LAB — COMPREHENSIVE METABOLIC PANEL
ALT: 14 U/L (ref 0–35)
AST: 14 U/L (ref 0–37)
Albumin: 4.6 g/dL (ref 3.5–5.2)
Alkaline Phosphatase: 46 U/L (ref 39–117)
BUN: 25 mg/dL — ABNORMAL HIGH (ref 6–23)
CO2: 27 mEq/L (ref 19–32)
Calcium: 10.2 mg/dL (ref 8.4–10.5)
Chloride: 101 mEq/L (ref 96–112)
Creatinine, Ser: 0.86 mg/dL (ref 0.40–1.20)
GFR: 69.86 mL/min (ref >60.00)
Glucose, Bld: 94 mg/dL (ref 70–99)
Potassium: 4.1 mEq/L (ref 3.5–5.1)
Sodium: 139 mEq/L (ref 135–145)
Total Bilirubin: 0.6 mg/dL (ref 0.2–1.2)
Total Protein: 7 g/dL (ref 6.0–8.3)

## 2020-09-23 LAB — LIPASE: Lipase: 29 U/L (ref 11.0–59.0)

## 2020-09-23 LAB — VITAMIN B12: Vitamin B-12: 591 pg/mL (ref 211–911)

## 2020-09-24 LAB — CELIAC PANEL 10
Antigliadin Abs, IgA: 2 units (ref 0–19)
Endomysial IgA: NEGATIVE
Gliadin IgG: 1 units (ref 0–19)
IgA/Immunoglobulin A, Serum: 112 mg/dL (ref 87–352)
Tissue Transglut Ab: <2 U/mL (ref 0–5)
Transglutaminase IgA: <2 U/mL (ref 0–3)

## 2020-09-25 LAB — GI PROFILE, STOOL, PCR

## 2020-09-26 ENCOUNTER — Ambulatory Visit (AMBULATORY_SURGERY_CENTER): Payer: PPO | Admitting: Gastroenterology

## 2020-09-26 ENCOUNTER — Other Ambulatory Visit: Payer: Self-pay

## 2020-09-26 ENCOUNTER — Encounter: Payer: Self-pay | Admitting: Gastroenterology

## 2020-09-26 VITALS — BP 131/78 | HR 71 | Temp 97.3°F | Resp 14 | Ht 68.0 in | Wt 199.0 lb

## 2020-09-26 DIAGNOSIS — K219 Gastro-esophageal reflux disease without esophagitis: Secondary | ICD-10-CM | POA: Diagnosis not present

## 2020-09-26 DIAGNOSIS — I10 Essential (primary) hypertension: Secondary | ICD-10-CM | POA: Diagnosis not present

## 2020-09-26 DIAGNOSIS — R1013 Epigastric pain: Secondary | ICD-10-CM | POA: Diagnosis not present

## 2020-09-26 DIAGNOSIS — K449 Diaphragmatic hernia without obstruction or gangrene: Secondary | ICD-10-CM | POA: Diagnosis not present

## 2020-09-26 DIAGNOSIS — R197 Diarrhea, unspecified: Secondary | ICD-10-CM | POA: Diagnosis not present

## 2020-09-26 DIAGNOSIS — R109 Unspecified abdominal pain: Secondary | ICD-10-CM

## 2020-09-26 DIAGNOSIS — K2970 Gastritis, unspecified, without bleeding: Secondary | ICD-10-CM

## 2020-09-26 MED ORDER — PANTOPRAZOLE SODIUM 20 MG PO TBEC: 20.0000 mg | DELAYED_RELEASE_TABLET | Freq: Every day | ORAL | 11 refills | Status: DC

## 2020-09-26 MED ORDER — SODIUM CHLORIDE 0.9 % IV SOLN
500.0000 mL | Freq: Once | INTRAVENOUS | Status: DC
Start: 2020-09-26 — End: 2020-09-26

## 2020-09-26 NOTE — Progress Notes (Signed)
Called to room to assist during endoscopic procedure.  Patient ID and intended procedure confirmed with present staff. Received instructions for my participation in the procedure from the performing physician.  

## 2020-09-26 NOTE — Progress Notes (Signed)
Report to PACU, RN, vss, BBS= Clear.  

## 2020-09-26 NOTE — Op Note (Signed)
[place] Patient Name: Laura Hahn Procedure Date: 09/26/2020 11:15 AM MRN: [id number] Endoscopist: Jackquline Denmark , MD Age: 70 Referring MD:  Date of Birth: Jun 08, 1951 Gender: Female Account #: [id number] Procedure:                Upper GI endoscopy Indications:              Epigastric abdominal pain Medicines:                Monitored Anesthesia Care Procedure:                Pre-Anesthesia Assessment:                           - Prior to the procedure, a History and Physical                            was performed, and patient medications and                            allergies were reviewed. The patient's tolerance of                            previous anesthesia was also reviewed. The risks                            and benefits of the procedure and the sedation                            options and risks were discussed with the patient.                            All questions were answered, and informed consent                            was obtained. Prior Anticoagulants: The patient has                            taken no previous anticoagulant or antiplatelet                            agents. ASA Grade Assessment: II - A patient with                            mild systemic disease. After reviewing the risks                            and benefits, the patient was deemed in                            satisfactory condition to undergo the procedure.                           After obtaining informed consent, the endoscope was  passed under direct vision. Throughout the                            procedure, the patient's blood pressure, pulse, and                            oxygen saturations were monitored continuously. The                            Endoscope was introduced through the mouth, and                            advanced to the second part of duodenum. The upper                            GI endoscopy was accomplished  without difficulty.                            The patient tolerated the procedure well. Scope In: Scope Out: Findings:                 The examined esophagus was normal with well-defined                            Z-line at 35 cm. Examined by NBI. Incidental note                            was made of 2 small inlet patches in the proximal                            esophagus.                           A small hiatal hernia was present.                           Diffuse mild inflammation characterized by erythema                            was found in the gastric antrum. Biopsies were                            taken with a cold forceps for histology.                           The examined duodenum was normal. Biopsies for                            histology were taken with a cold forceps for                            evaluation of celiac disease. Complications:            No immediate complications. Estimated Blood Loss:     Estimated blood loss: none. Impression:               -  Small hiatal hernia.                           - Mild gastritis. Recommendation:           - Patient has a contact number available for                            emergencies. The signs and symptoms of potential                            delayed complications were discussed with the                            patient. Return to normal activities tomorrow.                            Written discharge instructions were provided to the                            patient.                           - Resume previous diet.                           - Use Protonix (pantoprazole) 20 mg PO daily #30,                            11 refills                           - Stop all ETOH.                           - Await pathology results.                           - The findings and recommendations were discussed                            with the patient's family. If still with problems,                            we  will perform further work-up. Jackquline Denmark, MD 09/26/2020 11:35:03 AM This report has been signed electronically.

## 2020-09-26 NOTE — Patient Instructions (Signed)
Start using protonix(pantoprazole) 20mg  daily with water only and at least 30 min before eating Stop all alcohol Resume previous diet  await pathology results  YOU HAD AN ENDOSCOPIC PROCEDURE TODAY AT [facility]:   Refer to the procedure report that was given to you for any specific questions about what was found during the examination.  If the procedure report does not answer your questions, please call your gastroenterologist to clarify.  If you requested that your care partner not be given the details of your procedure findings, then the procedure report has been included in a sealed envelope for you to review at your convenience later.  YOU SHOULD EXPECT: Some feelings of bloating in the abdomen. Passage of more gas than usual.  Walking can help get rid of the air that was put into your GI tract during the procedure and reduce the bloating. If you had a lower endoscopy (such as a colonoscopy or flexible sigmoidoscopy) you may notice spotting of blood in your stool or on the toilet paper. If you underwent a bowel prep for your procedure, you may not have a normal bowel movement for a few days.  Please Note:  You might notice some irritation and congestion in your nose or some drainage.  This is from the oxygen used during your procedure.  There is no need for concern and it should clear up in a day or so.  SYMPTOMS TO REPORT IMMEDIATELY:   Following upper endoscopy (EGD)  Vomiting of blood or coffee ground material  New chest pain or pain under the shoulder blades  Painful or persistently difficult swallowing  New shortness of breath  Fever of 100F or higher  Black, tarry-looking stools  For urgent or emergent issues, a gastroenterologist can be reached at any hour by calling [phone]. Do not use MyChart messaging for urgent concerns.   IET:  We do recommend a small meal at first, but then you may proceed to your regular diet.  Drink plenty of fluids but you  should avoid alcoholic beverages for 24 hours.  ACTIVITY:  You should plan to take it easy for the rest of today and you should NOT DRIVE or use heavy machinery until tomorrow (because of the sedation medicines used during the test).    FOLLOW UP: Our staff will call the number listed on your records 48-72 hours following your procedure to check on you and address any questions or concerns that you may have regarding the information given to you following your procedure. If we do not reach you, we will leave a message.  We will attempt to reach you two times.  During this call, we will ask if you have developed any symptoms of COVID 19. If you develop any symptoms (ie: fever, flu-like symptoms, shortness of breath, cough etc.) before then, please call [phone].  If you test positive for Covid 19 in the 2 weeks post procedure, please call and report this information to [place].    If any biopsies were taken you will be contacted by phone or by letter within the next 1-3 weeks.  Please call us at [phone] if you have not heard about the biopsies in 3 weeks.   SIGNATURES/CONFIDENTIALITY: You and/or your care partner have signed paperwork which will be entered into your electronic medical record.  These signatures attest to the fact that that the information above on your After Visit Summary has been reviewed and is understood.  Full responsibility of the confidentiality  of this discharge information lies with you and/or your care-partner.

## 2020-09-26 NOTE — Addendum Note (Signed)
Addended by: Lanier Prude A on: 09/26/2020 03:12 PM   Modules accepted: Orders

## 2020-09-26 NOTE — Progress Notes (Signed)
Vs by CW in adm 

## 2020-09-28 ENCOUNTER — Telehealth: Payer: Self-pay | Admitting: *Deleted

## 2020-09-28 LAB — PANCREATIC ELASTASE, FECAL: Pancreatic Elastase-1, Stool: 455 mcg/g

## 2020-09-28 LAB — FECAL FAT, QUALITATIVE: FECAL FAT, QUALITATIVE: ABNORMAL — AB

## 2020-09-28 NOTE — Telephone Encounter (Signed)
  Follow up Call-  Call back number 09/26/2020 12/31/2019  Post procedure Call Back phone  # [phone]  Permission to leave phone message Yes Yes  Some recent data might be hidden     Patient questions:  Do you have a fever, pain , or abdominal swelling? No. Pain Score  0 *  Have you tolerated food without any problems? Yes.    Have you been able to return to your normal activities? Yes.    Do you have any questions about your discharge instructions: Diet   No. Medications  No. Follow up visit  No.  Do you have questions or concerns about your Care? No.  Actions: * If pain score is 4 or above: No action needed, pain <4.  1. Have you developed a fever since your procedure? NO  2.   Have you had an respiratory symptoms (SOB or cough) since your procedure? NO  3.   Have you tested positive for COVID 19 since your procedure NO  4.   Have you had any family members/close contacts diagnosed with the COVID 19 since your procedure?  NO  If yes to any of these questions please route to Joylene John, RN and Joella Prince, RN

## 2020-10-03 ENCOUNTER — Ambulatory Visit: Payer: PPO | Admitting: Podiatry

## 2020-10-03 ENCOUNTER — Encounter: Payer: Self-pay | Admitting: Gastroenterology

## 2020-10-05 ENCOUNTER — Other Ambulatory Visit: Payer: Self-pay

## 2020-10-05 ENCOUNTER — Ambulatory Visit (HOSPITAL_BASED_OUTPATIENT_CLINIC_OR_DEPARTMENT_OTHER)
Admission: RE | Admit: 2020-10-05 | Discharge: 2020-10-05 | Disposition: A | Discharge status: Home / Self Care | Payer: PPO | Source: Ambulatory Visit | Attending: Gastroenterology | Admitting: Gastroenterology

## 2020-10-05 DIAGNOSIS — R1013 Epigastric pain: Secondary | ICD-10-CM | POA: Diagnosis not present

## 2020-10-05 DIAGNOSIS — R197 Diarrhea, unspecified: Secondary | ICD-10-CM | POA: Diagnosis not present

## 2020-10-05 DIAGNOSIS — N1330 Unspecified hydronephrosis: Secondary | ICD-10-CM | POA: Diagnosis not present

## 2020-10-05 DIAGNOSIS — N200 Calculus of kidney: Secondary | ICD-10-CM | POA: Diagnosis not present

## 2020-10-13 ENCOUNTER — Encounter: Payer: Self-pay | Admitting: Podiatry

## 2020-10-13 ENCOUNTER — Other Ambulatory Visit: Payer: Self-pay

## 2020-10-13 ENCOUNTER — Ambulatory Visit: Payer: PPO | Admitting: Podiatry

## 2020-10-13 DIAGNOSIS — M7731 Calcaneal spur, right foot: Secondary | ICD-10-CM | POA: Diagnosis not present

## 2020-10-13 DIAGNOSIS — M7732 Calcaneal spur, left foot: Secondary | ICD-10-CM

## 2020-10-13 DIAGNOSIS — M216X9 Other acquired deformities of unspecified foot: Secondary | ICD-10-CM

## 2020-10-13 DIAGNOSIS — M7661 Achilles tendinitis, right leg: Secondary | ICD-10-CM

## 2020-10-13 DIAGNOSIS — M7662 Achilles tendinitis, left leg: Secondary | ICD-10-CM

## 2020-10-13 MED ORDER — METHYLPREDNISOLONE 4 MG PO TBPK: ORAL_TABLET | ORAL | 0 refills | Status: DC

## 2020-10-13 NOTE — Progress Notes (Signed)
  Subjective:  Patient ID: Laura Hahn, female    DOB: Feb 03, 1951,  MRN: [id number]  Chief Complaint  Patient presents with  . Plantar Fasciitis    I am still having some pain on the right which is worse and they are stiff when I get out of the car and hurts to walk    70 y.o. female presents with the above complaint. History confirmed with patient. States the steroid pack did help but she got Covid shortly after which also limited her ability to focus on her heel. States the right is the worst one. Still having a lot of pain and inflammation in the right heel.  Objective:  Physical Exam: warm, good capillary refill, no trophic changes or ulcerative lesions, normal DP and PT pulses and normal sensory exam. Left Foot: POP Achilles with prominent Haglund deformity, no dell, good Achilles strength. +Silverskiold test. Decreased AJ ROM. Right Foot: POP Achilles with prominent Haglund deformity, no dell, good Achilles strength. +Silverskiold test. Decreased AJ ROM.  No images are attached to the encounter. Assessment:   1. Achilles tendinitis of both lower extremities   2. Equinus deformity of foot   3. Calcaneal spur of left foot   4. Calcaneal spur of right foot    Plan:  Patient was evaluated and treated and all questions answered.  Achilles Tendonitis -Lengthy discussion about options - we did discuss repeat PT, prednisone, possible surgery. At this point patient would like to try a more minimally invasive options. Trial repeat Medrol pack, aggressive stretching -Should issues persist consider Gastrocnemius recession prior to full Haglund's resection for easier post-op course.  Return in about 6 weeks (around 11/24/2020).

## 2020-10-13 NOTE — Patient Instructions (Signed)
Recommend on [employer]:  Physiological scientist for Plantar Fasciitis, Achilles Tendonitis and Tight Calves

## 2020-10-15 NOTE — Progress Notes (Signed)
Please inform the patient. [place] - neg It does show severe right hydronephrosis which is unchanged from the previous CT. She does not have any urinary symptoms Send report to Dr. Burnett Sheng.

## 2020-10-18 ENCOUNTER — Telehealth: Payer: Self-pay | Admitting: Gastroenterology

## 2020-10-18 NOTE — Telephone Encounter (Signed)
Patient notified of [place] results and voiced understanding

## 2020-10-20 DIAGNOSIS — H04123 Dry eye syndrome of bilateral lacrimal glands: Secondary | ICD-10-CM | POA: Diagnosis not present

## 2020-12-15 ENCOUNTER — Other Ambulatory Visit: Payer: Self-pay

## 2020-12-15 ENCOUNTER — Ambulatory Visit: Payer: PPO | Admitting: Podiatry

## 2020-12-15 ENCOUNTER — Encounter: Payer: Self-pay | Admitting: Podiatry

## 2020-12-15 DIAGNOSIS — M2170 Unequal limb length (acquired), unspecified site: Secondary | ICD-10-CM

## 2020-12-15 DIAGNOSIS — M216X9 Other acquired deformities of unspecified foot: Secondary | ICD-10-CM | POA: Diagnosis not present

## 2020-12-15 DIAGNOSIS — M7731 Calcaneal spur, right foot: Secondary | ICD-10-CM

## 2020-12-15 DIAGNOSIS — M7662 Achilles tendinitis, left leg: Secondary | ICD-10-CM

## 2020-12-15 DIAGNOSIS — M7732 Calcaneal spur, left foot: Secondary | ICD-10-CM

## 2020-12-15 DIAGNOSIS — M7661 Achilles tendinitis, right leg: Secondary | ICD-10-CM | POA: Diagnosis not present

## 2020-12-15 MED ORDER — MELOXICAM 15 MG PO TABS: 15.0000 mg | ORAL_TABLET | Freq: Every day | ORAL | 2 refills | Status: DC

## 2020-12-15 NOTE — Progress Notes (Signed)
  Subjective:  Patient ID: Laura Hahn, female    DOB: 15-Jan-1951,  MRN: [id number]  Chief Complaint  Patient presents with  . Foot Problem    I was having some pain on the right achilles tendon and it is better, moderate pain and I think I need a shot and my sister had one and was better    70 y.o. female presents with the above complaint. History confirmed with patient. States the foot was feeling better but recently flared up. States 2 weeks ago the pain was so bad and she thought she tore her tendon, but it is now improved. Has night splint, slant board, prostretch and states they help when she uses them but her foot hurt too much to do so recently.  Objective:  Physical Exam: warm, good capillary refill, no trophic changes or ulcerative lesions, normal DP and PT pulses and normal sensory exam. Left Foot: POP Achilles with prominent Haglund deformity, no dell, good Achilles strength. +Silverskiold test. Decreased AJ ROM. Right Foot: POP Achilles with prominent Haglund deformity, no dell, good Achilles strength. +Silverskiold test. Decreased AJ ROM. +Allis test right  No images are attached to the encounter. Assessment:   1. Achilles tendinitis of both lower extremities   2. Equinus deformity of foot   3. Calcaneal spur of left foot   4. Calcaneal spur of right foot   5. Acquired unequal limb length    Plan:  Patient was evaluated and treated and all questions answered.  Achilles Tendonitis -Again lengthy discussion regarding treatments -Rx Meloxicam -Heel pad right heel for LLD -Continue stretching, icing, use of ProStretch and Night Splint -Consider surgery should pain persist.  Total time for visit both face-to-face and non face-to-face including patient care, review of chart/imaging, documentation: 35 mins    Return in about 6 weeks (around 01/26/2021) for Tendonitis, Right.

## 2021-01-02 DIAGNOSIS — E669 Obesity, unspecified: Secondary | ICD-10-CM | POA: Diagnosis not present

## 2021-01-02 DIAGNOSIS — Z6830 Body mass index (BMI) 30.0-30.9, adult: Secondary | ICD-10-CM | POA: Diagnosis not present

## 2021-01-02 DIAGNOSIS — I10 Essential (primary) hypertension: Secondary | ICD-10-CM | POA: Diagnosis not present

## 2021-01-10 DIAGNOSIS — L570 Actinic keratosis: Secondary | ICD-10-CM | POA: Diagnosis not present

## 2021-01-10 DIAGNOSIS — D225 Melanocytic nevi of trunk: Secondary | ICD-10-CM | POA: Diagnosis not present

## 2021-01-10 DIAGNOSIS — Z85828 Personal history of other malignant neoplasm of skin: Secondary | ICD-10-CM | POA: Diagnosis not present

## 2021-01-10 DIAGNOSIS — B078 Other viral warts: Secondary | ICD-10-CM | POA: Diagnosis not present

## 2021-01-10 DIAGNOSIS — D1801 Hemangioma of skin and subcutaneous tissue: Secondary | ICD-10-CM | POA: Diagnosis not present

## 2021-01-10 DIAGNOSIS — L821 Other seborrheic keratosis: Secondary | ICD-10-CM | POA: Diagnosis not present

## 2021-01-10 DIAGNOSIS — L814 Other melanin hyperpigmentation: Secondary | ICD-10-CM | POA: Diagnosis not present

## 2021-01-26 ENCOUNTER — Ambulatory Visit: Payer: PPO | Admitting: Podiatry

## 2021-02-01 DIAGNOSIS — H524 Presbyopia: Secondary | ICD-10-CM | POA: Diagnosis not present

## 2021-03-15 DIAGNOSIS — Z01419 Encounter for gynecological examination (general) (routine) without abnormal findings: Secondary | ICD-10-CM | POA: Diagnosis not present

## 2021-03-15 DIAGNOSIS — Z1231 Encounter for screening mammogram for malignant neoplasm of breast: Secondary | ICD-10-CM | POA: Diagnosis not present

## 2021-03-22 ENCOUNTER — Other Ambulatory Visit: Payer: Self-pay | Admitting: Obstetrics and Gynecology

## 2021-03-22 DIAGNOSIS — E2839 Other primary ovarian failure: Secondary | ICD-10-CM

## 2021-04-05 DIAGNOSIS — Z0000 Encounter for general adult medical examination without abnormal findings: Secondary | ICD-10-CM | POA: Diagnosis not present

## 2021-04-05 DIAGNOSIS — I10 Essential (primary) hypertension: Secondary | ICD-10-CM | POA: Diagnosis not present

## 2021-04-05 DIAGNOSIS — Z6829 Body mass index (BMI) 29.0-29.9, adult: Secondary | ICD-10-CM | POA: Diagnosis not present

## 2021-04-05 DIAGNOSIS — R7309 Other abnormal glucose: Secondary | ICD-10-CM | POA: Diagnosis not present

## 2021-04-17 DIAGNOSIS — H04123 Dry eye syndrome of bilateral lacrimal glands: Secondary | ICD-10-CM | POA: Diagnosis not present

## 2021-04-25 ENCOUNTER — Other Ambulatory Visit: Payer: Self-pay | Admitting: Podiatry

## 2021-04-25 NOTE — Telephone Encounter (Signed)
Please Advise

## 2021-05-29 DIAGNOSIS — H04123 Dry eye syndrome of bilateral lacrimal glands: Secondary | ICD-10-CM | POA: Diagnosis not present

## 2021-05-31 DIAGNOSIS — M8589 Other specified disorders of bone density and structure, multiple sites: Secondary | ICD-10-CM | POA: Diagnosis not present

## 2021-05-31 DIAGNOSIS — Z13820 Encounter for screening for osteoporosis: Secondary | ICD-10-CM | POA: Diagnosis not present

## 2021-06-25 ENCOUNTER — Other Ambulatory Visit: Payer: Self-pay | Admitting: Gastroenterology

## 2021-06-30 DIAGNOSIS — Z6828 Body mass index (BMI) 28.0-28.9, adult: Secondary | ICD-10-CM | POA: Diagnosis not present

## 2021-06-30 DIAGNOSIS — I10 Essential (primary) hypertension: Secondary | ICD-10-CM | POA: Diagnosis not present

## 2021-06-30 DIAGNOSIS — J22 Unspecified acute lower respiratory infection: Secondary | ICD-10-CM | POA: Diagnosis not present

## 2021-06-30 DIAGNOSIS — B349 Viral infection, unspecified: Secondary | ICD-10-CM | POA: Diagnosis not present

## 2021-06-30 DIAGNOSIS — R062 Wheezing: Secondary | ICD-10-CM | POA: Diagnosis not present

## 2021-06-30 DIAGNOSIS — R0981 Nasal congestion: Secondary | ICD-10-CM | POA: Diagnosis not present

## 2021-06-30 DIAGNOSIS — R059 Cough, unspecified: Secondary | ICD-10-CM | POA: Diagnosis not present

## 2021-06-30 DIAGNOSIS — R5381 Other malaise: Secondary | ICD-10-CM | POA: Diagnosis not present

## 2021-06-30 DIAGNOSIS — Z20822 Contact with and (suspected) exposure to covid-19: Secondary | ICD-10-CM | POA: Diagnosis not present

## 2021-06-30 DIAGNOSIS — R0789 Other chest pain: Secondary | ICD-10-CM | POA: Diagnosis not present

## 2021-06-30 DIAGNOSIS — R69 Illness, unspecified: Secondary | ICD-10-CM | POA: Diagnosis not present

## 2021-06-30 DIAGNOSIS — R52 Pain, unspecified: Secondary | ICD-10-CM | POA: Diagnosis not present

## 2021-07-17 DIAGNOSIS — L821 Other seborrheic keratosis: Secondary | ICD-10-CM | POA: Diagnosis not present

## 2021-07-17 DIAGNOSIS — L570 Actinic keratosis: Secondary | ICD-10-CM | POA: Diagnosis not present

## 2021-07-17 DIAGNOSIS — D045 Carcinoma in situ of skin of trunk: Secondary | ICD-10-CM | POA: Diagnosis not present

## 2021-07-17 DIAGNOSIS — Z85828 Personal history of other malignant neoplasm of skin: Secondary | ICD-10-CM | POA: Diagnosis not present

## 2021-07-17 DIAGNOSIS — D485 Neoplasm of uncertain behavior of skin: Secondary | ICD-10-CM | POA: Diagnosis not present

## 2021-07-17 DIAGNOSIS — L814 Other melanin hyperpigmentation: Secondary | ICD-10-CM | POA: Diagnosis not present

## 2021-08-02 DIAGNOSIS — Z23 Encounter for immunization: Secondary | ICD-10-CM | POA: Diagnosis not present

## 2021-08-28 DIAGNOSIS — H353111 Nonexudative age-related macular degeneration, right eye, early dry stage: Secondary | ICD-10-CM | POA: Diagnosis not present

## 2021-08-28 DIAGNOSIS — H02054 Trichiasis without entropian left upper eyelid: Secondary | ICD-10-CM | POA: Diagnosis not present

## 2021-08-28 DIAGNOSIS — H26492 Other secondary cataract, left eye: Secondary | ICD-10-CM | POA: Diagnosis not present

## 2021-08-28 DIAGNOSIS — H04129 Dry eye syndrome of unspecified lacrimal gland: Secondary | ICD-10-CM | POA: Diagnosis not present

## 2021-10-06 DIAGNOSIS — E7800 Pure hypercholesterolemia, unspecified: Secondary | ICD-10-CM | POA: Diagnosis not present

## 2021-10-06 DIAGNOSIS — E669 Obesity, unspecified: Secondary | ICD-10-CM | POA: Diagnosis not present

## 2021-10-06 DIAGNOSIS — I10 Essential (primary) hypertension: Secondary | ICD-10-CM | POA: Diagnosis not present

## 2021-10-06 DIAGNOSIS — Z6830 Body mass index (BMI) 30.0-30.9, adult: Secondary | ICD-10-CM | POA: Diagnosis not present

## 2021-10-19 IMAGING — US US ABDOMEN COMPLETE
1 series · 14 of 25 positions shown · non-contrast
Comparison: CT abdomen and pelvis 01/01/2017

CLINICAL DATA: Epigastric pain and diarrhea, history hypertension,
irritable bowel syndrome

EXAM:
ABDOMEN ULTRASOUND COMPLETE

[Series 1: us abdomen complete · 14 of 70 slices shown]
[im 1/70]
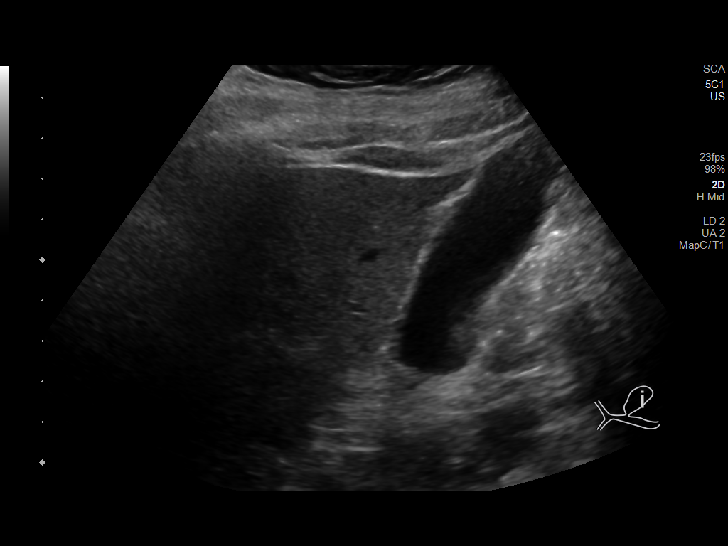
[im 6/70]
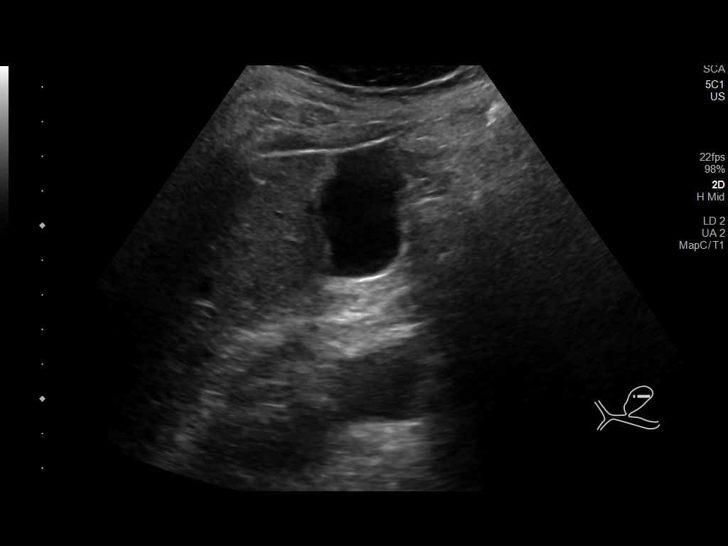
[im 12/70]
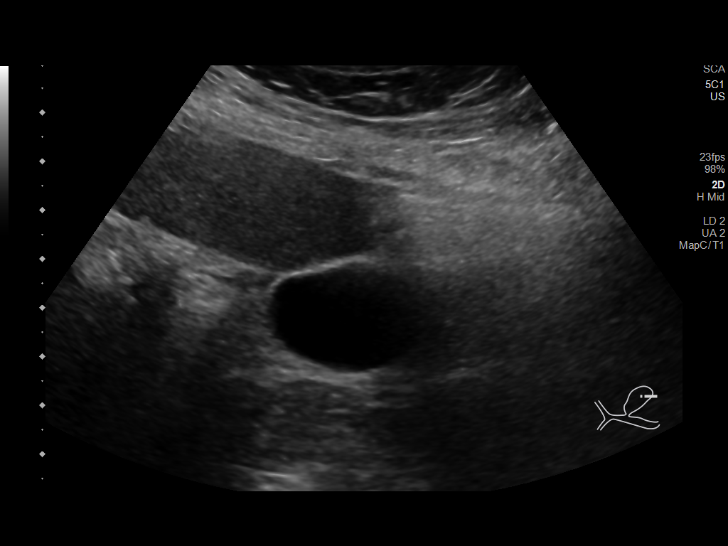
[im 18/70]
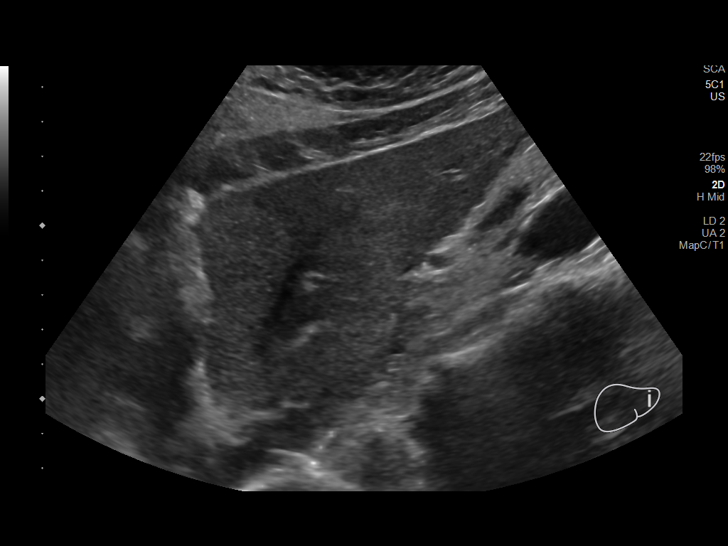
[im 24/70]
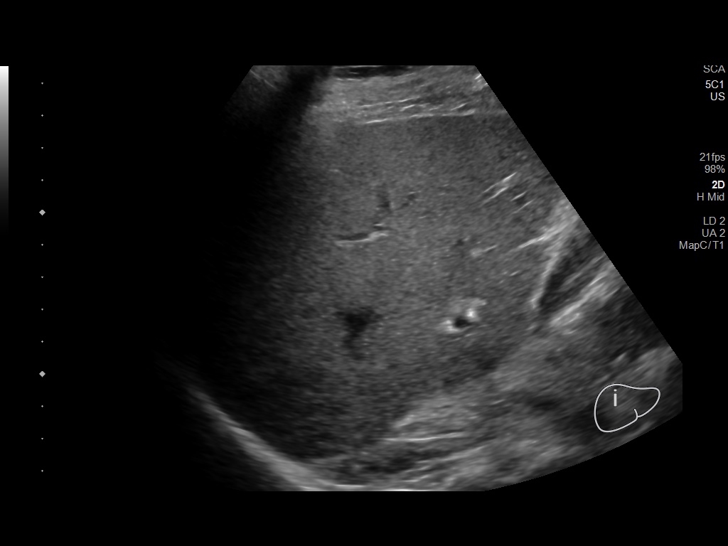
[im 26/70]
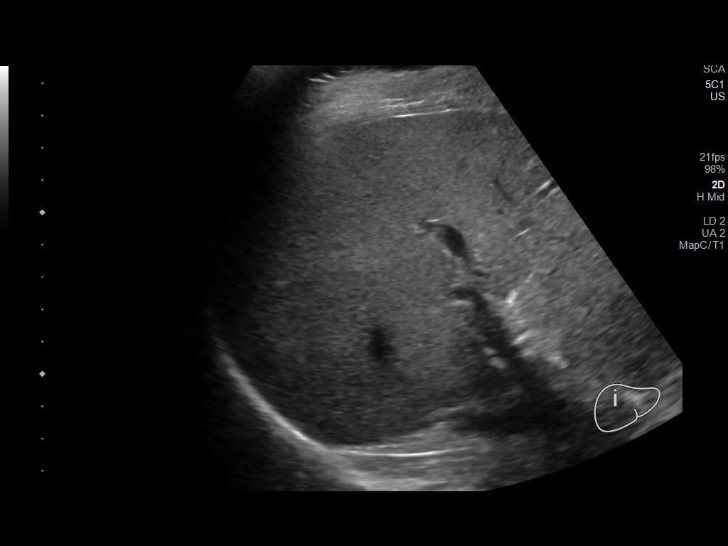
[im 32/70]
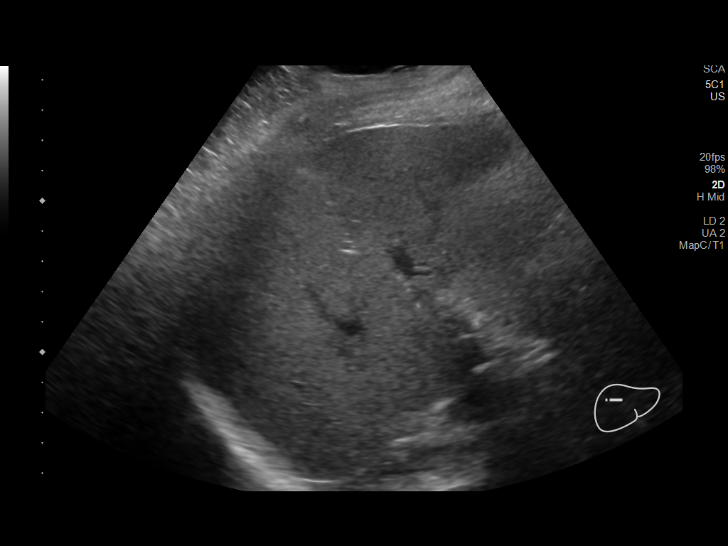
[im 38/70]
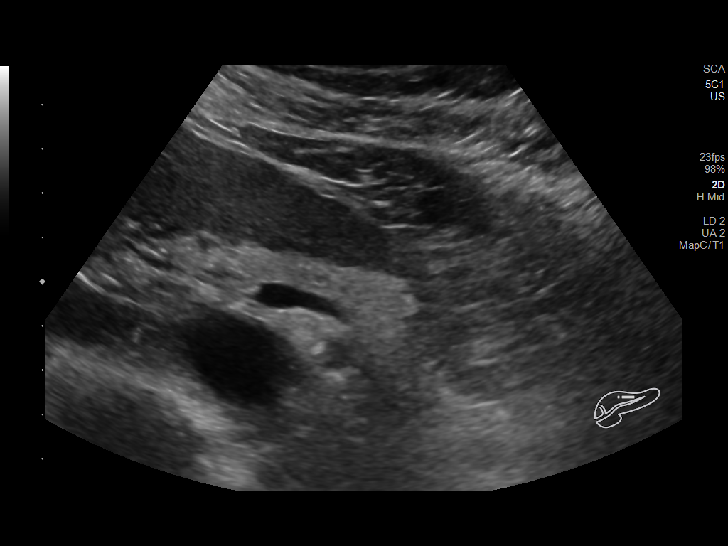
[im 44/70]
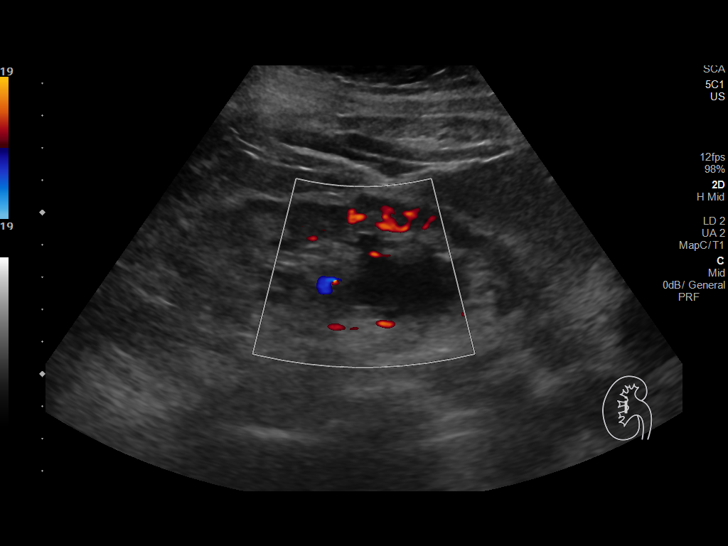
[im 47/70]
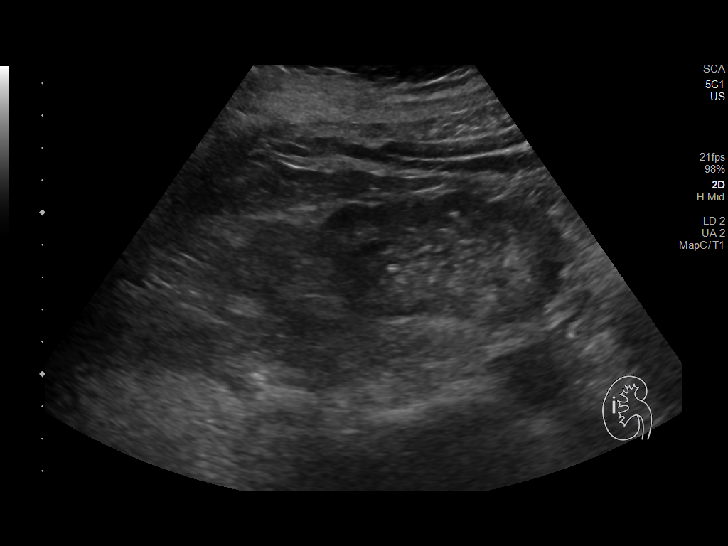
[im 52/70]
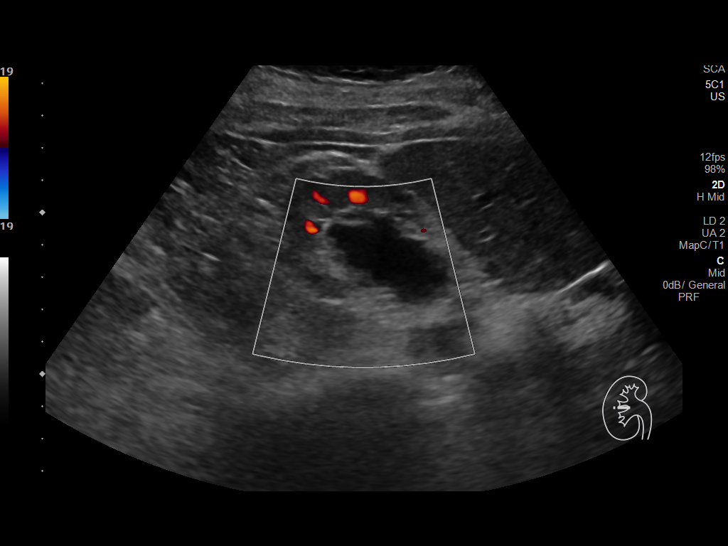
[im 58/70]
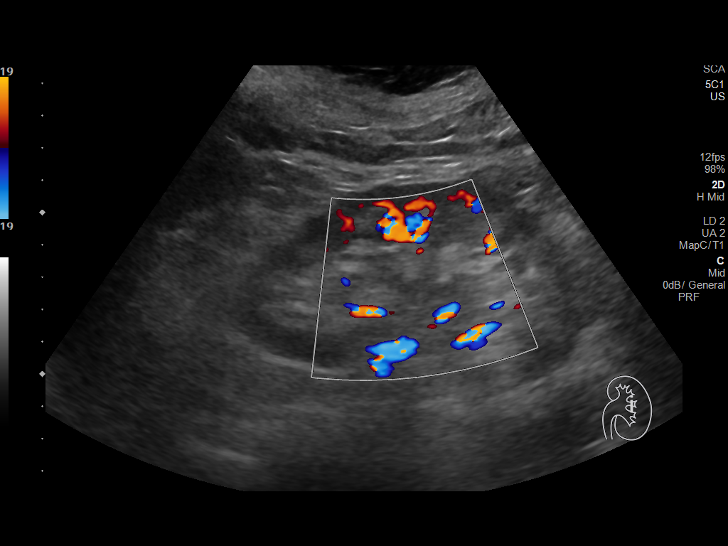
[im 64/70]
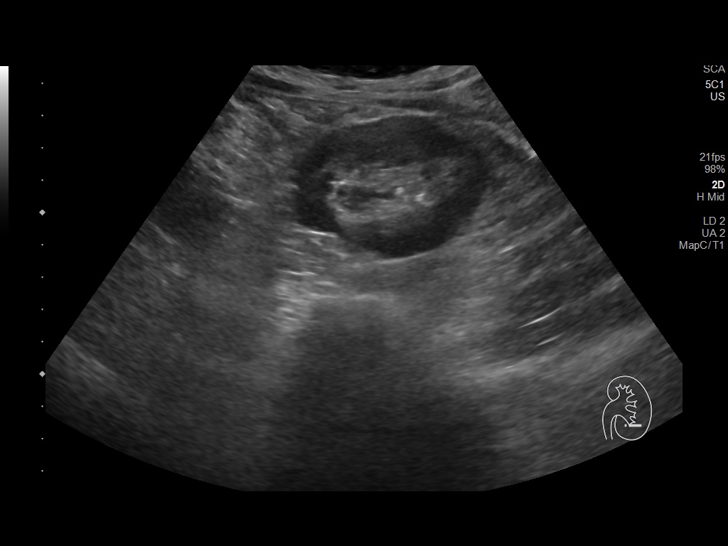
[im 70/70]
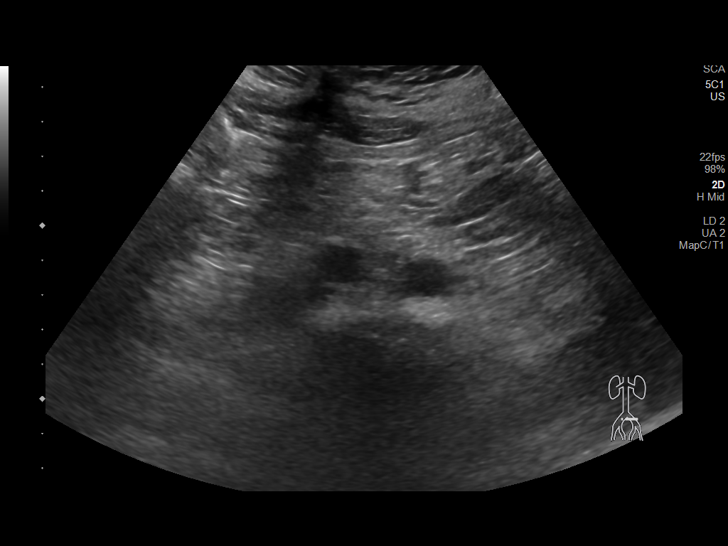

[14 of 25 positions shown; findings below may reference images not displayed]

FINDINGS: Gallbladder: Normally distended without stones or wall thickening.
No pericholecystic fluid or sonographic Murphy sign.

Common bile duct: Diameter: 3 mm, normal

Liver: Normal echogenicity without mass or nodularity. No
intrahepatic biliary dilatation. Portal vein is patent on color
Doppler imaging with normal direction of blood flow towards the
liver.

IVC: Normal appearance

Pancreas: Normal appearance

Spleen: Normal appearance, 3.8 cm length

Right Kidney: Length: 12.3 cm. Cortical thinning. Normal cortical
echogenicity. Severe RIGHT hydronephrosis little changed from prior
CT. No renal mass.

Left Kidney: Length: 11.5 cm. 9 mm shadowing echogenic focus mid
LEFT kidney question nonobstructing calculus. No mass or
hydronephrosis.

Abdominal aorta: Normal caliber

Other findings: No free fluid
IMPRESSION: Severe RIGHT hydronephrosis unchanged since 01/01/2017.

Suspected 9 mm nonobstructing LEFT renal calculus.

No other abdominal sonographic abnormalities identified.

## 2021-11-06 ENCOUNTER — Ambulatory Visit: Payer: PPO | Admitting: Podiatry

## 2021-11-06 ENCOUNTER — Encounter: Payer: Self-pay | Admitting: Podiatry

## 2021-11-06 DIAGNOSIS — M7661 Achilles tendinitis, right leg: Secondary | ICD-10-CM | POA: Diagnosis not present

## 2021-11-06 DIAGNOSIS — M2170 Unequal limb length (acquired), unspecified site: Secondary | ICD-10-CM

## 2021-11-06 DIAGNOSIS — M216X9 Other acquired deformities of unspecified foot: Secondary | ICD-10-CM | POA: Diagnosis not present

## 2021-11-06 DIAGNOSIS — M7662 Achilles tendinitis, left leg: Secondary | ICD-10-CM

## 2021-11-06 MED ORDER — MELOXICAM 15 MG PO TABS: ORAL_TABLET | ORAL | 2 refills | Status: DC

## 2021-11-06 NOTE — Progress Notes (Signed)
°  Subjective:  Patient ID: Laura Hahn, female    DOB: 1950-09-18,  MRN: [id number]  Chief Complaint  Patient presents with   Foot Pain    I have been better and my feet will not stop bothering me and would like to talk about inserts and I do have arthritis all over and the meloxicam did help and now I am taken ibuprofen    71 y.o. female presents with the above complaint. History confirmed with patient. States the foot was feeling better but recently flared up. Relates meloxicam has been helpful. Denies any other pedal complaints. Denies n/v/f/c.   Objective:  Physical Exam: warm, good capillary refill, no trophic changes or ulcerative lesions, normal DP and PT pulses and normal sensory exam. Left Foot: POP Achilles with prominent Haglund deformity, no dell, good Achilles strength. +Silverskiold test. Decreased AJ ROM. Right Foot: POP Achilles with prominent Haglund deformity, no dell, good Achilles strength. +Silverskiold test. Decreased AJ ROM. +Allis test right   Assessment:   1. Achilles tendinitis of both lower extremities   2. Equinus deformity of foot   3. Acquired unequal limb length    Plan:  Patient was evaluated and treated and all questions answered.  Achilles Tendonitis -:Lengthy discussion regarding treatments -Rx Meloxicam -will get scheduled for CMO.  -Continue stretching, icing, use of ProStretch and Night Splint -Consider PT/surgery should pain persist. -Patient to return as needed.  Return if symptoms worsen or fail to improve.

## 2021-11-07 ENCOUNTER — Ambulatory Visit: Payer: PPO

## 2021-11-07 ENCOUNTER — Other Ambulatory Visit: Payer: Self-pay

## 2021-11-07 DIAGNOSIS — M7662 Achilles tendinitis, left leg: Secondary | ICD-10-CM

## 2021-11-07 DIAGNOSIS — M7732 Calcaneal spur, left foot: Secondary | ICD-10-CM

## 2021-11-07 DIAGNOSIS — M7661 Achilles tendinitis, right leg: Secondary | ICD-10-CM

## 2021-11-07 DIAGNOSIS — M7731 Calcaneal spur, right foot: Secondary | ICD-10-CM

## 2021-11-07 DIAGNOSIS — M2170 Unequal limb length (acquired), unspecified site: Secondary | ICD-10-CM

## 2021-11-07 DIAGNOSIS — M216X9 Other acquired deformities of unspecified foot: Secondary | ICD-10-CM

## 2021-11-07 NOTE — Progress Notes (Signed)
SITUATION Reason for Consult: Evaluation for Bilateral Custom Foot Orthoses Patient / Caregiver Report: Patient is ready for foot orthotics  OBJECTIVE DATA: Patient History / Diagnosis:    ICD-10-CM   1. Acquired unequal limb length  M21.70     2. Calcaneal spur of left foot  M77.32     3. Calcaneal spur of right foot  M77.31     4. Equinus deformity of foot  M21.6X9     5. Achilles tendinitis of both lower extremities  M76.61    M76.62       Current or Previous Devices: None and no history  Shoe size 20M  Foot Examination: Skin presentation:   Intact Ulcers & Callousing:   None and no history Toe / Foot Deformities:  Planus Weight Bearing Presentation:  planus Sensation:    Intact  ORTHOTIC RECOMMENDATION Recommended Device: 1x pair of custom functional foot orthotics  GOALS OF ORTHOSES - Reduce Pain - Prevent Foot Deformity - Prevent Progression of Further Foot Deformity - Relieve Pressure - Improve the Overall Biomechanical Function of the Foot and Lower Extremity.  ACTIONS PERFORMED Patient was casted for Foot Orthoses via crush box. Procedure was explained and patient tolerated procedure well. All questions were answered and concerns addressed.  PLAN Potential out of pocket cost was communicated to patient. Casts are to be sent to [facility] for fabrication. Patient is to be called for fitting when devices are ready.

## 2021-11-09 ENCOUNTER — Telehealth: Payer: Self-pay

## 2021-11-09 NOTE — Telephone Encounter (Signed)
Casts sent to [facility] [employer] - HOLD FOR CMN

## 2021-11-30 DIAGNOSIS — Z6828 Body mass index (BMI) 28.0-28.9, adult: Secondary | ICD-10-CM | POA: Diagnosis not present

## 2021-11-30 DIAGNOSIS — E663 Overweight: Secondary | ICD-10-CM | POA: Diagnosis not present

## 2021-11-30 DIAGNOSIS — E7800 Pure hypercholesterolemia, unspecified: Secondary | ICD-10-CM | POA: Diagnosis not present

## 2021-11-30 DIAGNOSIS — I10 Essential (primary) hypertension: Secondary | ICD-10-CM | POA: Diagnosis not present

## 2022-01-15 DIAGNOSIS — D1801 Hemangioma of skin and subcutaneous tissue: Secondary | ICD-10-CM | POA: Diagnosis not present

## 2022-01-15 DIAGNOSIS — L821 Other seborrheic keratosis: Secondary | ICD-10-CM | POA: Diagnosis not present

## 2022-01-15 DIAGNOSIS — L570 Actinic keratosis: Secondary | ICD-10-CM | POA: Diagnosis not present

## 2022-01-15 DIAGNOSIS — L814 Other melanin hyperpigmentation: Secondary | ICD-10-CM | POA: Diagnosis not present

## 2022-01-15 DIAGNOSIS — Z85828 Personal history of other malignant neoplasm of skin: Secondary | ICD-10-CM | POA: Diagnosis not present

## 2022-02-01 DIAGNOSIS — H04123 Dry eye syndrome of bilateral lacrimal glands: Secondary | ICD-10-CM | POA: Diagnosis not present

## 2022-02-14 ENCOUNTER — Ambulatory Visit (INDEPENDENT_AMBULATORY_CARE_PROVIDER_SITE_OTHER): Payer: PPO

## 2022-02-14 DIAGNOSIS — M7731 Calcaneal spur, right foot: Secondary | ICD-10-CM | POA: Diagnosis not present

## 2022-02-14 DIAGNOSIS — M7732 Calcaneal spur, left foot: Secondary | ICD-10-CM | POA: Diagnosis not present

## 2022-02-14 NOTE — Progress Notes (Signed)
SITUATION: Reason for Visit: Fitting and Delivery of Custom Fabricated Foot Orthoses Patient Report: Patient reports comfort and is satisfied with device.  OBJECTIVE DATA: Patient History / Diagnosis:     ICD-10-CM   1. Calcaneal spur of left foot  M77.32     2. Calcaneal spur of right foot  M77.31       Provided Device:  Custom Functional Foot Orthotics     [employer]: [id number]  GOAL OF ORTHOSIS - Improve gait - Decrease energy expenditure - Improve Balance - Provide Triplanar stability of foot complex - Facilitate motion  ACTIONS PERFORMED Patient was fit with foot orthotics trimmed to shoe last. Patient tolerated fittign procedure.   Patient was provided with verbal and written instruction and demonstration regarding donning, doffing, wear, care, proper fit, function, purpose, cleaning, and use of the orthosis and in all related precautions and risks and benefits regarding the orthosis.  Patient was also provided with verbal instruction regarding how to report any failures or malfunctions of the orthosis and necessary follow up care. Patient was also instructed to contact our office regarding any change in status that may affect the function of the orthosis.  Patient demonstrated independence with proper donning, doffing, and fit and verbalized understanding of all instructions.  PLAN: Patient is to follow up in one week or as necessary (PRN). All questions were answered and concerns addressed. Plan of care was discussed with and agreed upon by the patient.

## 2022-03-13 ENCOUNTER — Other Ambulatory Visit: Payer: Self-pay | Admitting: Podiatry

## 2022-03-23 DIAGNOSIS — J069 Acute upper respiratory infection, unspecified: Secondary | ICD-10-CM | POA: Diagnosis not present

## 2022-03-23 DIAGNOSIS — J040 Acute laryngitis: Secondary | ICD-10-CM | POA: Diagnosis not present

## 2022-04-03 ENCOUNTER — Other Ambulatory Visit: Payer: Self-pay | Admitting: Obstetrics and Gynecology

## 2022-04-03 DIAGNOSIS — Z1231 Encounter for screening mammogram for malignant neoplasm of breast: Secondary | ICD-10-CM

## 2022-04-03 DIAGNOSIS — Z01419 Encounter for gynecological examination (general) (routine) without abnormal findings: Secondary | ICD-10-CM | POA: Diagnosis not present

## 2022-04-23 ENCOUNTER — Ambulatory Visit
Admission: RE | Admit: 2022-04-23 | Discharge: 2022-04-23 | Disposition: A | Discharge status: Home / Self Care | Payer: PPO | Source: Ambulatory Visit | Attending: Obstetrics and Gynecology | Admitting: Obstetrics and Gynecology

## 2022-04-23 DIAGNOSIS — Z1231 Encounter for screening mammogram for malignant neoplasm of breast: Secondary | ICD-10-CM

## 2022-04-26 DIAGNOSIS — E663 Overweight: Secondary | ICD-10-CM | POA: Diagnosis not present

## 2022-04-26 DIAGNOSIS — I10 Essential (primary) hypertension: Secondary | ICD-10-CM | POA: Diagnosis not present

## 2022-04-26 DIAGNOSIS — E7800 Pure hypercholesterolemia, unspecified: Secondary | ICD-10-CM | POA: Diagnosis not present

## 2022-04-26 DIAGNOSIS — Z6828 Body mass index (BMI) 28.0-28.9, adult: Secondary | ICD-10-CM | POA: Diagnosis not present

## 2022-04-26 DIAGNOSIS — Z0000 Encounter for general adult medical examination without abnormal findings: Secondary | ICD-10-CM | POA: Diagnosis not present

## 2022-04-26 DIAGNOSIS — N951 Menopausal and female climacteric states: Secondary | ICD-10-CM | POA: Diagnosis not present

## 2022-05-23 DIAGNOSIS — K5732 Diverticulitis of large intestine without perforation or abscess without bleeding: Secondary | ICD-10-CM | POA: Diagnosis not present

## 2022-06-28 ENCOUNTER — Encounter: Payer: Self-pay | Admitting: Gastroenterology

## 2022-06-28 ENCOUNTER — Ambulatory Visit: Payer: PPO | Admitting: Gastroenterology

## 2022-06-28 VITALS — BP 140/100 | HR 78

## 2022-06-28 DIAGNOSIS — R197 Diarrhea, unspecified: Secondary | ICD-10-CM

## 2022-06-28 MED ORDER — DICYCLOMINE HCL 10 MG PO CAPS: 10.0000 mg | ORAL_CAPSULE | Freq: Three times a day (TID) | ORAL | 4 refills | Status: AC

## 2022-06-28 MED ORDER — CHOLESTYRAMINE 4 G PO PACK: 4.0000 g | PACK | Freq: Every day | ORAL | 6 refills | Status: AC

## 2022-06-28 NOTE — Progress Notes (Signed)
Chief Complaint: For UGI evaluation.  Referring Provider:  Greig Right, MD      ASSESSMENT AND PLAN;   #1. Diarrhea, likely IBS. Nl CBC, CMP, CRP. Neg stool studies including fecal elastase, GI pathogens and calprotectin. H/O constipation in the past. Neg celiac   #2. Epi pain (resolved on Protonix 40 mg p.o. daily)  #3. H/O diverticulitis s/p sigmoid resection 03/2017.  Neg colon 12/2019  Plan;  - Bentyl '10mg'$  TID #90, 4RF - Cholestramine 4g po QD #30, 6 RF. - FU Dr Bo Merino (she has seen her in past). Pt will make appt. - CT AP if still with problems - Hold off on salmon oil x 2 weeks. - Stop ETOH for now.     HPI:    Laura Hahn is a 71 y.o. female   For FU  Diarrhea 7-8/day, then occ constipation. Yellow color.  Frothy which floats on the surface.  She had negative stool studies for GI pathogens, calprotectin and fecal elastase.  Had normal CBC, CMP, TSH, celiac screen, CRP, sed rate.  Not been drinking well water anymore.  No fever or chills.  No nocturnal symptoms.  The abdominal pain has gotten much better.  She had neg US abdomen.  She denies having any dysphagia or odynophagia.  No weight loss.   Has been drinking more alcohol than usual-wine/vodka.  3-4 drinks/night.   Just got a new house (second home) at [facility].  Wt Readings from Last 3 Encounters:  09/26/20 199 lb (90.3 kg)  09/22/20 199 lb 2 oz (90.3 kg)  12/31/19 196 lb (88.9 kg)   She has been having more and more arthritis.  Problems using her fingers.  She has seen Dr Bo Merino rheumatology in past. Pt will make follow-up appointment.   Past GI work-up: -CT Abdo/pelvis 12/2016: Sigmoid diverticulitis.  Focal narrowing of the descending colon with associated twisting of mesentery although no obstruction.  Findings may be due to internal hernia versus adhesions.  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 Colonoscopy  ([facility]) 12/2019 -Mild pancolonic diverticulosis. -S/P previous sigmoid resection. Patent anastomosis. -Otherwise normal colonoscopy to cecum. The colon was highly redundant.  EGD 09/2020 - Small hiatal hernia. - Mild gastritis.  [place] abdo 09/2020 Severe RIGHT hydronephrosis unchanged since 01/01/2017. (Has seen urology) Suspected 9 mm nonobstructing LEFT renal calculus. No other abdominal sonographic abnormalities identified.  Past Medical History:  Diagnosis Date   Arthritis    neck   COVID-19 virus infection 04/2020   had both shots and got covid. been having diarrhea like 5 times a day. thought it could be some malabsoption   Diverticulosis    Elevated cholesterol    Family history of colon cancer    Family history of colonic polyps    History of kidney infection    Hypertension    states under control with meds., has been on med. x 30 yr.   IBS (irritable bowel syndrome)    Mass of finger of left hand 06/2015   small finger   Osteoarthritis    PONV (postoperative nausea and vomiting)     Past Surgical History:  Procedure Laterality Date   ABDOMINAL HYSTERECTOMY     partial    APPENDECTOMY     BOWEL RESECTION  03/2017   Dr. Amalia Hailey [facility]   breast lift     COLONOSCOPY  08/22/2015   Moderate predominantly left diverticulosis. Otherwise normal colonoscopy to terminal ilium.  The colon was highly redundant.    CYST EXCISION Right    great toe   FINGER GANGLION CYST EXCISION Right    KNEE ARTHROSCOPY Right 05/28/2008   LIPOSUCTION     MASS EXCISION Left 07/04/2015   Procedure: EXCISION MASS LEFT SMALL FINGER ;  Surgeon: Cristine Polio, MD;  Location: [place];  Service: Plastics;  Laterality: Left;   MASTOPEXY  12/2014   SKIN FULL THICKNESS GRAFT Left 07/04/2015   Procedure: SKIN GRAFT FULL THICKNESS;  Surgeon: Cristine Polio, MD;  Location: [place];  Service: Plastics;  Laterality: Left;   TONSILLECTOMY     TOTAL KNEE  ARTHROPLASTY Right 05/27/2014   Procedure: TOTAL RIGHT KNEE ARTHROPLASTY;  Surgeon: Johnn Hai, MD;  Location: [facility];  Service: Orthopedics;  Laterality: Right;   TOTAL KNEE ARTHROPLASTY Left 11/20/2017   Procedure: TOTAL LEFT KNEE ARTHROPLASTY;  Surgeon: Susa Day, MD;  Location: [facility];  Service: Orthopedics;  Laterality: Left;   WEDGE RESECTION     [place]   WISDOM TOOTH EXTRACTION      Family History  Problem Relation Age of Onset   Diverticulitis Mother    Colon polyps Mother    Pancreatic cancer Cousin        1st cousin   Colon cancer Maternal Aunt    Colon cancer Maternal Uncle    Colon cancer Maternal Great-grandmother    Pancreatic cancer Cousin    Esophageal cancer Neg Hx    Rectal cancer Neg Hx    Stomach cancer Neg Hx     Social History   Tobacco Use   Smoking status: Never   Smokeless tobacco: Never  Vaping Use   Vaping Use: Never used  Substance Use Topics   Alcohol use: Yes    Comment: 2 bottles wine/week   Drug use: No    Current Outpatient Medications  Medication Sig Dispense Refill   atorvastatin (LIPITOR) 10 MG tablet Take 20 mg by mouth daily.     carvedilol (COREG) 6.25 MG tablet 1 tablet 2 (two) times daily.     cyanocobalamin (,VITAMIN B-12,) 1000 MCG/ML injection Inject 1 mL (1,000 mcg total) into the muscle every 30 (thirty) days. 1 mL 11   dicyclomine (BENTYL) 10 MG capsule Take 1 capsule (10 mg total) by mouth in the morning and at bedtime. 60 capsule 3   estrogen-methylTESTOSTERone 0.625-1.25 MG tablet esterified estrogens-methyltestosterone 0.625 mg-1.25 mg tablet  TAKE 1 TABLET BY MOUTH EVERY DAY     irbesartan-hydrochlorothiazide (AVALIDE) 150-12.5 MG tablet Take 1 tablet by mouth 2 (two) times daily.     meloxicam (MOBIC) 15 MG tablet TAKE 1 TABLET(15 MG) BY MOUTH DAILY 30 tablet 2   milk thistle 175 MG tablet Take 175 mg by mouth daily.     Multiple Vitamin (MULTIVITAMIN WITH MINERALS) TABS tablet Take 1 tablet by mouth daily. Eye  Health Complex     pantoprazole (PROTONIX) 20 MG tablet TAKE 1 TABLET(20 MG) BY MOUTH DAILY 30 MINUTES BEFORE EATING WITH WATER 30 tablet 11   RESTASIS 0.05 % ophthalmic emulsion      albuterol (VENTOLIN HFA) 108 (90 Base) MCG/ACT inhaler Inhale into the lungs. (Patient not taking: Reported on 06/28/2022)     bifidobacterium infantis (ALIGN) capsule SMARTSIG:1 Capsule(s) By Mouth (Patient not taking: Reported on 06/28/2022)     COLLAGEN PO Take 1 Scoop by mouth daily. (Patient not taking: Reported on 09/26/2020)     estradiol (ESTRACE) 0.5 MG tablet Take 0.5  mg by mouth daily. (Patient not taking: Reported on 06/28/2022)     fluorouracil (EFUDEX) 5 % cream fluorouracil 5 % topical cream  APPLY TOPICALLY TO THE AFFECTED AREA TWICE DAILY FOR 2 WEEKS (Patient not taking: Reported on 06/28/2022)     hydroquinone 4 % cream Apply topically 2 (two) times daily. (Patient not taking: Reported on 06/28/2022)     Lactobacillus (ACIDOPHILUS PO) Take 1 capsule by mouth daily.  (Patient not taking: Reported on 06/28/2022)     methylPREDNISolone (MEDROL DOSEPAK) 4 MG TBPK tablet 6 Day Taper Pack. Take as Directed. (Patient not taking: Reported on 06/28/2022) 21 tablet 0   metroNIDAZOLE (METROGEL) 0.75 % vaginal gel metronidazole 0.75 % (37.5 mg/5 gram) vaginal gel  insert 1 applicatorful vaginally at bedtime UNTIL GONE (Patient not taking: Reported on 06/28/2022)     Naltrexone-buPROPion HCl ER (CONTRAVE) 8-90 MG TB12 Contrave 8 mg-90 mg tablet,extended release  TAKE 1 TAB DAILY X 7 DAYS, THEN 1 TAB TWICE DAILY X 7 DAYS, THEN 2 TABS in the MORNING AND 1 TAB in the EVENING X 7 days, THEN TAKE 2 TABS TWICE DAILY (Patient not taking: Reported on 06/28/2022)     neomycin-polymyxin b-dexamethasone (MAXITROL) 3.5-10000-0.1 SUSP Place into the right eye. (Patient not taking: Reported on 06/28/2022)     omeprazole (PRILOSEC) 20 MG capsule TAKE 1 CAPSULE(20 MG) BY MOUTH DAILY (Patient not taking: Reported on 06/28/2022) 30  capsule 0   prednisoLONE acetate (PRED FORTE) 1 % ophthalmic suspension prednisolone acetate 1 % eye drops,suspension (Patient not taking: Reported on 06/28/2022)     Probiotic, Lactobacillus, CAPS SMARTSIG:1 Capsule(s) By Mouth (Patient not taking: Reported on 06/28/2022)     rosuvastatin (CRESTOR) 10 MG tablet SMARTSIG:1 By Mouth (Patient not taking: Reported on 06/28/2022)     triamterene-hydrochlorothiazide (DYAZIDE) 37.5-25 MG capsule SMARTSIG:1 By Mouth (Patient not taking: Reported on 06/28/2022)     No current facility-administered medications for this visit.    Allergies  Allergen Reactions   Lisinopril Cough   Metoprolol Rash    Review of Systems:  Constitutional: Denies fever, chills, diaphoresis, appetite change and has fatigue.  neg    Physical Exam:    BP (!) 140/100   Pulse 78  Wt Readings from Last 3 Encounters:  09/26/20 199 lb (90.3 kg)  09/22/20 199 lb 2 oz (90.3 kg)  12/31/19 196 lb (88.9 kg)   Constitutional:  Well-developed, in no acute distress. Psychiatric: Normal mood and affect. Behavior is normal. HEENT: Pupils normal.  Conjunctivae are normal. No scleral icterus. Cardiovascular: Normal rate, regular rhythm. No edema Pulmonary/chest: Effort normal and breath sounds normal. No wheezing, rales or rhonchi. Abdominal: Soft, nondistended.  Minimal epigastric tenderness.  Bowel sounds active throughout. There are no masses palpable. No hepatomegaly. Rectal:  defered Neurological: Alert and oriented to person place and time. Skin: Skin is warm and dry. No rashes noted.  Data Reviewed: I have personally reviewed following labs and imaging studies  CBC:    Latest Ref Rng & Units 09/22/2020    2:45 PM 08/02/2020    8:57 AM 11/23/2017    5:42 AM  CBC  WBC 4.0 - 10.5 K/uL 5.5  4.6  8.1   Hemoglobin 12.0 - 15.0 g/dL 14.3  14.2  11.3   Hematocrit 36.0 - 46.0 % 41.6  42.1  33.0   Platelets 150.0 - 400.0 K/uL 244.0  270.0  221     CMP:    Latest Ref Rng  & Units 09/22/2020  2:45 PM 08/02/2020    8:57 AM 11/21/2017    5:44 AM  CMP  Glucose 70 - 99 mg/dL 94  109  128   BUN 6 - 23 mg/dL '25  24  11   '$ Creatinine 0.40 - 1.20 mg/dL 0.86  0.80  0.79   Sodium 135 - 145 mEq/L 139  135  131   Potassium 3.5 - 5.1 mEq/L 4.1  4.0  4.1   Chloride 96 - 112 mEq/L 101  99  97   CO2 19 - 32 mEq/L '27  26  27   '$ Calcium 8.4 - 10.5 mg/dL 10.2  9.8  9.0   Total Protein 6.0 - 8.3 g/dL 7.0  6.9    Total Bilirubin 0.2 - 1.2 mg/dL 0.6  0.5    Alkaline Phos 39 - 117 U/L 46  68    AST 0 - 37 U/L 14  21    ALT 0 - 35 U/L 14  29       Carmell Austria, MD 06/28/2022, 4:17 PM  Cc: Greig Right, MD

## 2022-06-28 NOTE — Patient Instructions (Addendum)
_______________________________________________________  If you are age 69 or older, your body mass index should be between 23-30. Your There is no height or weight on file to calculate BMI. If this is out of the aforementioned range listed, please consider follow up with your Primary Care Provider.  If you are age 39 or younger, your body mass index should be between 19-25. Your There is no height or weight on file to calculate BMI. If this is out of the aformentioned range listed, please consider follow up with your Primary Care Provider.   ________________________________________________________  The  GI providers would like to encourage you to use [facility] to communicate with providers for non-urgent requests or questions.  Due to long hold times on the telephone, sending your provider a message by [facility] may be a faster and more efficient way to get a response.  Please allow 48 business hours for a response.  Please remember that this is for non-urgent requests.  _______________________________________________________  We have sent the following medications to your pharmacy for you to pick up at your convenience: [place] salmon oil for 2 weeks Stop alcohol  Make follow up appointment with your rheumatology  Call us in 2 weeks with an update to the nurse.  Thank you,  Dr. Jackquline Denmark

## 2022-07-01 ENCOUNTER — Other Ambulatory Visit: Payer: Self-pay | Admitting: Podiatry

## 2022-07-05 ENCOUNTER — Telehealth: Payer: Self-pay | Admitting: Gastroenterology

## 2022-07-05 DIAGNOSIS — M1990 Unspecified osteoarthritis, unspecified site: Secondary | ICD-10-CM

## 2022-07-05 NOTE — Telephone Encounter (Signed)
Pt stated that she recently started taking the Questran 5 days ago on 06/30/2022: Pt stated that next day her stool was Black. Pt states that she has not being taking any iron pills or been using any pepto bismol: Pt stated that her stools are more formed now: Please advise

## 2022-07-05 NOTE — Telephone Encounter (Signed)
Inbound call from patient Inquiring about the medication Questran. Patient states her stool has went from yellow to extremely black. Also patient is inquiring about a referral that was suppose to had been placed for her? Please give patient a call back to further advise.  Thank you

## 2022-07-05 NOTE — Telephone Encounter (Signed)
Referral placed.  Remo Lipps can you please advise regarding the black stool?

## 2022-07-06 NOTE — Telephone Encounter (Signed)
There was no blood in the stool. For now please continue taking Questran Let us know next week how she is doing. RG

## 2022-07-06 NOTE — Telephone Encounter (Signed)
Pt made aware of Dr. Lyndel Safe recommendations: Pt stated that she may have found the culprit of her dark stools: Pt stated that she was mixing the Questran with tart black cherry juice and she researched this juice and the side affect of the juice is dark stools: Pt stated that today her stools are getting back to normal color:  Pt notified to contact us next week with how she is doing Pt verbalized understanding with all questions answered.

## 2022-07-11 DIAGNOSIS — Z23 Encounter for immunization: Secondary | ICD-10-CM | POA: Diagnosis not present

## 2022-07-30 DIAGNOSIS — M19049 Primary osteoarthritis, unspecified hand: Secondary | ICD-10-CM | POA: Diagnosis not present

## 2022-07-30 DIAGNOSIS — M67442 Ganglion, left hand: Secondary | ICD-10-CM | POA: Diagnosis not present

## 2022-08-01 DIAGNOSIS — M67442 Ganglion, left hand: Secondary | ICD-10-CM | POA: Diagnosis not present

## 2022-08-06 DIAGNOSIS — I10 Essential (primary) hypertension: Secondary | ICD-10-CM | POA: Diagnosis not present

## 2022-08-06 DIAGNOSIS — E7800 Pure hypercholesterolemia, unspecified: Secondary | ICD-10-CM | POA: Diagnosis not present

## 2022-08-06 DIAGNOSIS — E663 Overweight: Secondary | ICD-10-CM | POA: Diagnosis not present

## 2022-08-06 DIAGNOSIS — Z6828 Body mass index (BMI) 28.0-28.9, adult: Secondary | ICD-10-CM | POA: Diagnosis not present

## 2022-08-14 DIAGNOSIS — M79641 Pain in right hand: Secondary | ICD-10-CM | POA: Diagnosis not present

## 2022-08-14 DIAGNOSIS — M79645 Pain in left finger(s): Secondary | ICD-10-CM | POA: Diagnosis not present

## 2022-08-14 DIAGNOSIS — M79644 Pain in right finger(s): Secondary | ICD-10-CM | POA: Diagnosis not present

## 2022-08-14 DIAGNOSIS — M79642 Pain in left hand: Secondary | ICD-10-CM | POA: Diagnosis not present

## 2022-08-15 DIAGNOSIS — M67442 Ganglion, left hand: Secondary | ICD-10-CM | POA: Diagnosis not present

## 2022-08-17 DIAGNOSIS — M79645 Pain in left finger(s): Secondary | ICD-10-CM | POA: Diagnosis not present

## 2022-08-17 DIAGNOSIS — M79641 Pain in right hand: Secondary | ICD-10-CM | POA: Diagnosis not present

## 2022-08-17 DIAGNOSIS — M79644 Pain in right finger(s): Secondary | ICD-10-CM | POA: Diagnosis not present

## 2022-08-17 DIAGNOSIS — M79642 Pain in left hand: Secondary | ICD-10-CM | POA: Diagnosis not present

## 2022-08-17 HISTORY — PX: GANGLION CYST EXCISION: SHX1691

## 2022-08-24 DIAGNOSIS — M79642 Pain in left hand: Secondary | ICD-10-CM | POA: Diagnosis not present

## 2022-08-24 DIAGNOSIS — M79644 Pain in right finger(s): Secondary | ICD-10-CM | POA: Diagnosis not present

## 2022-08-24 DIAGNOSIS — M79641 Pain in right hand: Secondary | ICD-10-CM | POA: Diagnosis not present

## 2022-08-24 DIAGNOSIS — M79645 Pain in left finger(s): Secondary | ICD-10-CM | POA: Diagnosis not present

## 2022-08-27 DIAGNOSIS — M79642 Pain in left hand: Secondary | ICD-10-CM | POA: Diagnosis not present

## 2022-08-27 DIAGNOSIS — M79641 Pain in right hand: Secondary | ICD-10-CM | POA: Diagnosis not present

## 2022-08-27 DIAGNOSIS — M79644 Pain in right finger(s): Secondary | ICD-10-CM | POA: Diagnosis not present

## 2022-08-27 DIAGNOSIS — M79645 Pain in left finger(s): Secondary | ICD-10-CM | POA: Diagnosis not present

## 2022-08-29 DIAGNOSIS — M79641 Pain in right hand: Secondary | ICD-10-CM | POA: Diagnosis not present

## 2022-08-29 DIAGNOSIS — M79642 Pain in left hand: Secondary | ICD-10-CM | POA: Diagnosis not present

## 2022-08-29 DIAGNOSIS — M79645 Pain in left finger(s): Secondary | ICD-10-CM | POA: Diagnosis not present

## 2022-08-29 DIAGNOSIS — M79644 Pain in right finger(s): Secondary | ICD-10-CM | POA: Diagnosis not present

## 2022-08-31 DIAGNOSIS — G5633 Lesion of radial nerve, bilateral upper limbs: Secondary | ICD-10-CM | POA: Diagnosis not present

## 2022-08-31 DIAGNOSIS — Z87891 Personal history of nicotine dependence: Secondary | ICD-10-CM | POA: Diagnosis not present

## 2022-08-31 DIAGNOSIS — G5632 Lesion of radial nerve, left upper limb: Secondary | ICD-10-CM | POA: Diagnosis not present

## 2022-08-31 DIAGNOSIS — M67442 Ganglion, left hand: Secondary | ICD-10-CM | POA: Diagnosis not present

## 2022-09-06 DIAGNOSIS — M79644 Pain in right finger(s): Secondary | ICD-10-CM | POA: Diagnosis not present

## 2022-09-06 DIAGNOSIS — M79642 Pain in left hand: Secondary | ICD-10-CM | POA: Diagnosis not present

## 2022-09-06 DIAGNOSIS — M79641 Pain in right hand: Secondary | ICD-10-CM | POA: Diagnosis not present

## 2022-09-06 DIAGNOSIS — M79645 Pain in left finger(s): Secondary | ICD-10-CM | POA: Diagnosis not present

## 2022-09-13 DIAGNOSIS — M79642 Pain in left hand: Secondary | ICD-10-CM | POA: Diagnosis not present

## 2022-09-13 DIAGNOSIS — M79641 Pain in right hand: Secondary | ICD-10-CM | POA: Diagnosis not present

## 2022-09-13 DIAGNOSIS — M79645 Pain in left finger(s): Secondary | ICD-10-CM | POA: Diagnosis not present

## 2022-09-13 DIAGNOSIS — M79644 Pain in right finger(s): Secondary | ICD-10-CM | POA: Diagnosis not present

## 2022-09-25 DIAGNOSIS — M79644 Pain in right finger(s): Secondary | ICD-10-CM | POA: Diagnosis not present

## 2022-09-25 DIAGNOSIS — M79642 Pain in left hand: Secondary | ICD-10-CM | POA: Diagnosis not present

## 2022-09-25 DIAGNOSIS — M79645 Pain in left finger(s): Secondary | ICD-10-CM | POA: Diagnosis not present

## 2022-09-25 DIAGNOSIS — M79641 Pain in right hand: Secondary | ICD-10-CM | POA: Diagnosis not present

## 2022-09-27 DIAGNOSIS — M79641 Pain in right hand: Secondary | ICD-10-CM | POA: Diagnosis not present

## 2022-09-27 DIAGNOSIS — M79644 Pain in right finger(s): Secondary | ICD-10-CM | POA: Diagnosis not present

## 2022-09-27 DIAGNOSIS — M79645 Pain in left finger(s): Secondary | ICD-10-CM | POA: Diagnosis not present

## 2022-09-27 DIAGNOSIS — M79642 Pain in left hand: Secondary | ICD-10-CM | POA: Diagnosis not present

## 2022-10-01 DIAGNOSIS — M79645 Pain in left finger(s): Secondary | ICD-10-CM | POA: Diagnosis not present

## 2022-10-01 DIAGNOSIS — M79644 Pain in right finger(s): Secondary | ICD-10-CM | POA: Diagnosis not present

## 2022-10-01 DIAGNOSIS — M79641 Pain in right hand: Secondary | ICD-10-CM | POA: Diagnosis not present

## 2022-10-01 DIAGNOSIS — M79642 Pain in left hand: Secondary | ICD-10-CM | POA: Diagnosis not present

## 2022-10-04 DIAGNOSIS — M79644 Pain in right finger(s): Secondary | ICD-10-CM | POA: Diagnosis not present

## 2022-10-04 DIAGNOSIS — M79642 Pain in left hand: Secondary | ICD-10-CM | POA: Diagnosis not present

## 2022-10-04 DIAGNOSIS — M79645 Pain in left finger(s): Secondary | ICD-10-CM | POA: Diagnosis not present

## 2022-10-04 DIAGNOSIS — M79641 Pain in right hand: Secondary | ICD-10-CM | POA: Diagnosis not present

## 2022-10-04 NOTE — Progress Notes (Signed)
Office Visit Note  Patient: Laura Hahn             Date of Birth: Feb 09, 1951           MRN: [id number]             PCP: Greig Right, MD Referring: Jackquline Denmark, MD Visit Date: 10/05/2022 Occupation: '@GUAROCC'$ @  Subjective:  Pain in multiple joints  History of Present Illness: Laura Hahn is a 72 y.o. female in consultation per request of Dr. Lyndel Safe.  According to the patient her symptoms started in her mid 93s with pain and discomfort in her knee joints.  She has seen Dr. Maxie Better over the years.  She is also had lower back pain off and on over the last several years.  She has been diagnosed with degenerative disc disease of the lumbar spine.  She states she underwent right total knee replacement in 2015 and left total knee replacement in 2019.  She had good recovery from the surgery.  Although after her left total knee replacement in 2019 she started having pain and discomfort in her left Achilles tendon.  She has had recurrent pain in her left Achilles tendon since then.  She still has discomfort in her left Achilles tendon.  She has been having off-and-on discomfort in her right Achilles tendon.  She has seen Dr. Doran Durand who referred her to physical therapy which did not help.  She also got new orthotics without much relief.  She was taking meloxicam on a regular basis which had to be discontinued due to gastritis.  She complains of neck a stiffness since she was in her mid 11s.  Patient states that she has limited range of motion of her cervical spine.  She was evaluated by Dr. Rolena Infante in the past.  She tried physical therapy and traction.  She still have discomfort in her cervical spine.  She denies thoracic pain.  She has been experiencing pain and discomfort in her hands for the last 10 years which is gradually getting worse.  She is currently going to physical therapy and she was given some ring splints.  None of the other joints are painful.  There is family history of  osteoarthritis in her father and 3 siblings.  There is history of psoriatic arthritis in her brother, ulcerative colitis in her sister and rheumatoid arthritis and another sister.  She is gravida 2, para 2 miscarriages 0.    Activities of Daily Living:  Patient reports morning stiffness for all day. Patient Reports nocturnal pain.  Difficulty dressing/grooming: Reports Difficulty climbing stairs: Denies Difficulty getting out of chair: Reports Difficulty using hands for taps, buttons, cutlery, and/or writing: Reports  Review of Systems  Constitutional:  Positive for fatigue.  HENT:  Positive for mouth dryness. Negative for mouth sores.   Eyes:  Negative for dryness.  Respiratory:  Negative for shortness of breath.   Cardiovascular:  Negative for chest pain and palpitations.  Gastrointestinal:  Positive for constipation and diarrhea. Negative for blood in stool.  Endocrine: Negative for increased urination.  Genitourinary:  Negative for involuntary urination.  Musculoskeletal:  Positive for joint pain, joint pain, joint swelling and morning stiffness. Negative for gait problem, myalgias, muscle weakness, muscle tenderness and myalgias.  Skin:  Negative for color change, rash, hair loss and sensitivity to sunlight.  Allergic/Immunologic: Negative for susceptible to infections.  Neurological:  Negative for dizziness and headaches.  Hematological:  Negative for swollen glands.  Psychiatric/Behavioral:  Positive for sleep  disturbance. Negative for depressed mood. The patient is nervous/anxious.     PMFS History:  Patient Active Problem List   Diagnosis Date Noted   Anesthesia to pain 05/02/2020   Neck stiffness 05/02/2020   Acquired short Achilles tendon of right lower extremity 10/28/2019   Achilles tendonitis, bilateral 10/28/2019   Posterior calcaneal exostosis 10/28/2019   Lump on finger 05/26/2018   Localized, primary osteoarthritis of hand 02/25/2018   History of total knee  replacement, left 11/26/2017   Primary osteoarthritis of left knee 11/20/2017   Left knee DJD 11/20/2017   Diverticulitis 12/25/2016   History of vaginal hysterectomy 12/25/2016   Hypertension 12/13/2015   Osteoarthritis 12/13/2015   Hyponatremia 05/29/2014   Hypokalemia 05/29/2014   Abdominal pain, left lower quadrant 05/29/2014   Right knee DJD 05/27/2014    Past Medical History:  Diagnosis Date   Arthritis    neck   COVID-19 virus infection 04/2020   had both shots and got covid. been having diarrhea like 5 times a day. thought it could be some malabsoption   Diverticulosis    Elevated cholesterol    Family history of colon cancer    Family history of colonic polyps    History of kidney infection    Hypertension    states under control with meds., has been on med. x 30 yr.   IBS (irritable bowel syndrome)    Mass of finger of left hand 06/2015   small finger   Osteoarthritis    PONV (postoperative nausea and vomiting)     Family History  Problem Relation Age of Onset   Osteoarthritis Mother    Diverticulitis Mother    Colon polyps Mother    Stroke Father    Dementia Father    Osteoarthritis Father    Osteoarthritis Sister    Fibromyalgia Sister    Rheum arthritis Sister    Osteoarthritis Brother    Osteoarthritis Brother    Colon cancer Maternal Aunt    Colon cancer Maternal Uncle    Pancreatic cancer Cousin        1st cousin   Pancreatic cancer Cousin    Colon cancer Maternal Great-grandmother    Healthy Son    Healthy Daughter    Esophageal cancer Neg Hx    Rectal cancer Neg Hx    Stomach cancer Neg Hx    Past Surgical History:  Procedure Laterality Date   ABDOMINAL HYSTERECTOMY     partial    APPENDECTOMY     BOWEL RESECTION  03/2017   Dr. Amalia Hailey [facility]   breast lift     COLONOSCOPY  08/22/2015   Moderate predominantly left diverticulosis. Otherwise normal colonoscopy to terminal ilium. The colon was highly redundant.    CYST  EXCISION Right    great toe   FINGER GANGLION CYST EXCISION Right    GANGLION CYST EXCISION Left 08/2022   KNEE ARTHROSCOPY Right 05/28/2008   LIPOSUCTION     MASS EXCISION Left 07/04/2015   Procedure: EXCISION MASS LEFT SMALL FINGER ;  Surgeon: Cristine Polio, MD;  Location: [place];  Service: Plastics;  Laterality: Left;   MASTOPEXY  12/2014   SKIN FULL THICKNESS GRAFT Left 07/04/2015   Procedure: SKIN GRAFT FULL THICKNESS;  Surgeon: Cristine Polio, MD;  Location: [place];  Service: Plastics;  Laterality: Left;   TONSILLECTOMY     TOTAL KNEE ARTHROPLASTY Right 05/27/2014   Procedure: TOTAL RIGHT KNEE ARTHROPLASTY;  Surgeon: Johnn Hai,  MD;  Location: [facility];  Service: Orthopedics;  Laterality: Right;   TOTAL KNEE ARTHROPLASTY Left 11/20/2017   Procedure: TOTAL LEFT KNEE ARTHROPLASTY;  Surgeon: Susa Day, MD;  Location: [facility];  Service: Orthopedics;  Laterality: Left;   WEDGE RESECTION     ovary   WISDOM TOOTH EXTRACTION     Social History   Social History Narrative   Not on file    There is no immunization history on file for this patient.   Objective: Vital Signs: BP (!) 164/95 (BP Location: Left Arm, Patient Position: Sitting, Cuff Size: Normal)   Pulse 95   Resp 15   Ht '5\' 8"'$  (1.727 m)   Wt 198 lb 12.8 oz (90.2 kg)   BMI 30.23 kg/m    Physical Exam Vitals and nursing note reviewed.  Constitutional:      Appearance: She is well-developed.  HENT:     Head: Normocephalic and atraumatic.  Eyes:     Conjunctiva/sclera: Conjunctivae normal.  Cardiovascular:     Rate and Rhythm: Normal rate and regular rhythm.     Heart sounds: Normal heart sounds.  Pulmonary:     Effort: Pulmonary effort is normal.     Breath sounds: Normal breath sounds.  Abdominal:     General: Bowel sounds are normal.     Palpations: Abdomen is soft.  Musculoskeletal:     Cervical back: Normal range of motion.  Lymphadenopathy:     Cervical:  No cervical adenopathy.  Skin:    General: Skin is warm and dry.     Capillary Refill: Capillary refill takes less than 2 seconds.  Neurological:     Mental Status: She is alert and oriented to person, place, and time.  Psychiatric:        Behavior: Behavior normal.      Musculoskeletal Exam: Patient had limited range of motion of the cervical spine with limited lateral rotation, flexion and extension.  She had limited mobility in thoracic and lumbar spine.  Schober's was negative.  She had mild tenderness over SI joints.  Shoulder joints and elbow joints with good range of motion.  Wrist joints with good range of motion.  She had limited range of motion of her PIP and DIP joints.  Thickening of bilateral second MCP joints was noted.  She had synovitis in her right fourth PIP joint.  She had some limitation with range of motion of her left hip joint.  Right hip joint was in good range of motion.  Bilateral knee joints were replaced with limited extension.  There was no tenderness over Achilles tendon or plantar fascia.  MTP PIP and DIP thickening with no synovitis was noted.  No dactylitis was noted.  CDAI Exam: CDAI Score: -- Patient Global: --; Provider Global: -- Swollen: --; Tender: -- Joint Exam 10/05/2022   No joint exam has been documented for this visit   There is currently no information documented on the homunculus. Go to the Rheumatology activity and complete the homunculus joint exam.  Investigation: No additional findings.  Imaging: No results found.  Recent Labs: Lab Results  Component Value Date   WBC 5.5 09/22/2020   HGB 14.3 09/22/2020   PLT 244.0 09/22/2020   NA 139 09/22/2020   K 4.1 09/22/2020   CL 101 09/22/2020   CO2 27 09/22/2020   GLUCOSE 94 09/22/2020   BUN 25 (H) 09/22/2020   CREATININE 0.86 09/22/2020   BILITOT 0.6 09/22/2020   ALKPHOS 46 09/22/2020  AST 14 09/22/2020   ALT 14 09/22/2020   PROT 7.0 09/22/2020   ALBUMIN 4.6 09/22/2020    CALCIUM 10.2 09/22/2020   GFRAA >60 11/21/2017    Speciality Comments: No specialty comments available.  Procedures:  No procedures performed Allergies: Lisinopril and Metoprolol   Assessment / Plan:     Visit Diagnoses: Polyarthralgia -patient complains of pain and discomfort in multiple joints since she was in her 35s.  Pain in both hands -she had incomplete fist formation bilaterally with limited flexion and extension of several of her PIP and DIP joints.  Inflammation was noted in her right fourth PIP joint.  She has been going to physical therapy and wearing her ring splints.  She gives history of pain and discomfort in her bilateral hands since she was in her 52s.  She states the pain has been gradually getting worse.  Plan: XR Hand 2 View Right, XR Hand 2 View Left, x-rays were consistent with severe erosive osteoarthritis of bilateral hands.  Sedimentation rate, Rheumatoid factor, Cyclic citrul peptide antibody, IgG, ANA  Status post total knee replacement, bilateral -she had discomfort in her knee joints when she was in her mid 60s.  She underwent bilateral total knee replacement by Dr. Maxie Better.  RTKR 2015, LTKR 2019 by Dr. Maxie Better  Achilles tendonitis, bilateral -she complains of recurrent Achilles tendinitis since 2019.  She has tried physical therapy, orthotics without much results.  She was taking meloxicam for tendinitis but it had to be discontinued due to gastritis.  Plan: HLA-B27 antigen  Planter fasciitis-patient gives history of recurrent plantar fasciitis which is manageable with the stretching and exercising per patient.  Pain in both feet -she complains of discomfort in her bilateral feet.  She has been evaluated by Dr. Doran Durand and also by podiatrist in the past.  She was advised that she had osteoarthritis.  No synovitis or dactylitis was noted today.  Plan: XR Foot 2 Views Right, XR Foot 2 Views Left.  X-rays are consistent with severe osteoarthritis.  DDD (degenerative disc  disease), cervical -she had limited lateral rotation flexion and extension of the cervical spine.  Patient states she has been told that she had degenerative disc disease.  She has seen Dr. Rolena Infante in the past.  Plan: XR Cervical Spine 2 or 3 views.  X-rays show severe degenerative disc disease and facet joint arthropathy.  DDD (degenerative disc disease), lumbar -she has chronic discomfort in the lumbar spine.  She had limited range of motion of lumbar spine and thoracic spine.  Schober's was negative.  She had no point tenderness.  Plan: XR Lumbar Spine 2-3 Views.  X-rays showed degenerative disc disease and facet joint arthropathy.  Chronic SI joint pain -she had mild tenderness over SI joints.  Plan: XR Pelvis 1-2 Views.  No SI joint sclerosis or narrowing was noted.  No hip joint narrowing was noted.  Primary hypertension-her blood pressure was elevated at 150/90.  Repeat blood pressure was repeated.  Patient was advised to monitor blood pressure closely and follow-up with the PCP.  Diverticulitis -she has not had any bouts of diverticulitis since she had bowel resection.  S/p bowel resection.  She is followed by Dr. Lyndel Safe  Other fatigue - Plan: CBC with Differential/Platelet, COMPLETE METABOLIC PANEL WITH GFR, CK  Alcohol use-she drinks 10 glasses of wine per week.  History of vaginal hysterectomy  Family history of psoriatic arthritis-Brother  Family history of ulcerative colitis-sister  Family history of rheumatoid arthritis-sister, maternal aunt  Family history of fibromyalgia-sister  Family history of osteoarthritis-father and siblings    Orders: Orders Placed This Encounter  Procedures   XR Hand 2 View Right   XR Hand 2 View Left   XR Foot 2 Views Right   XR Foot 2 Views Left   XR Lumbar Spine 2-3 Views   XR Pelvis 1-2 Views   XR Cervical Spine 2 or 3 views   CBC with Differential/Platelet   COMPLETE METABOLIC PANEL WITH GFR   Sedimentation rate   CK   Rheumatoid  factor   Cyclic citrul peptide antibody, IgG   HLA-B27 antigen   ANA   No orders of the defined types were placed in this encounter.   Follow-Up Instructions: Return for pain in joints.   Bo Merino, MD  Note - This record has been created using Editor, commissioning.  Chart creation errors have been sought, but may not always  have been located. Such creation errors do not reflect on  the standard of medical care.

## 2022-10-05 ENCOUNTER — Ambulatory Visit (INDEPENDENT_AMBULATORY_CARE_PROVIDER_SITE_OTHER): Payer: PPO

## 2022-10-05 ENCOUNTER — Ambulatory Visit: Payer: PPO

## 2022-10-05 ENCOUNTER — Ambulatory Visit: Payer: PPO | Attending: Rheumatology | Admitting: Rheumatology

## 2022-10-05 ENCOUNTER — Encounter: Payer: Self-pay | Admitting: Rheumatology

## 2022-10-05 VITALS — BP 164/95 | HR 95 | Resp 15 | Ht 68.0 in | Wt 198.8 lb

## 2022-10-05 DIAGNOSIS — M5030 Other cervical disc degeneration, unspecified cervical region: Secondary | ICD-10-CM

## 2022-10-05 DIAGNOSIS — M79671 Pain in right foot: Secondary | ICD-10-CM | POA: Diagnosis not present

## 2022-10-05 DIAGNOSIS — M2550 Pain in unspecified joint: Secondary | ICD-10-CM

## 2022-10-05 DIAGNOSIS — Z90710 Acquired absence of both cervix and uterus: Secondary | ICD-10-CM | POA: Diagnosis not present

## 2022-10-05 DIAGNOSIS — G8929 Other chronic pain: Secondary | ICD-10-CM

## 2022-10-05 DIAGNOSIS — M79642 Pain in left hand: Secondary | ICD-10-CM | POA: Diagnosis not present

## 2022-10-05 DIAGNOSIS — M79672 Pain in left foot: Secondary | ICD-10-CM

## 2022-10-05 DIAGNOSIS — M79641 Pain in right hand: Secondary | ICD-10-CM

## 2022-10-05 DIAGNOSIS — M533 Sacrococcygeal disorders, not elsewhere classified: Secondary | ICD-10-CM | POA: Diagnosis not present

## 2022-10-05 DIAGNOSIS — M5136 Other intervertebral disc degeneration, lumbar region: Secondary | ICD-10-CM

## 2022-10-05 DIAGNOSIS — K5792 Diverticulitis of intestine, part unspecified, without perforation or abscess without bleeding: Secondary | ICD-10-CM | POA: Diagnosis not present

## 2022-10-05 DIAGNOSIS — Z789 Other specified health status: Secondary | ICD-10-CM

## 2022-10-05 DIAGNOSIS — M7662 Achilles tendinitis, left leg: Secondary | ICD-10-CM | POA: Diagnosis not present

## 2022-10-05 DIAGNOSIS — Z8261 Family history of arthritis: Secondary | ICD-10-CM

## 2022-10-05 DIAGNOSIS — I10 Essential (primary) hypertension: Secondary | ICD-10-CM | POA: Diagnosis not present

## 2022-10-05 DIAGNOSIS — R5383 Other fatigue: Secondary | ICD-10-CM

## 2022-10-05 DIAGNOSIS — M7661 Achilles tendinitis, right leg: Secondary | ICD-10-CM

## 2022-10-05 DIAGNOSIS — Z96653 Presence of artificial knee joint, bilateral: Secondary | ICD-10-CM | POA: Diagnosis not present

## 2022-10-05 DIAGNOSIS — Z840 Family history of diseases of the skin and subcutaneous tissue: Secondary | ICD-10-CM | POA: Diagnosis not present

## 2022-10-05 DIAGNOSIS — Z8269 Family history of other diseases of the musculoskeletal system and connective tissue: Secondary | ICD-10-CM

## 2022-10-05 DIAGNOSIS — Z8379 Family history of other diseases of the digestive system: Secondary | ICD-10-CM

## 2022-10-05 DIAGNOSIS — M722 Plantar fascial fibromatosis: Secondary | ICD-10-CM

## 2022-10-09 NOTE — Progress Notes (Signed)
Please add ANA, C3 and C4

## 2022-10-11 LAB — CBC WITH DIFFERENTIAL/PLATELET
Absolute Monocytes: 588 cells/uL (ref 200–950)
Basophils Absolute: 42 cells/uL (ref 0–200)
Basophils Relative: 0.7 %
Eosinophils Absolute: 180 cells/uL (ref 15–500)
Eosinophils Relative: 3 %
HCT: 42.2 % (ref 35.0–45.0)
Hemoglobin: 14.3 g/dL (ref 11.7–15.5)
Lymphs Abs: 2232 cells/uL (ref 850–3900)
MCH: 33.4 pg — ABNORMAL HIGH (ref 27.0–33.0)
MCHC: 33.9 g/dL (ref 32.0–36.0)
MCV: 98.6 fL (ref 80.0–100.0)
MPV: 10.7 fL (ref 7.5–12.5)
Monocytes Relative: 9.8 %
Neutro Abs: 2958 cells/uL (ref 1500–7800)
Neutrophils Relative %: 49.3 %
Platelets: 275 10*3/uL (ref 140–400)
RBC: 4.28 10*6/uL (ref 3.80–5.10)
RDW: 11.9 % (ref 11.0–15.0)
Total Lymphocyte: 37.2 %
WBC: 6 10*3/uL (ref 3.8–10.8)

## 2022-10-11 LAB — COMPLETE METABOLIC PANEL WITH GFR
AG Ratio: 1.8 (calc) (ref 1.0–2.5)
ALT: 26 U/L (ref 6–29)
AST: 20 U/L (ref 10–35)
Albumin: 4.5 g/dL (ref 3.6–5.1)
Alkaline phosphatase (APISO): 45 U/L (ref 37–153)
BUN: 14 mg/dL (ref 7–25)
CO2: 31 mmol/L (ref 20–32)
Calcium: 10.3 mg/dL (ref 8.6–10.4)
Chloride: 96 mmol/L — ABNORMAL LOW (ref 98–110)
Creat: 0.82 mg/dL (ref 0.50–1.05)
Globulin: 2.5 g/dL (calc) (ref 1.9–3.7)
Glucose, Bld: 100 mg/dL — ABNORMAL HIGH (ref 65–99)
Potassium: 4.2 mmol/L (ref 3.5–5.3)
Sodium: 136 mmol/L (ref 135–146)
Total Bilirubin: 0.8 mg/dL (ref 0.2–1.2)
Total Protein: 7 g/dL (ref 6.1–8.1)
eGFR: 77 mL/min/{1.73_m2} (ref >=60)

## 2022-10-11 LAB — ANTI-NUCLEAR AB-TITER (ANA TITER): ANA Titer 1: 1:80 {titer} — ABNORMAL HIGH

## 2022-10-11 LAB — TEST AUTHORIZATION

## 2022-10-11 LAB — CK: Total CK: 47 U/L (ref 29–143)

## 2022-10-11 LAB — C3 AND C4
C3 Complement: 127 mg/dL (ref 83–193)
C4 Complement: 26 mg/dL (ref 15–57)

## 2022-10-11 LAB — ANA: Anti Nuclear Antibody (ANA): POSITIVE — AB

## 2022-10-11 LAB — RHEUMATOID FACTOR: Rheumatoid fact SerPl-aCnc: <14 IU/mL (ref <14)

## 2022-10-11 LAB — CYCLIC CITRUL PEPTIDE ANTIBODY, IGG: Cyclic Citrullin Peptide Ab: <16 UNITS

## 2022-10-11 LAB — SEDIMENTATION RATE: Sed Rate: 2 mm/h (ref 0–30)

## 2022-10-11 LAB — HLA-B27 ANTIGEN: HLA-B27 Antigen: NEGATIVE

## 2022-10-11 NOTE — Progress Notes (Signed)
I will discuss results at the follow-up visit.

## 2022-10-14 NOTE — Progress Notes (Signed)
Office Visit Note  Patient: Laura Hahn             Date of Birth: 10/04/1950           MRN: [id number]             PCP: Greig Right, MD Referring: Greig Right, MD Visit Date: 10/26/2022 Occupation: @GUAROCC$ @  Subjective:  Pain in multiple joints   History of Present Illness: Laura Hahn is a 72 y.o. female with history of pain in multiple joints.  She states she continues to have discomfort in her cervical spine, lumbar spine, bilateral hands, bilateral feet.  She denies any radiculopathy from the cervical and lumbar spine.  She continues to have some swelling in her hands.  She used to have cortisone injections in the past and her PIP joints by her orthopedic surgeon.  Also gives history of intermittent discomfort in her Achilles tendon region.  She takes NSAIDs on as needed basis and Tylenol.    Activities of Daily Living:  Patient reports morning stiffness for 30 minutes.   Patient Reports nocturnal pain.  Difficulty dressing/grooming: Denies Difficulty climbing stairs: Denies Difficulty getting out of chair: Denies Difficulty using hands for taps, buttons, cutlery, and/or writing: Reports  Review of Systems  Constitutional:  Positive for fatigue.  HENT:  Positive for mouth dryness. Negative for mouth sores.   Eyes: Negative.  Negative for dryness.  Respiratory: Negative.  Negative for shortness of breath.   Cardiovascular:  Negative for palpitations.  Gastrointestinal:  Positive for constipation. Negative for blood in stool and diarrhea.  Endocrine: Positive for increased urination.  Genitourinary: Negative.  Negative for involuntary urination.  Musculoskeletal:  Positive for joint pain, gait problem, joint pain, joint swelling, myalgias, morning stiffness and myalgias. Negative for muscle weakness and muscle tenderness.  Skin: Negative.  Negative for color change, rash, hair loss and sensitivity to sunlight.  Allergic/Immunologic: Negative.   Negative for susceptible to infections.  Neurological:  Negative for dizziness and headaches.  Hematological: Negative.  Negative for swollen glands.  Psychiatric/Behavioral: Negative.  Negative for depressed mood and sleep disturbance. The patient is not nervous/anxious.     PMFS History:  Patient Active Problem List   Diagnosis Date Noted   Anesthesia to pain 05/02/2020   Neck stiffness 05/02/2020   Acquired short Achilles tendon of right lower extremity 10/28/2019   Achilles tendonitis, bilateral 10/28/2019   Posterior calcaneal exostosis 10/28/2019   Lump on finger 05/26/2018   Localized, primary osteoarthritis of hand 02/25/2018   History of total knee replacement, left 11/26/2017   Primary osteoarthritis of left knee 11/20/2017   Left knee DJD 11/20/2017   Diverticulitis 12/25/2016   History of vaginal hysterectomy 12/25/2016   Hypertension 12/13/2015   Osteoarthritis 12/13/2015   Hyponatremia 05/29/2014   Hypokalemia 05/29/2014   Abdominal pain, left lower quadrant 05/29/2014   Right knee DJD 05/27/2014    Past Medical History:  Diagnosis Date   Arthritis    neck   COVID-19 virus infection 04/2020   had both shots and got covid. been having diarrhea like 5 times a day. thought it could be some malabsoption   Diverticulosis    Elevated cholesterol    Family history of colon cancer    Family history of colonic polyps    History of kidney infection    Hypertension    states under control with meds., has been on med. x 30 yr.   IBS (irritable bowel syndrome)    Mass  of finger of left hand 06/2015   small finger   Osteoarthritis    PONV (postoperative nausea and vomiting)     Family History  Problem Relation Age of Onset   Osteoarthritis Mother    Diverticulitis Mother    Colon polyps Mother    Stroke Father    Dementia Father    Osteoarthritis Father    Osteoarthritis Sister    Fibromyalgia Sister    Rheum arthritis Sister    Osteoarthritis Brother     Osteoarthritis Brother    Colon cancer Maternal Aunt    Colon cancer Maternal Uncle    Pancreatic cancer Cousin        1st cousin   Pancreatic cancer Cousin    Colon cancer Maternal Great-grandmother    Healthy Son    Healthy Daughter    Esophageal cancer Neg Hx    Rectal cancer Neg Hx    Stomach cancer Neg Hx    Past Surgical History:  Procedure Laterality Date   ABDOMINAL HYSTERECTOMY     partial    APPENDECTOMY     BOWEL RESECTION  03/2017   Dr. Amalia Hailey [facility]   breast lift     COLONOSCOPY  08/22/2015   Moderate predominantly left diverticulosis. Otherwise normal colonoscopy to terminal ilium. The colon was highly redundant.    CYST EXCISION Right    great toe   FINGER GANGLION CYST EXCISION Right    GANGLION CYST EXCISION Left 08/2022   KNEE ARTHROSCOPY Right 05/28/2008   LIPOSUCTION     MASS EXCISION Left 07/04/2015   Procedure: EXCISION MASS LEFT SMALL FINGER ;  Surgeon: Cristine Polio, MD;  Location: [place];  Service: Plastics;  Laterality: Left;   MASTOPEXY  12/2014   SKIN FULL THICKNESS GRAFT Left 07/04/2015   Procedure: SKIN GRAFT FULL THICKNESS;  Surgeon: Cristine Polio, MD;  Location: [place];  Service: Plastics;  Laterality: Left;   TONSILLECTOMY     TOTAL KNEE ARTHROPLASTY Right 05/27/2014   Procedure: TOTAL RIGHT KNEE ARTHROPLASTY;  Surgeon: Johnn Hai, MD;  Location: [facility];  Service: Orthopedics;  Laterality: Right;   TOTAL KNEE ARTHROPLASTY Left 11/20/2017   Procedure: TOTAL LEFT KNEE ARTHROPLASTY;  Surgeon: Susa Day, MD;  Location: [facility];  Service: Orthopedics;  Laterality: Left;   WEDGE RESECTION     ovary   WISDOM TOOTH EXTRACTION     Social History   Social History Narrative   Not on file   Immunization History  Administered Date(s) Administered   Moderna Sars-Covid-2 Vaccination 12/13/2019     Objective: Vital Signs: BP (!) 142/94 (BP Location: Left Arm, Patient Position:  Sitting, Cuff Size: Normal)   Pulse 69   Resp 16   Ht 5' 8"$  (1.727 m)   Wt 191 lb (86.6 kg)   BMI 29.04 kg/m    Physical Exam Vitals and nursing note reviewed.  Constitutional:      Appearance: She is well-developed.  HENT:     Head: Normocephalic and atraumatic.  Eyes:     Conjunctiva/sclera: Conjunctivae normal.  Cardiovascular:     Rate and Rhythm: Normal rate and regular rhythm.     Heart sounds: Normal heart sounds.  Pulmonary:     Effort: Pulmonary effort is normal.     Breath sounds: Normal breath sounds.  Abdominal:     General: Bowel sounds are normal.     Palpations: Abdomen is soft.  Musculoskeletal:     Cervical back:  Normal range of motion.  Lymphadenopathy:     Cervical: No cervical adenopathy.  Skin:    General: Skin is warm and dry.     Capillary Refill: Capillary refill takes less than 2 seconds.  Neurological:     Mental Status: She is alert and oriented to person, place, and time.  Psychiatric:        Behavior: Behavior normal.      Musculoskeletal Exam: She had limited lateral rotation of the cervical spine.  She had discomfort range of motion of the thoracic and lumbar spine.  Shoulders, elbows, wrist joints were in good range of motion.  She had bilateral CMC subluxation.  She had by bilateral PIP and DIP thickening with inflammatory changes in several of her PIP joints.  Hip joints were in good range of motion.  Knee joints were in good range of motion which were replaced.  There was no tenderness over ankles or MTPs.  There was no Achilles tendinitis of Planter fasciitis.  CDAI Exam: CDAI Score: -- Patient Global: --; Provider Global: -- Swollen: --; Tender: -- Joint Exam 10/26/2022   No joint exam has been documented for this visit   There is currently no information documented on the homunculus. Go to the Rheumatology activity and complete the homunculus joint exam.  Investigation: No additional findings.  Imaging: XR Hand 2 View  Left  Result Date: 10/05/2022 Severe CMC narrowing and subluxation with spurring was noted.  Second MCP narrowing was noted.  Severe PIP and DIP narrowing with central erosive changes and some of the PIP and DIP joints was noted.  No intercarpal or radiocarpal joint space narrowing was noted. Impression: These findings are consistent with severe osteoarthritis of the hand.  XR Hand 2 View Right  Result Date: 10/05/2022 Severe CMC narrowing and spurring was noted.  Second MCP narrowing was noted.  Severe narrowing of PIP and DIP joints with central erosive changes were noted in several of the PIP and DIP joints.  Ankylosis of fourth DIP joint was noted.  Soft tissue swelling of the right fourth PIP joint was noted.  No intertarsal or radiocarpal joint space narrowing was noted. 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  XR Foot 2 Views Right  Result Date: 10/05/2022 Severe first MTP narrowing and spurring was noted.  PIP and DIP narrowing was noted.  No intertarsal, tibiotalar or subtalar joint space narrowing was noted.  Inferior and posterior calcaneal spurs were noted.  No erosive changes were noted. Impression: These findings are consistent with osteoarthritis of the foot.  XR Foot 2 Views Left  Result Date: 10/05/2022 Severe first MTP, PIP and DIP narrowing was noted.  Extra-articular calcification was noted next to the navicular.  No intertarsal, tibiotalar or subtalar joint space narrowing was noted.  Inferior and posterior calcaneal spurs were noted. Impression: These findings were consistent with osteoarthritis of the foot.  XR Pelvis 1-2 Views  Result Date: 10/05/2022 No SI joint to sclerosis or narrowing was noted.  Hip joint narrowing was noted. Impression: Unremarkable x-rays of the SI joints and hip joints.  XR Lumbar Spine 2-3 Views  Result Date: 10/05/2022 Multilevel spondylosis with anterior osteophytes was noted.  Narrowing between the  L4-L5 and L5-S1 was noted.  Facet joint arthropathy with foraminal narrowing was noted.  No syndesmophytes were noted. Impression: These findings are consistent with multilevel spondylosis and facet joint arthropathy.  XR Cervical Spine 2 or 3 views  Result Date: 10/05/2022 Multilevel spondylosis with anterior  osteophytes was noted.  Severe narrowing was noted between C5-C6 and C6-C7.  Facet joint arthropathy with foraminal narrowing was noted. Impression: These findings are consistent with severe degenerative disc disease of the cervical spine and facet joint arthropathy.   Recent Labs: Lab Results  Component Value Date   WBC 6.0 10/05/2022   HGB 14.3 10/05/2022   PLT 275 10/05/2022   NA 136 10/05/2022   K 4.2 10/05/2022   CL 96 (L) 10/05/2022   CO2 31 10/05/2022   GLUCOSE 100 (H) 10/05/2022   BUN 14 10/05/2022   CREATININE 0.82 10/05/2022   BILITOT 0.8 10/05/2022   ALKPHOS 46 09/22/2020   AST 20 10/05/2022   ALT 26 10/05/2022   PROT 7.0 10/05/2022   ALBUMIN 4.6 09/22/2020   CALCIUM 10.3 10/05/2022   GFRAA >60 11/21/2017   October 05, 2022 ANA 1: 80NH, RF negative, anti-CCP negative, HLA-B27 negative, C3-C4 normal, ESR 2, CK 47  Speciality Comments: No specialty comments available.  Procedures:  No procedures performed Allergies: Lisinopril and Metoprolol   Assessment / Plan:     Visit Diagnoses: Polyarthralgia -she complains of joint discomfort since she was in her 42s.  Primary osteoarthritis of both hands -she had bilateral CMC PIP and DIP thickening with incomplete fist formation.  She had inflammatory changes in several of her PIP joints.  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  X-ray findings were reviewed with the patient at length.  Use of topical Voltaren gel, occasional over-the-counter NSAIDs and Tylenol was discussed.  Tangential parameters were discussed.  I also discussed that she can come in for cortisone injections  as needed for pain and swelling in her joints.  All autoimmune workup was negative.  A handout on hand exercises was given.  Status post total knee replacement, bilateral -she continues to have some discomfort in her knee joints.  She had RTKR 2015, LTKR 2019 by Dr. Maxie Better.  A handout on lower extremity exercises was given.  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  She has tried physical therapy and orthotics.  Meloxicam was discontinued due to gastritis.  HLA-B27 negative.  She has calcaneal and plantar heel spurs.  Plantar fasciitis, bilateral - History of recurrent Planter fasciitis.  Primary osteoarthritis of both feet - Clinical and radiographic findings are consistent with severe osteoarthritis.  She was evaluated by Dr. Doran Durand and a podiatrist in the past.  DDD (degenerative disc disease), cervical - She had limited range of motion of the cervical spine.  She is followed by Dr. Rolena Infante.  X-rays showed severe degenerative disc disease and facet joint arthritis.  X-ray findings were reviewed with the patient.  A handout on neck and stretching exercises was given.  She has tried physical therapy in the past and has a traction machine.  DDD (degenerative disc disease), lumbar - Limited range of motion noted.  X-rays showed multilevel spondylosis and facet joint arthropathy.  X-ray findings were reviewed with the patient.  She continues to have some lower back pain.  She is followed by Dr. Maxie Better.  Core strengthening exercises were discussed and a handout was given.  She is also joining [employer].  I encouraged her to do water aerobics.  Chronic SI joint pain -she has mild tenderness over the SI joints.  X-rays of the SI joints were unremarkable.  Other medical problems are listed as follows:  Diverticulitis - Symptoms resolved after bowel resection.  She is followed by Dr. Lyndel Safe.  Primary  hypertension-blood pressure was elevated at 142/94.  She was advised  to monitor blood pressure closely and follow-up with her PCP.  Alcohol use - Drinks 10 glasses of wine per week.  Family history of psoriatic arthritis-Brother  Family history of ulcerative colitis-sister  Family history of rheumatoid arthritis-sister, maternal aunt  Family history of fibromyalgia-sister  Family history of osteoarthritis-father and siblings  Orders: No orders of the defined types were placed in this encounter.  No orders of the defined types were placed in this encounter.    Follow-Up Instructions: Return in about 6 months (around 04/26/2023) for Osteoarthritis.   Bo Merino, MD  Note - This record has been created using Editor, commissioning.  Chart creation errors have been sought, but may not always  have been located. Such creation errors do not reflect on  the standard of medical care.

## 2022-10-16 DIAGNOSIS — M79642 Pain in left hand: Secondary | ICD-10-CM | POA: Diagnosis not present

## 2022-10-16 DIAGNOSIS — M79641 Pain in right hand: Secondary | ICD-10-CM | POA: Diagnosis not present

## 2022-10-16 DIAGNOSIS — M79644 Pain in right finger(s): Secondary | ICD-10-CM | POA: Diagnosis not present

## 2022-10-16 DIAGNOSIS — M79645 Pain in left finger(s): Secondary | ICD-10-CM | POA: Diagnosis not present

## 2022-10-26 ENCOUNTER — Ambulatory Visit: Payer: PPO | Attending: Rheumatology | Admitting: Rheumatology

## 2022-10-26 ENCOUNTER — Encounter: Payer: Self-pay | Admitting: Rheumatology

## 2022-10-26 VITALS — BP 142/94 | HR 69 | Resp 16 | Ht 68.0 in | Wt 191.0 lb

## 2022-10-26 DIAGNOSIS — M19072 Primary osteoarthritis, left ankle and foot: Secondary | ICD-10-CM

## 2022-10-26 DIAGNOSIS — M2550 Pain in unspecified joint: Secondary | ICD-10-CM

## 2022-10-26 DIAGNOSIS — M722 Plantar fascial fibromatosis: Secondary | ICD-10-CM

## 2022-10-26 DIAGNOSIS — I10 Essential (primary) hypertension: Secondary | ICD-10-CM

## 2022-10-26 DIAGNOSIS — Z96653 Presence of artificial knee joint, bilateral: Secondary | ICD-10-CM | POA: Diagnosis not present

## 2022-10-26 DIAGNOSIS — G8929 Other chronic pain: Secondary | ICD-10-CM

## 2022-10-26 DIAGNOSIS — M533 Sacrococcygeal disorders, not elsewhere classified: Secondary | ICD-10-CM | POA: Diagnosis not present

## 2022-10-26 DIAGNOSIS — M5030 Other cervical disc degeneration, unspecified cervical region: Secondary | ICD-10-CM | POA: Diagnosis not present

## 2022-10-26 DIAGNOSIS — K5792 Diverticulitis of intestine, part unspecified, without perforation or abscess without bleeding: Secondary | ICD-10-CM

## 2022-10-26 DIAGNOSIS — Z789 Other specified health status: Secondary | ICD-10-CM

## 2022-10-26 DIAGNOSIS — Z8261 Family history of arthritis: Secondary | ICD-10-CM

## 2022-10-26 DIAGNOSIS — M19071 Primary osteoarthritis, right ankle and foot: Secondary | ICD-10-CM

## 2022-10-26 DIAGNOSIS — Z8269 Family history of other diseases of the musculoskeletal system and connective tissue: Secondary | ICD-10-CM

## 2022-10-26 DIAGNOSIS — M19042 Primary osteoarthritis, left hand: Secondary | ICD-10-CM

## 2022-10-26 DIAGNOSIS — Z8379 Family history of other diseases of the digestive system: Secondary | ICD-10-CM

## 2022-10-26 DIAGNOSIS — M19041 Primary osteoarthritis, right hand: Secondary | ICD-10-CM

## 2022-10-26 DIAGNOSIS — M5136 Other intervertebral disc degeneration, lumbar region: Secondary | ICD-10-CM | POA: Diagnosis not present

## 2022-10-26 DIAGNOSIS — Z840 Family history of diseases of the skin and subcutaneous tissue: Secondary | ICD-10-CM

## 2022-10-26 DIAGNOSIS — M7661 Achilles tendinitis, right leg: Secondary | ICD-10-CM

## 2022-10-26 DIAGNOSIS — M7662 Achilles tendinitis, left leg: Secondary | ICD-10-CM

## 2022-10-26 NOTE — Patient Instructions (Signed)
Cervical Strain and Sprain Rehab Ask your health care provider which exercises are safe for you. Do exercises exactly as told by your health care provider and adjust them as directed. It is normal to feel mild stretching, pulling, tightness, or discomfort as you do these exercises. Stop right away if you feel sudden pain or your pain gets worse. Do not begin these exercises until told by your health care provider. Stretching and range-of-motion exercises Cervical side bending  Using good posture, sit on a stable chair or stand up. Without moving your shoulders, slowly tilt your left / right ear to your shoulder until you feel a stretch in the neck muscles on the opposite side. You should be looking straight ahead. Hold for __________ seconds. Repeat with the other side of your neck. Repeat __________ times. Complete this exercise __________ times a day. Cervical rotation  Using good posture, sit on a stable chair or stand up. Slowly turn your head to the side as if you are looking over your left / right shoulder. Keep your eyes level with the ground. Stop when you feel a stretch along the side and the back of your neck. Hold for __________ seconds. Repeat this by turning to your other side. Repeat __________ times. Complete this exercise __________ times a day. Thoracic extension and pectoral stretch  Roll a towel or a small blanket so it is about 4 inches (10 cm) in diameter. Lie down on your back on a firm surface. Put the towel in the middle of your back across your spine. It should not be under your shoulder blades. Put your hands behind your head and let your elbows fall out to your sides. Hold for __________ seconds. Repeat __________ times. Complete this exercise __________ times a day. Strengthening exercises Upper cervical flexion  Lie on your back with a thin pillow behind your head or a small, rolled-up towel under your neck. Gently tuck your chin toward your chest and nod  your head down to look toward your feet. Do not lift your head off the pillow. Hold for __________ seconds. Release the tension slowly. Relax your neck muscles completely before you repeat this exercise. Repeat __________ times. Complete this exercise __________ times a day. Cervical extension  Stand about 6 inches (15 cm) away from a wall, with your back facing the wall. Place a soft object, about 6-8 inches (15-20 cm) in diameter, between the back of your head and the wall. A soft object could be a small pillow, a ball, or a folded towel. Gently tilt your head back and press into the soft object. Keep your jaw and forehead relaxed. Hold for __________ seconds. Release the tension slowly. Relax your neck muscles completely before you repeat this exercise. Repeat __________ times. Complete this exercise __________ times a day. Posture and body mechanics Body mechanics refer to the movements and positions of your body while you do your daily activities. Posture is part of body mechanics. Good posture and healthy body mechanics can help to relieve stress in your body's tissues and joints. Good posture means that your spine is in its natural S-curve position (your spine is neutral), your shoulders are pulled back slightly, and your head is not tipped forward. The following are general guidelines for using improved posture and body mechanics in your everyday activities. Sitting  When sitting, keep your spine neutral and keep your feet flat on the floor. Use a footrest, if needed, and keep your thighs parallel to the floor. Avoid rounding  your shoulders. Avoid tilting your head forward. When working at a desk or a computer, keep your desk at a height where your hands are slightly lower than your elbows. Slide your chair under your desk so you are close enough to maintain good posture. When working at a computer, place your monitor at a height where you are looking straight ahead and you do not have to  tilt your head forward or downward to look at the screen. Standing  When standing, keep your spine neutral and keep your feet about hip-width apart. Keep a slight bend in your knees. Your ears, shoulders, and hips should line up. When you do a task in which you stand in one place for a long time, place one foot up on a stable object that is 2-4 inches (5-10 cm) high, such as a footstool. This helps keep your spine neutral. Resting When lying down and resting, avoid positions that are most painful for you. Try to support your neck in a neutral position. You can use a contour pillow or a small rolled-up towel. Your pillow should support your neck but not push on it. This information is not intended to replace advice given to you by your health care provider. Make sure you discuss any questions you have with your health care provider. Document Revised: 03/26/2022 Document Reviewed: 03/26/2022 Elsevier Patient Education  [place]. Back Exercises The following exercises strengthen the muscles that help to support the trunk (torso) and back. They also help to keep the lower back flexible. Doing these exercises can help to prevent or lessen existing low back pain. If you have back pain or discomfort, try doing these exercises 2-3 times each day or as told by your health care provider. As your pain improves, do them once each day, but increase the number of times that you repeat the steps for each exercise (do more repetitions). To prevent the recurrence of back pain, continue to do these exercises once each day or as told by your health care provider. Do exercises exactly as told by your health care provider and adjust them as directed. It is normal to feel mild stretching, pulling, tightness, or discomfort as you do these exercises, but you should stop right away if you feel sudden pain or your pain gets worse. Exercises Single knee to chest Repeat these steps 3-5 times for each leg: Lie on  your back on a firm bed or the floor with your legs extended. Bring one knee to your chest. Your other leg should stay extended and in contact with the floor. Hold your knee in place by grabbing your knee or thigh with both hands and hold. Pull on your knee until you feel a gentle stretch in your lower back or buttocks. Hold the stretch for 10-30 seconds. Slowly release and straighten your leg.  Pelvic tilt Repeat these steps 5-10 times: Lie on your back on a firm bed or the floor with your legs extended. Bend your knees so they are pointing toward the ceiling and your feet are flat on the floor. Tighten your lower abdominal muscles to press your lower back against the floor. This motion will tilt your pelvis so your tailbone points up toward the ceiling instead of pointing to your feet or the floor. With gentle tension and even breathing, hold this position for 5-10 seconds.  Cat-cow Repeat these steps until your lower back becomes more flexible: Get into a hands-and-knees position on a firm bed or the floor.  Keep your hands under your shoulders, and keep your knees under your hips. You may place padding under your knees for comfort. Let your head hang down toward your chest. Contract your abdominal muscles and point your tailbone toward the floor so your lower back becomes rounded like the back of a cat. Hold this position for 5 seconds. Slowly lift your head, let your abdominal muscles relax, and point your tailbone up toward the ceiling so your back forms a sagging arch like the back of a cow. Hold this position for 5 seconds.  Press-ups Repeat these steps 5-10 times: Lie on your abdomen (face-down) on a firm bed or the floor. Place your palms near your head, about shoulder-width apart. Keeping your back as relaxed as possible and keeping your hips on the floor, slowly straighten your arms to raise the top half of your body and lift your shoulders. Do not use your back muscles to raise  your upper torso. You may adjust the placement of your hands to make yourself more comfortable. Hold this position for 5 seconds while you keep your back relaxed. Slowly return to lying flat on the floor.  Bridges Repeat these steps 10 times: Lie on your back on a firm bed or the floor. Bend your knees so they are pointing toward the ceiling and your feet are flat on the floor. Your arms should be flat at your sides, next to your body. Tighten your buttocks muscles and lift your buttocks off the floor until your waist is at almost the same height as your knees. You should feel the muscles working in your buttocks and the back of your thighs. If you do not feel these muscles, slide your feet 1-2 inches (2.5-5 cm) farther away from your buttocks. Hold this position for 3-5 seconds. Slowly lower your hips to the starting position, and allow your buttocks muscles to relax completely. If this exercise is too easy, try doing it with your arms crossed over your chest. Abdominal crunches Repeat these steps 5-10 times: Lie on your back on a firm bed or the floor with your legs extended. Bend your knees so they are pointing toward the ceiling and your feet are flat on the floor. Cross your arms over your chest. Tip your chin slightly toward your chest without bending your neck. Tighten your abdominal muscles and slowly raise your torso high enough to lift your shoulder blades a tiny bit off the floor. Avoid raising your torso higher than that because it can put too much stress on your lower back and does not help to strengthen your abdominal muscles. Slowly return to your starting position.  Back lifts Repeat these steps 5-10 times: Lie on your abdomen (face-down) with your arms at your sides, and rest your forehead on the floor. Tighten the muscles in your legs and your buttocks. Slowly lift your chest off the floor while you keep your hips pressed to the floor. Keep the back of your head in line  with the curve in your back. Your eyes should be looking at the floor. Hold this position for 3-5 seconds. Slowly return to your starting position.  Contact a health care provider if: Your back pain or discomfort gets much worse when you do an exercise. Your worsening back pain or discomfort does not lessen within 2 hours after you exercise. If you have any of these problems, stop doing these exercises right away. Do not do them again unless your health care provider says that you  can. Get help right away if: You develop sudden, severe back pain. If this happens, stop doing the exercises right away. Do not do them again unless your health care provider says that you can. This information is not intended to replace advice given to you by your health care provider. Make sure you discuss any questions you have with your health care provider. Document Revised: 02/28/2021 Document Reviewed: 11/16/2020 Elsevier Patient Education  [place]. Hand Exercises Hand exercises can be helpful for almost anyone. These exercises can strengthen the hands, improve flexibility and movement, and increase blood flow to the hands. These results can make work and daily tasks easier. Hand exercises can be especially helpful for people who have joint pain from arthritis or have nerve damage from overuse (carpal tunnel syndrome). These exercises can also help people who have injured a hand. Exercises Most of these hand exercises are gentle stretching and motion exercises. It is usually safe to do them often throughout the day. Warming up your hands before exercise may help to reduce stiffness. You can do this with gentle massage or by placing your hands in warm water for 10-15 minutes. It is normal to feel some stretching, pulling, tightness, or mild discomfort as you begin new exercises. This will gradually improve. Stop an exercise right away if you feel sudden, severe pain or your pain gets worse. Ask your  health care provider which exercises are best for you. Knuckle bend or "claw" fist  Stand or sit with your arm, hand, and all five fingers pointed straight up. Make sure to keep your wrist straight during the exercise. Gently bend your fingers down toward your palm until the tips of your fingers are touching the top of your palm. Keep your big knuckle straight and just bend the small knuckles in your fingers. Hold this position for __________ seconds. Straighten (extend) your fingers back to the starting position. Repeat this exercise 5-10 times with each hand. Full finger fist  Stand or sit with your arm, hand, and all five fingers pointed straight up. Make sure to keep your wrist straight during the exercise. Gently bend your fingers into your palm until the tips of your fingers are touching the middle of your palm. Hold this position for __________ seconds. Extend your fingers back to the starting position, stretching every joint fully. Repeat this exercise 5-10 times with each hand. Straight fist Stand or sit with your arm, hand, and all five fingers pointed straight up. Make sure to keep your wrist straight during the exercise. Gently bend your fingers at the big knuckle, where your fingers meet your hand, and the middle knuckle. Keep the knuckle at the tips of your fingers straight and try to touch the bottom of your palm. Hold this position for __________ seconds. Extend your fingers back to the starting position, stretching every joint fully. Repeat this exercise 5-10 times with each hand. Tabletop  Stand or sit with your arm, hand, and all five fingers pointed straight up. Make sure to keep your wrist straight during the exercise. Gently bend your fingers at the big knuckle, where your fingers meet your hand, as far down as you can while keeping the small knuckles in your fingers straight. Think of forming a tabletop with your fingers. Hold this position for __________  seconds. Extend your fingers back to the starting position, stretching every joint fully. Repeat this exercise 5-10 times with each hand. Finger spread  Place your hand flat on a table with  your palm facing down. Make sure your wrist stays straight as you do this exercise. Spread your fingers and thumb apart from each other as far as you can until you feel a gentle stretch. Hold this position for __________ seconds. Bring your fingers and thumb tight together again. Hold this position for __________ seconds. Repeat this exercise 5-10 times with each hand. Making circles  Stand or sit with your arm, hand, and all five fingers pointed straight up. Make sure to keep your wrist straight during the exercise. Make a circle by touching the tip of your thumb to the tip of your index finger. Hold for __________ seconds. Then open your hand wide. Repeat this motion with your thumb and each finger on your hand. Repeat this exercise 5-10 times with each hand. Thumb motion  Sit with your forearm resting on a table and your wrist straight. Your thumb should be facing up toward the ceiling. Keep your fingers relaxed as you move your thumb. Lift your thumb up as high as you can toward the ceiling. Hold for __________ seconds. Bend your thumb across your palm as far as you can, reaching the tip of your thumb for the small finger (pinkie) side of your palm. Hold for __________ seconds. Repeat this exercise 5-10 times with each hand. Grip strengthening  Hold a stress ball or other soft ball in the middle of your hand. Slowly increase the pressure, squeezing the ball as much as you can without causing pain. Think of bringing the tips of your fingers into the middle of your palm. All of your finger joints should bend when doing this exercise. Hold your squeeze for __________ seconds, then relax. Repeat this exercise 5-10 times with each hand. Contact a health care provider if: Your hand pain or discomfort  gets much worse when you do an exercise. Your hand pain or discomfort does not improve within 2 hours after you exercise. If you have any of these problems, stop doing these exercises right away. Do not do them again unless your health care provider says that you can. Get help right away if: You develop sudden, severe hand pain or swelling. If this happens, stop doing these exercises right away. Do not do them again unless your health care provider says that you can. This information is not intended to replace advice given to you by your health care provider. Make sure you discuss any questions you have with your health care provider. Document Revised: 12/22/2020 Document Reviewed: 12/22/2020 Elsevier Patient Education  [place].   Exercises for Chronic Knee Pain Chronic knee pain is pain that lasts longer than 3 months. For most people with chronic knee pain, exercise and weight loss is an important part of treatment. Your health care provider may want you to focus on: Strengthening the muscles that support your knee. This can take pressure off your knee and lessen pain. Preventing knee stiffness. Maintaining or increasing how far you can move your knee. Losing weight (if this applies) to take pressure off your knee, decrease your risk for injury, and make it easier for you to exercise. Your health care provider will help you develop an exercise program that matches your needs and physical abilities. Below are simple, low-impact exercises you can do at home. Ask your health care provider or a physical therapist how often you should do your exercise program and how many times to repeat each exercise. General safety tips Follow these safety tips for exercising with chronic knee pain:  Get your health care provider's approval before doing any exercises. Start slowly and stop any time an exercise causes pain. Do not exercise if your knee pain is flaring up. Warm up first. Stretching a  cold muscle can cause an injury. Do 5-10 minutes of easy movement or light stretching before beginning your exercise routine. Do 5-10 minutes of low-impact activity (like walking or cycling) before starting strengthening exercises. Contact your health care provider any time you have pain during or after exercising. Exercise may cause discomfort but should not be painful. It is normal to be a little stiff or sore after exercising.  Stretching and range-of-motion exercises Front thigh stretch  Stand up straight and support your body by holding on to a chair or resting one hand on a wall. With your legs straight and close together, bend one knee to lift your heel up toward your buttocks. Using one hand for support, grab your ankle with your free hand. Pull your foot up closer toward your buttocks to feel the stretch in front of your thigh. Hold the stretch for 30 seconds. Repeat __________ times. Complete this exercise __________ times a day. Back thigh stretch  Sit on the floor with your back straight and your legs out straight in front of you. Place the palms of your hands on the floor and slide them toward your feet as you bend at the hip. Try to touch your nose to your knees and feel the stretch in the back of your thighs. Hold for 30 seconds. Repeat __________ times. Complete this exercise __________ times a day. Calf stretch  Stand facing a wall. Place the palms of your hands flat against the wall, arms extended, and lean slightly against the wall. Get into a lunge position with one leg bent at the knee and the other leg stretched out straight behind you. Keep both feet facing the wall and increase the bend in your knee while keeping the heel of the other leg flat on the ground. You should feel the stretch in your calf. Hold for 30 seconds. Repeat __________ times. Complete this exercise __________ times a day. Strengthening exercises Straight leg lift Lie on your back with one knee  bent and the other leg out straight. Slowly lift the straight leg without bending the knee. Lift until your foot is about 12 inches (30 cm) off the floor. Hold for 3-5 seconds and slowly lower your leg. Repeat __________ times. Complete this exercise __________ times a day. Single leg dip Stand between two chairs and put both hands on the backs of the chairs for support. Extend one leg out straight with your body weight resting on the heel of the standing leg. Slowly bend your standing knee to dip your body to the level that is comfortable for you. Hold for 3-5 seconds. Repeat __________ times. Complete this exercise __________ times a day. Hamstring curls Stand straight, knees close together, facing the back of a chair. Hold on to the back of a chair with both hands. Keep one leg straight. Bend the other knee while bringing the heel up toward the buttock until the knee is bent at a 90-degree angle (right angle). Hold for 3-5 seconds. Repeat __________ times. Complete this exercise __________ times a day. Wall squat Stand straight with your back, hips, and head against a wall. Step forward one foot at a time with your back still against the wall. Your feet should be 2 feet (61 cm) from the wall at shoulder width. Keeping your  back, hips, and head against the wall, slide down the wall to as close of a sitting position as you can get. Hold for 5-10 seconds, then slowly slide back up. Repeat __________ times. Complete this exercise __________ times a day. Step-ups Step up with one foot onto a sturdy platform or stool that is about 6 inches (15 cm) high. Face sideways with one foot on the platform and one on the ground. Place all your weight on the platform foot and lift your body off the ground until your knee extends. Let your other leg hang free to the side. Hold for 3-5 seconds then slowly lower your weight down to the floor foot. Repeat __________ times. Complete this exercise  __________ times a day. Contact a health care provider if: Your exercise causes pain. Your pain is worse after you exercise. Your pain prevents you from doing your exercises. This information is not intended to replace advice given to you by your health care provider. Make sure you discuss any questions you have with your health care provider. Document Revised: 01/07/2020 Document Reviewed: 08/31/2019 Elsevier Patient Education  [place].

## 2022-12-04 DIAGNOSIS — Z6830 Body mass index (BMI) 30.0-30.9, adult: Secondary | ICD-10-CM | POA: Diagnosis not present

## 2022-12-04 DIAGNOSIS — E669 Obesity, unspecified: Secondary | ICD-10-CM | POA: Diagnosis not present

## 2022-12-04 DIAGNOSIS — I10 Essential (primary) hypertension: Secondary | ICD-10-CM | POA: Diagnosis not present

## 2022-12-04 DIAGNOSIS — E7800 Pure hypercholesterolemia, unspecified: Secondary | ICD-10-CM | POA: Diagnosis not present

## 2022-12-25 DIAGNOSIS — L430 Hypertrophic lichen planus: Secondary | ICD-10-CM | POA: Diagnosis not present

## 2022-12-25 DIAGNOSIS — L821 Other seborrheic keratosis: Secondary | ICD-10-CM | POA: Diagnosis not present

## 2022-12-25 DIAGNOSIS — D485 Neoplasm of uncertain behavior of skin: Secondary | ICD-10-CM | POA: Diagnosis not present

## 2022-12-25 DIAGNOSIS — L570 Actinic keratosis: Secondary | ICD-10-CM | POA: Diagnosis not present

## 2022-12-25 DIAGNOSIS — L814 Other melanin hyperpigmentation: Secondary | ICD-10-CM | POA: Diagnosis not present

## 2022-12-25 DIAGNOSIS — Z85828 Personal history of other malignant neoplasm of skin: Secondary | ICD-10-CM | POA: Diagnosis not present

## 2023-01-18 ENCOUNTER — Encounter: Payer: PPO | Admitting: Rheumatology

## 2023-02-04 DIAGNOSIS — Z973 Presence of spectacles and contact lenses: Secondary | ICD-10-CM | POA: Diagnosis not present

## 2023-02-04 DIAGNOSIS — H5203 Hypermetropia, bilateral: Secondary | ICD-10-CM | POA: Diagnosis not present

## 2023-02-04 DIAGNOSIS — H25813 Combined forms of age-related cataract, bilateral: Secondary | ICD-10-CM | POA: Diagnosis not present

## 2023-04-08 DIAGNOSIS — E663 Overweight: Secondary | ICD-10-CM | POA: Diagnosis not present

## 2023-04-08 DIAGNOSIS — Z0000 Encounter for general adult medical examination without abnormal findings: Secondary | ICD-10-CM | POA: Diagnosis not present

## 2023-04-08 DIAGNOSIS — E7800 Pure hypercholesterolemia, unspecified: Secondary | ICD-10-CM | POA: Diagnosis not present

## 2023-04-08 DIAGNOSIS — Z96653 Presence of artificial knee joint, bilateral: Secondary | ICD-10-CM | POA: Diagnosis not present

## 2023-04-08 DIAGNOSIS — R072 Precordial pain: Secondary | ICD-10-CM | POA: Diagnosis not present

## 2023-04-08 DIAGNOSIS — Z6829 Body mass index (BMI) 29.0-29.9, adult: Secondary | ICD-10-CM | POA: Diagnosis not present

## 2023-04-08 DIAGNOSIS — N951 Menopausal and female climacteric states: Secondary | ICD-10-CM | POA: Diagnosis not present

## 2023-04-08 DIAGNOSIS — Z1389 Encounter for screening for other disorder: Secondary | ICD-10-CM | POA: Diagnosis not present

## 2023-04-08 DIAGNOSIS — M150 Primary generalized (osteo)arthritis: Secondary | ICD-10-CM | POA: Diagnosis not present

## 2023-04-08 DIAGNOSIS — I10 Essential (primary) hypertension: Secondary | ICD-10-CM | POA: Diagnosis not present

## 2023-04-08 DIAGNOSIS — Z713 Dietary counseling and surveillance: Secondary | ICD-10-CM | POA: Diagnosis not present

## 2023-04-23 NOTE — Progress Notes (Unsigned)
Office Visit Note  Patient: Laura Hahn             Date of Birth: 02/13/1951           MRN: [id number]             PCP: Alinda Deem, MD Referring: Alinda Deem, MD Visit Date: 05/07/2023 Occupation: @GUAROCC @  Subjective:  Pain in multiple joints   History of Present Illness: Laura Hahn is a 71 y.o. female with history of osteoarthritis and DDD.  Patient continues to have chronic pain and stiffness involving multiple joints.  Her discomfort and stiffness have been most severe on the C-spine as well as both hands.  She is having soreness and tenderness of both CMC joints as well as stiffness in her hands.  She has incomplete fist formation and has noticed more difficulty with gripping and fine motor skills.  Patient states that she tried hand therapy but discontinued due to being able to complete most of the tasks at home.  She has a paraffin wax tab which she uses as well as arthritis compression gloves.  She takes Aleve sparingly for pain relief.  Patient states that she has noticed more limitation in range of motion with her C-spine especially while driving.  Patient previously tried physical therapy for her neck.  In the past she has tried taking Flexeril for muscle spasms.  She is currently having muscle spasms in her lower back.  Patient states that both knee replacements are doing well overall.  She has been trying to walk 2 miles daily for exercise.    Activities of Daily Living:  Patient reports joint stiffness all day  Patient Reports nocturnal pain.  Difficulty dressing/grooming: Reports Difficulty climbing stairs: Denies Difficulty getting out of chair: Reports Difficulty using hands for taps, buttons, cutlery, and/or writing: Reports  Review of Systems  Constitutional:  Positive for fatigue.  HENT:  Negative for mouth sores and mouth dryness.   Eyes:  Positive for dryness.  Respiratory:  Negative for shortness of breath.   Cardiovascular:  Negative  for chest pain and palpitations.  Gastrointestinal:  Positive for diarrhea. Negative for blood in stool and constipation.  Endocrine: Negative for increased urination.  Genitourinary:  Negative for involuntary urination.  Musculoskeletal:  Positive for joint pain, joint pain, joint swelling, myalgias, muscle weakness, morning stiffness, muscle tenderness and myalgias. Negative for gait problem.  Skin:  Negative for color change, rash, hair loss and sensitivity to sunlight.  Allergic/Immunologic: Negative for susceptible to infections.  Neurological:  Negative for dizziness and headaches.  Hematological:  Negative for swollen glands.  Psychiatric/Behavioral:  Positive for sleep disturbance. Negative for depressed mood. The patient is nervous/anxious.     PMFS History:  Patient Active Problem List   Diagnosis Date Noted   Anesthesia to pain 05/02/2020   Neck stiffness 05/02/2020   Acquired short Achilles tendon of right lower extremity 10/28/2019   Achilles tendonitis, bilateral 10/28/2019   Posterior calcaneal exostosis 10/28/2019   Lump on finger 05/26/2018   Localized, primary osteoarthritis of hand 02/25/2018   History of total knee replacement, left 11/26/2017   Primary osteoarthritis of left knee 11/20/2017   Left knee DJD 11/20/2017   Diverticulitis 12/25/2016   History of vaginal hysterectomy 12/25/2016   Hypertension 12/13/2015   Osteoarthritis 12/13/2015   Hyponatremia 05/29/2014   Hypokalemia 05/29/2014   Abdominal pain, left lower quadrant 05/29/2014   Right knee DJD 05/27/2014    Past Medical History:  Diagnosis Date  Arthritis    neck   COVID-19 virus infection 04/2020   had both shots and got covid. been having diarrhea like 5 times a day. thought it could be some malabsoption   Diverticulosis    Elevated cholesterol    Family history of colon cancer    Family history of colonic polyps    History of kidney infection    Hypertension    states under control  with meds., has been on med. x 30 yr.   IBS (irritable bowel syndrome)    Mass of finger of left hand 06/2015   small finger   Osteoarthritis    PONV (postoperative nausea and vomiting)     Family History  Problem Relation Age of Onset   Osteoarthritis Mother    Diverticulitis Mother    Colon polyps Mother    Stroke Father    Dementia Father    Osteoarthritis Father    Osteoarthritis Sister    Fibromyalgia Sister    Rheum arthritis Sister    Osteoarthritis Brother    Osteoarthritis Brother    Colon cancer Maternal Aunt    Colon cancer Maternal Uncle    Pancreatic cancer Cousin        1st cousin   Pancreatic cancer Cousin    Colon cancer Maternal Great-grandmother    Healthy Son    Healthy Daughter    Esophageal cancer Neg Hx    Rectal cancer Neg Hx    Stomach cancer Neg Hx    Past Surgical History:  Procedure Laterality Date   ABDOMINAL HYSTERECTOMY     partial    APPENDECTOMY     BOWEL RESECTION  03/2017   Dr. Logan Bores [facility]   breast lift     COLONOSCOPY  08/22/2015   Moderate predominantly left diverticulosis. Otherwise normal colonoscopy to terminal ilium. The colon was highly redundant.    CYST EXCISION Right    great toe   FINGER GANGLION CYST EXCISION Right    GANGLION CYST EXCISION Left 08/2022   KNEE ARTHROSCOPY Right 05/28/2008   LIPOSUCTION     MASS EXCISION Left 07/04/2015   Procedure: EXCISION MASS LEFT SMALL FINGER ;  Surgeon: Louisa Second, MD;  Location: [facility];  Service: Plastics;  Laterality: Left;   MASTOPEXY  12/2014   SKIN FULL THICKNESS GRAFT Left 07/04/2015   Procedure: SKIN GRAFT FULL THICKNESS;  Surgeon: Louisa Second, MD;  Location: [facility];  Service: Plastics;  Laterality: Left;   TONSILLECTOMY     TOTAL KNEE ARTHROPLASTY Right 05/27/2014   Procedure: TOTAL RIGHT KNEE ARTHROPLASTY;  Surgeon: Javier Docker, MD;  Location: [facility];  Service: Orthopedics;  Laterality: Right;   TOTAL  KNEE ARTHROPLASTY Left 11/20/2017   Procedure: TOTAL LEFT KNEE ARTHROPLASTY;  Surgeon: Jene Every, MD;  Location: [facility];  Service: Orthopedics;  Laterality: Left;   WEDGE RESECTION     ovary   WISDOM TOOTH EXTRACTION     Social History   Social History Narrative   Not on file   Immunization History  Administered Date(s) Administered   Moderna Sars-Covid-2 Vaccination 12/13/2019     Objective: Vital Signs: BP 137/81 (BP Location: Left Arm, Patient Position: Sitting, Cuff Size: Normal)   Pulse 62   Resp 15   Ht 5\' 8"  (1.727 m)   Wt 194 lb (88 kg)   BMI 29.50 kg/m    Physical Exam Vitals and nursing note reviewed.  Constitutional:      Appearance:  She is well-developed.  HENT:     Head: Normocephalic and atraumatic.  Eyes:     Conjunctiva/sclera: Conjunctivae normal.  Cardiovascular:     Rate and Rhythm: Normal rate and regular rhythm.     Heart sounds: Normal heart sounds.  Pulmonary:     Effort: Pulmonary effort is normal.     Breath sounds: Normal breath sounds.  Abdominal:     General: Bowel sounds are normal.     Palpations: Abdomen is soft.  Musculoskeletal:     Cervical back: Normal range of motion.  Lymphadenopathy:     Cervical: No cervical adenopathy.  Skin:    General: Skin is warm and dry.     Capillary Refill: Capillary refill takes less than 2 seconds.  Neurological:     Mental Status: She is alert and oriented to person, place, and time.  Psychiatric:        Behavior: Behavior normal.      Musculoskeletal Exam: C-spine has limited lateral rotation bilaterally.  Discomfort and limited mobility of the lumbar spine.  Midline spinal tenderness in the thoracic and lumbar spine.  No SI joint tenderness upon palpation.  CMC joint thickening, tenderness, and subluxation bilaterally.  Severe PIP and DIP thickening consistent with osteoarthritis of both hands.  Subluxation of several PIP and DIP joints.  Incomplete fist formation.  Ankylosis of several  PIP and DIP joints.  Right hip has good range of motion.  Left hip has slightly limited range of motion.  Both knee replacements have good range of motion.  Ankle joints have good range of motion.  Thickening of PIP and DIP joints consistent with OA. Thickening of 1st MTP joint.  CDAI Exam: CDAI Score: -- Patient Global: --; Provider Global: -- Swollen: --; Tender: -- Joint Exam 05/07/2023   No joint exam has been documented for this visit   There is currently no information documented on the homunculus. Go to the Rheumatology activity and complete the homunculus joint exam.  Investigation: No additional findings.  Imaging: No results found.  Recent Labs: Lab Results  Component Value Date   WBC 6.0 10/05/2022   HGB 14.3 10/05/2022   PLT 275 10/05/2022   NA 136 10/05/2022   K 4.2 10/05/2022   CL 96 (L) 10/05/2022   CO2 31 10/05/2022   GLUCOSE 100 (H) 10/05/2022   BUN 14 10/05/2022   CREATININE 0.82 10/05/2022   BILITOT 0.8 10/05/2022   ALKPHOS 46 09/22/2020   AST 20 10/05/2022   ALT 26 10/05/2022   PROT 7.0 10/05/2022   ALBUMIN 4.6 09/22/2020   CALCIUM 10.3 10/05/2022   GFRAA >60 11/21/2017    Speciality Comments: No specialty comments available.  Procedures:  No procedures performed Allergies: Lisinopril and Metoprolol   Assessment / Plan:     Visit Diagnoses: Polyarthralgia: Patient has chronic pain involving multiple joints.  Her pain has been most severe in the C-spine and both hands.  She has been experiencing increased stiffness which has been interfering with her quality of life.  She takes Aleve very sparingly.  Discussed the use of natural anti-inflammatories.  She has tried physical therapy for both her neck as well as hand therapy with minimal relief.  She has continued home exercises. Discussed the possible use of Cymbalta for chronic pain relief.  She plans on discussing using Cymbalta with her PCP.  Discouraged the chronic use of NSAIDs due to  cardiovascular risks, GI side effects, and potential for kidney damage.  Positive ANA (antinuclear  antibody) - 10/05/22: ANA 1:80NH: Patient has no clinical features of systemic lupus at this time.  Primary osteoarthritis of both hands -anti-CCP negative, rheumatoid factor negative, sed rate within normal limits on 10/05/2022.  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  Erosive changes were noted.  Ankylosis of several DIP joints noted.  Incomplete fist formation bilaterally.  Patient has tenderness and thickening over both CMC joints with subluxation noted bilaterally.  Severe PIP and DIP thickening consistent with osteoarthritis of both hands.  Subluxation of several PIP and DIP joints. Patient has tried hand therapy with minimal to no improvement in her joint stiffness.  She is also tried paraffin wax at home as well as arthritis compression gloves.  Most of her pain is involves the CMC joints but she has stiffness in both hands which makes it difficult to perform fine motor skills. Discussed the list of natural anti-inflammatories including turmeric, tart cherry, ginger, and omega-3. Discussed the importance of joint protection and muscle strengthening. Discussed the use of Cymbalta to help manage some of the chronic pain she is experiencing.  Patient plans on further discussing the use of Cymbalta with her PCP at her upcoming office visit next week. Discussed the use of CMC joint braces.  Also discussed trying an ultrasound-guided [facility] joint injection in the future if needed.  Status post total knee replacement, bilateral: Performed by Dr. Shelle Iron.  Good range of motion of both knee replacements.  No effusion noted.  She has been walking 2 miles daily for exercise.   Achilles tendonitis, bilateral - recurrent Achilles tendinitis since 2019.tried PT and orthotics.  Meloxicam was d/c due to gastritis.  HLA-B27 negative. calcaneal and plantar heel spurs.  Plantar  fasciitis, bilateral: Not currently symptomatic.  Primary osteoarthritis of both feet - Clinical and radiographic findings are consistent with severe osteoarthritis.  She was evaluated by Dr. Victorino Dike and a podiatrist in the past.  DDD (degenerative disc disease), cervical - She is followed by Dr. Shon Baton.  X-rays showed severe degenerative disc disease and facet joint arthritis.  Patient has limited range of motion of the C-spine with lateral rotation.  Good flexion and extension noted.  She has noticed more limitation with lateral rotation especially while driving.  Patient previously tried physical therapy and home exercises but continues to have persistent discomfort. A handout of neck exercises was provided to the patient today.  DDD (degenerative disc disease), lumbar - X-rays showed multilevel spondylosis and facet joint arthropathy.  Patient continues to have chronic pain and stiffness in her lower back.  She is currently experiencing muscle spasms and has intermittent symptoms of left sided sciatica.   Patient was previously prescribed Flexeril and gabapentin but is not taking other medication at this time.  Discussed a possible prescription for methocarbamol but she plans on further discussing with her PCP at her upcoming appointment next week.  Chronic SI joint pain: No SI joint tenderness upon palpation today.  Other medical conditions are listed as follows:  Diverticulitis - Symptoms resolved after bowel resection.  She is followed by Dr. Chales Abrahams.  Primary hypertension: Blood pressure was 137/81 today in the office.  Alcohol use  Family history of psoriatic arthritis-Brother  Family history of ulcerative colitis-sister  Family history of rheumatoid arthritis-sister, maternal aunt  Family history of fibromyalgia-sister  Family history of osteoarthritis-father and siblings  Orders: No orders of the defined types were placed in this encounter.  No orders of the defined types  were placed in this encounter.  Follow-Up Instructions: Return in about 6 months (around 11/07/2023) for Osteoarthritis, DDD.   Gearldine Bienenstock, PA-C  Note - This record has been created using Dragon software.  Chart creation errors have been sought, but may not always  have been located. Such creation errors do not reflect on  the standard of medical care.

## 2023-05-07 ENCOUNTER — Ambulatory Visit: Payer: PPO | Attending: Physician Assistant | Admitting: Physician Assistant

## 2023-05-07 ENCOUNTER — Encounter: Payer: Self-pay | Admitting: Physician Assistant

## 2023-05-07 VITALS — BP 137/81 | HR 62 | Resp 15 | Ht 68.0 in | Wt 194.0 lb

## 2023-05-07 DIAGNOSIS — R768 Other specified abnormal immunological findings in serum: Secondary | ICD-10-CM

## 2023-05-07 DIAGNOSIS — K5792 Diverticulitis of intestine, part unspecified, without perforation or abscess without bleeding: Secondary | ICD-10-CM | POA: Diagnosis not present

## 2023-05-07 DIAGNOSIS — Z8379 Family history of other diseases of the digestive system: Secondary | ICD-10-CM

## 2023-05-07 DIAGNOSIS — M19042 Primary osteoarthritis, left hand: Secondary | ICD-10-CM

## 2023-05-07 DIAGNOSIS — M2550 Pain in unspecified joint: Secondary | ICD-10-CM | POA: Diagnosis not present

## 2023-05-07 DIAGNOSIS — Z96653 Presence of artificial knee joint, bilateral: Secondary | ICD-10-CM | POA: Diagnosis not present

## 2023-05-07 DIAGNOSIS — M19041 Primary osteoarthritis, right hand: Secondary | ICD-10-CM

## 2023-05-07 DIAGNOSIS — M533 Sacrococcygeal disorders, not elsewhere classified: Secondary | ICD-10-CM | POA: Diagnosis not present

## 2023-05-07 DIAGNOSIS — M722 Plantar fascial fibromatosis: Secondary | ICD-10-CM | POA: Diagnosis not present

## 2023-05-07 DIAGNOSIS — M7661 Achilles tendinitis, right leg: Secondary | ICD-10-CM

## 2023-05-07 DIAGNOSIS — Z840 Family history of diseases of the skin and subcutaneous tissue: Secondary | ICD-10-CM

## 2023-05-07 DIAGNOSIS — Z8261 Family history of arthritis: Secondary | ICD-10-CM

## 2023-05-07 DIAGNOSIS — M5136 Other intervertebral disc degeneration, lumbar region: Secondary | ICD-10-CM | POA: Diagnosis not present

## 2023-05-07 DIAGNOSIS — I10 Essential (primary) hypertension: Secondary | ICD-10-CM

## 2023-05-07 DIAGNOSIS — M19072 Primary osteoarthritis, left ankle and foot: Secondary | ICD-10-CM

## 2023-05-07 DIAGNOSIS — Z8269 Family history of other diseases of the musculoskeletal system and connective tissue: Secondary | ICD-10-CM

## 2023-05-07 DIAGNOSIS — M7662 Achilles tendinitis, left leg: Secondary | ICD-10-CM

## 2023-05-07 DIAGNOSIS — M19071 Primary osteoarthritis, right ankle and foot: Secondary | ICD-10-CM

## 2023-05-07 DIAGNOSIS — M5030 Other cervical disc degeneration, unspecified cervical region: Secondary | ICD-10-CM

## 2023-05-07 DIAGNOSIS — Z789 Other specified health status: Secondary | ICD-10-CM

## 2023-05-07 DIAGNOSIS — G8929 Other chronic pain: Secondary | ICD-10-CM

## 2023-05-07 NOTE — Patient Instructions (Addendum)
Robaxin (muscle relaxer)  Cymbalta   CMC joint brace    Neck Exercises Ask your health care provider which exercises are safe for you. Do exercises exactly as told by your health care provider and adjust them as directed. It is normal to feel mild stretching, pulling, tightness, or discomfort as you do these exercises. Stop right away if you feel sudden pain or your pain gets worse. Do not begin these exercises until told by your health care provider. Neck exercises can be important for many reasons. They can improve strength and maintain flexibility in your neck, which will help your upper back and prevent neck pain. Stretching exercises Rotation neck stretching  Sit in a chair or stand up. Place your feet flat on the floor, shoulder-width apart. Slowly turn your head (rotate) to the right until a slight stretch is felt. Turn it all the way to the right so you can look over your right shoulder. Do not tilt or tip your head. Hold this position for 10-30 seconds. Slowly turn your head (rotate) to the left until a slight stretch is felt. Turn it all the way to the left so you can look over your left shoulder. Do not tilt or tip your head. Hold this position for 10-30 seconds. Repeat __________ times. Complete this exercise __________ times a day. Neck retraction  Sit in a sturdy chair or stand up. Look straight ahead. Do not bend your neck. Use your fingers to push your chin backward (retraction). Do not bend your neck for this movement. Continue to face straight ahead. If you are doing the exercise properly, you will feel a slight sensation in your throat and a stretch at the back of your neck. Hold the stretch for 1-2 seconds. Repeat __________ times. Complete this exercise __________ times a day. Strengthening exercises Neck press  Lie on your back on a firm bed or on the floor with a pillow under your head. Use your neck muscles to push your head down on the pillow and straighten your  spine. Hold the position as well as you can. Keep your head facing up (in a neutral position) and your chin tucked. Slowly count to 5 while holding this position. Repeat __________ times. Complete this exercise __________ times a day. Isometrics These are exercises in which you strengthen the muscles in your neck while keeping your neck still (isometrics). Sit in a supportive chair and place your hand on your forehead. Keep your head and face facing straight ahead. Do not flex or extend your neck while doing isometrics. Push forward with your head and neck while pushing back with your hand. Hold for 10 seconds. Do the sequence again, this time putting your hand against the back of your head. Use your head and neck to push backward against the hand pressure. Finally, do the same exercise on either side of your head, pushing sideways against the pressure of your hand. Repeat __________ times. Complete this exercise __________ times a day. Prone head lifts  Lie face-down (prone position), resting on your elbows so that your chest and upper back are raised. Start with your head facing downward, near your chest. Position your chin either on or near your chest. Slowly lift your head upward. Lift until you are looking straight ahead. Then continue lifting your head as far back as you can comfortably stretch. Hold your head up for 5 seconds. Then slowly lower it to your starting position. Repeat __________ times. Complete this exercise __________ times a day.  Supine head lifts  Lie on your back (supine position), bending your knees to point to the ceiling and keeping your feet flat on the floor. Lift your head slowly off the floor, raising your chin toward your chest. Hold for 5 seconds. Repeat __________ times. Complete this exercise __________ times a day. Scapular retraction  Stand with your arms at your sides. Look straight ahead. Slowly pull both shoulders (scapulae) backward and downward  (retraction) until you feel a stretch between your shoulder blades in your upper back. Hold for 10-30 seconds. Relax and repeat. Repeat __________ times. Complete this exercise __________ times a day. Contact a health care provider if: Your neck pain or discomfort gets worse when you do an exercise. Your neck pain or discomfort does not improve within 2 hours after you exercise. If you have any of these problems, stop exercising right away. Do not do the exercises again unless your health care provider says that you can. Get help right away if: You develop sudden, severe neck pain. If this happens, stop exercising right away. Do not do the exercises again unless your health care provider says that you can. This information is not intended to replace advice given to you by your health care provider. Make sure you discuss any questions you have with your health care provider. Document Revised: 02/28/2021 Document Reviewed: 02/28/2021 Elsevier Patient Education  2024 [employer].

## 2023-05-14 DIAGNOSIS — I10 Essential (primary) hypertension: Secondary | ICD-10-CM | POA: Diagnosis not present

## 2023-06-05 DIAGNOSIS — M8589 Other specified disorders of bone density and structure, multiple sites: Secondary | ICD-10-CM | POA: Diagnosis not present

## 2023-06-05 DIAGNOSIS — Z0189 Encounter for other specified special examinations: Secondary | ICD-10-CM | POA: Diagnosis not present

## 2023-06-25 DIAGNOSIS — Z6829 Body mass index (BMI) 29.0-29.9, adult: Secondary | ICD-10-CM | POA: Diagnosis not present

## 2023-06-25 DIAGNOSIS — E663 Overweight: Secondary | ICD-10-CM | POA: Diagnosis not present

## 2023-06-25 DIAGNOSIS — Z713 Dietary counseling and surveillance: Secondary | ICD-10-CM | POA: Diagnosis not present

## 2023-06-25 DIAGNOSIS — I10 Essential (primary) hypertension: Secondary | ICD-10-CM | POA: Diagnosis not present

## 2023-06-25 DIAGNOSIS — Z23 Encounter for immunization: Secondary | ICD-10-CM | POA: Diagnosis not present

## 2023-06-27 DIAGNOSIS — D225 Melanocytic nevi of trunk: Secondary | ICD-10-CM | POA: Diagnosis not present

## 2023-06-27 DIAGNOSIS — D2261 Melanocytic nevi of right upper limb, including shoulder: Secondary | ICD-10-CM | POA: Diagnosis not present

## 2023-06-27 DIAGNOSIS — L814 Other melanin hyperpigmentation: Secondary | ICD-10-CM | POA: Diagnosis not present

## 2023-06-27 DIAGNOSIS — L821 Other seborrheic keratosis: Secondary | ICD-10-CM | POA: Diagnosis not present

## 2023-06-27 DIAGNOSIS — L570 Actinic keratosis: Secondary | ICD-10-CM | POA: Diagnosis not present

## 2023-06-27 DIAGNOSIS — Z85828 Personal history of other malignant neoplasm of skin: Secondary | ICD-10-CM | POA: Diagnosis not present

## 2023-06-27 DIAGNOSIS — D2262 Melanocytic nevi of left upper limb, including shoulder: Secondary | ICD-10-CM | POA: Diagnosis not present

## 2023-08-05 DIAGNOSIS — I10 Essential (primary) hypertension: Secondary | ICD-10-CM | POA: Diagnosis not present

## 2023-09-02 DIAGNOSIS — K5732 Diverticulitis of large intestine without perforation or abscess without bleeding: Secondary | ICD-10-CM | POA: Diagnosis not present

## 2023-09-05 DIAGNOSIS — R109 Unspecified abdominal pain: Secondary | ICD-10-CM | POA: Diagnosis not present

## 2023-09-05 DIAGNOSIS — Z79899 Other long term (current) drug therapy: Secondary | ICD-10-CM | POA: Diagnosis not present

## 2023-09-05 DIAGNOSIS — R197 Diarrhea, unspecified: Secondary | ICD-10-CM | POA: Diagnosis not present

## 2023-09-05 DIAGNOSIS — E871 Hypo-osmolality and hyponatremia: Secondary | ICD-10-CM | POA: Diagnosis not present

## 2023-09-05 DIAGNOSIS — R1031 Right lower quadrant pain: Secondary | ICD-10-CM | POA: Diagnosis not present

## 2023-09-06 DIAGNOSIS — R1084 Generalized abdominal pain: Secondary | ICD-10-CM | POA: Diagnosis not present

## 2023-09-06 DIAGNOSIS — R109 Unspecified abdominal pain: Secondary | ICD-10-CM | POA: Diagnosis not present

## 2023-09-06 DIAGNOSIS — E871 Hypo-osmolality and hyponatremia: Secondary | ICD-10-CM | POA: Diagnosis not present

## 2023-09-07 DIAGNOSIS — R109 Unspecified abdominal pain: Secondary | ICD-10-CM | POA: Diagnosis not present

## 2023-09-07 DIAGNOSIS — I700 Atherosclerosis of aorta: Secondary | ICD-10-CM | POA: Diagnosis not present

## 2023-09-07 DIAGNOSIS — E878 Other disorders of electrolyte and fluid balance, not elsewhere classified: Secondary | ICD-10-CM | POA: Diagnosis not present

## 2023-09-07 DIAGNOSIS — N132 Hydronephrosis with renal and ureteral calculous obstruction: Secondary | ICD-10-CM | POA: Diagnosis not present

## 2023-09-07 DIAGNOSIS — R1031 Right lower quadrant pain: Secondary | ICD-10-CM | POA: Diagnosis not present

## 2023-09-07 DIAGNOSIS — R1011 Right upper quadrant pain: Secondary | ICD-10-CM | POA: Diagnosis not present

## 2023-09-07 DIAGNOSIS — E871 Hypo-osmolality and hyponatremia: Secondary | ICD-10-CM | POA: Diagnosis not present

## 2023-09-07 DIAGNOSIS — R197 Diarrhea, unspecified: Secondary | ICD-10-CM | POA: Diagnosis not present

## 2023-09-08 DIAGNOSIS — R109 Unspecified abdominal pain: Secondary | ICD-10-CM | POA: Diagnosis not present

## 2023-10-22 DIAGNOSIS — Z6830 Body mass index (BMI) 30.0-30.9, adult: Secondary | ICD-10-CM | POA: Diagnosis not present

## 2023-10-22 DIAGNOSIS — E7800 Pure hypercholesterolemia, unspecified: Secondary | ICD-10-CM | POA: Diagnosis not present

## 2023-10-22 DIAGNOSIS — E669 Obesity, unspecified: Secondary | ICD-10-CM | POA: Diagnosis not present

## 2023-10-22 DIAGNOSIS — Z79899 Other long term (current) drug therapy: Secondary | ICD-10-CM | POA: Diagnosis not present

## 2023-10-22 DIAGNOSIS — Z713 Dietary counseling and surveillance: Secondary | ICD-10-CM | POA: Diagnosis not present

## 2023-10-22 DIAGNOSIS — I10 Essential (primary) hypertension: Secondary | ICD-10-CM | POA: Diagnosis not present

## 2023-10-23 NOTE — Progress Notes (Deleted)
 Office Visit Note  Patient: Laura Hahn             Date of Birth: 12-23-1950           MRN: [id number]             PCP: Alinda Deem, MD Referring: Alinda Deem, MD Visit Date: 11/06/2023 Occupation: @GUAROCC @  Subjective:  No chief complaint on file.   History of Present Illness: Laura Hahn is a 73 y.o. female ***     Activities of Daily Living:  Patient reports morning stiffness for *** {minute/hour:19697}.   Patient {ACTIONS;DENIES/REPORTS:21021675::"Denies"} nocturnal pain.  Difficulty dressing/grooming: {ACTIONS;DENIES/REPORTS:21021675::"Denies"} Difficulty climbing stairs: {ACTIONS;DENIES/REPORTS:21021675::"Denies"} Difficulty getting out of chair: {ACTIONS;DENIES/REPORTS:21021675::"Denies"} Difficulty using hands for taps, buttons, cutlery, and/or writing: {ACTIONS;DENIES/REPORTS:21021675::"Denies"}  No Rheumatology ROS completed.   PMFS History:  Patient Active Problem List   Diagnosis Date Noted   Anesthesia to pain 05/02/2020   Neck stiffness 05/02/2020   Acquired short Achilles tendon of right lower extremity 10/28/2019   Achilles tendonitis, bilateral 10/28/2019   Posterior calcaneal exostosis 10/28/2019   Lump on finger 05/26/2018   Localized, primary osteoarthritis of hand 02/25/2018   History of total knee replacement, left 11/26/2017   Primary osteoarthritis of left knee 11/20/2017   Left knee DJD 11/20/2017   Diverticulitis 12/25/2016   History of vaginal hysterectomy 12/25/2016   Hypertension 12/13/2015   Osteoarthritis 12/13/2015   Hyponatremia 05/29/2014   Hypokalemia 05/29/2014   Abdominal pain, left lower quadrant 05/29/2014   Right knee DJD 05/27/2014    Past Medical History:  Diagnosis Date   Arthritis    neck   COVID-19 virus infection 04/2020   had both shots and got covid. been having diarrhea like 5 times a day. thought it could be some malabsoption   Diverticulosis    Elevated cholesterol    Family history  of colon cancer    Family history of colonic polyps    History of kidney infection    Hypertension    states under control with meds., has been on med. x 30 yr.   IBS (irritable bowel syndrome)    Mass of finger of left hand 06/2015   small finger   Osteoarthritis    PONV (postoperative nausea and vomiting)     Family History  Problem Relation Age of Onset   Osteoarthritis Mother    Diverticulitis Mother    Colon polyps Mother    Stroke Father    Dementia Father    Osteoarthritis Father    Osteoarthritis Sister    Fibromyalgia Sister    Rheum arthritis Sister    Osteoarthritis Brother    Osteoarthritis Brother    Colon cancer Maternal Aunt    Colon cancer Maternal Uncle    Pancreatic cancer Cousin        1st cousin   Pancreatic cancer Cousin    Colon cancer Maternal Great-grandmother    Healthy Son    Healthy Daughter    Esophageal cancer Neg Hx    Rectal cancer Neg Hx    Stomach cancer Neg Hx    Past Surgical History:  Procedure Laterality Date   ABDOMINAL HYSTERECTOMY     partial    APPENDECTOMY     BOWEL RESECTION  03/2017   Dr. [facility]   breast lift     COLONOSCOPY  08/22/2015   Moderate predominantly left diverticulosis. Otherwise normal colonoscopy to terminal ilium. The colon was highly redundant.    CYST EXCISION Right  great toe   FINGER GANGLION CYST EXCISION Right    GANGLION CYST EXCISION Left 08/2022   KNEE ARTHROSCOPY Right 05/28/2008   LIPOSUCTION     MASS EXCISION Left 07/04/2015   Procedure: EXCISION MASS LEFT SMALL FINGER ;  Surgeon: Louisa Second, MD;  Location: [facility];  Service: Plastics;  Laterality: Left;   MASTOPEXY  12/2014   SKIN FULL THICKNESS GRAFT Left 07/04/2015   Procedure: SKIN GRAFT FULL THICKNESS;  Surgeon: Louisa Second, MD;  Location: [facility];  Service: Plastics;  Laterality: Left;   TONSILLECTOMY     TOTAL KNEE ARTHROPLASTY Right 05/27/2014   Procedure: TOTAL  RIGHT KNEE ARTHROPLASTY;  Surgeon: Javier Docker, MD;  Location: [facility];  Service: Orthopedics;  Laterality: Right;   TOTAL KNEE ARTHROPLASTY Left 11/20/2017   Procedure: TOTAL LEFT KNEE ARTHROPLASTY;  Surgeon: Jene Every, MD;  Location: [facility];  Service: Orthopedics;  Laterality: Left;   WEDGE RESECTION     ovary   WISDOM TOOTH EXTRACTION     Social History   Social History Narrative   Not on file   Immunization History  Administered Date(s) Administered   Moderna Sars-Covid-2 Vaccination 12/13/2019     Objective: Vital Signs: There were no vitals taken for this visit.   Physical Exam   Musculoskeletal Exam: ***  CDAI Exam: CDAI Score: -- Patient Global: --; Provider Global: -- Swollen: --; Tender: -- Joint Exam 11/06/2023   No joint exam has been documented for this visit   There is currently no information documented on the homunculus. Go to the Rheumatology activity and complete the homunculus joint exam.  Investigation: No additional findings.  Imaging: No results found.  Recent Labs: Lab Results  Component Value Date   WBC 6.0 10/05/2022   HGB 14.3 10/05/2022   PLT 275 10/05/2022   NA 136 10/05/2022   K 4.2 10/05/2022   CL 96 (L) 10/05/2022   CO2 31 10/05/2022   GLUCOSE 100 (H) 10/05/2022   BUN 14 10/05/2022   CREATININE 0.82 10/05/2022   BILITOT 0.8 10/05/2022   ALKPHOS 46 09/22/2020   AST 20 10/05/2022   ALT 26 10/05/2022   PROT 7.0 10/05/2022   ALBUMIN 4.6 09/22/2020   CALCIUM 10.3 10/05/2022   GFRAA >60 11/21/2017    Speciality Comments: No specialty comments available.  Procedures:  No procedures performed Allergies: Lisinopril and Metoprolol   Assessment / Plan:     Visit Diagnoses: Positive ANA (antinuclear antibody)  Primary osteoarthritis of both hands  Status post total knee replacement, bilateral  Achilles tendonitis, bilateral  Plantar fasciitis, bilateral  Primary osteoarthritis of both feet  DDD (degenerative  disc disease), cervical  Degeneration of intervertebral disc of lumbar region without discogenic back pain or lower extremity pain  Chronic SI joint pain  Diverticulitis  Primary hypertension  Alcohol use  Family history of psoriatic arthritis-Brother  Family history of ulcerative colitis-sister  Family history of rheumatoid arthritis-sister, maternal aunt  Family history of fibromyalgia-sister  Family history of osteoarthritis-father and siblings  Orders: No orders of the defined types were placed in this encounter.  No orders of the defined types were placed in this encounter.   Face-to-face time spent with patient was *** minutes. Greater than 50% of time was spent in counseling and coordination of care.  Follow-Up Instructions: No follow-ups on file.   Gearldine Bienenstock, PA-C  Note - This record has been created using Dragon software.  Chart creation errors have been  sought, but may not always  have been located. Such creation errors do not reflect on  the standard of medical care.

## 2023-11-06 ENCOUNTER — Ambulatory Visit: Payer: PPO | Admitting: Physician Assistant

## 2023-11-06 DIAGNOSIS — Z840 Family history of diseases of the skin and subcutaneous tissue: Secondary | ICD-10-CM

## 2023-11-06 DIAGNOSIS — G8929 Other chronic pain: Secondary | ICD-10-CM

## 2023-11-06 DIAGNOSIS — M19071 Primary osteoarthritis, right ankle and foot: Secondary | ICD-10-CM

## 2023-11-06 DIAGNOSIS — M51369 Other intervertebral disc degeneration, lumbar region without mention of lumbar back pain or lower extremity pain: Secondary | ICD-10-CM

## 2023-11-06 DIAGNOSIS — M722 Plantar fascial fibromatosis: Secondary | ICD-10-CM

## 2023-11-06 DIAGNOSIS — R768 Other specified abnormal immunological findings in serum: Secondary | ICD-10-CM

## 2023-11-06 DIAGNOSIS — M5030 Other cervical disc degeneration, unspecified cervical region: Secondary | ICD-10-CM

## 2023-11-06 DIAGNOSIS — Z8269 Family history of other diseases of the musculoskeletal system and connective tissue: Secondary | ICD-10-CM

## 2023-11-06 DIAGNOSIS — K5792 Diverticulitis of intestine, part unspecified, without perforation or abscess without bleeding: Secondary | ICD-10-CM

## 2023-11-06 DIAGNOSIS — M19041 Primary osteoarthritis, right hand: Secondary | ICD-10-CM

## 2023-11-06 DIAGNOSIS — I10 Essential (primary) hypertension: Secondary | ICD-10-CM

## 2023-11-06 DIAGNOSIS — Z96653 Presence of artificial knee joint, bilateral: Secondary | ICD-10-CM

## 2023-11-06 DIAGNOSIS — Z789 Other specified health status: Secondary | ICD-10-CM

## 2023-11-06 DIAGNOSIS — M7661 Achilles tendinitis, right leg: Secondary | ICD-10-CM

## 2023-11-06 DIAGNOSIS — Z8261 Family history of arthritis: Secondary | ICD-10-CM

## 2023-11-06 DIAGNOSIS — Z8379 Family history of other diseases of the digestive system: Secondary | ICD-10-CM

## 2023-11-07 ENCOUNTER — Ambulatory Visit: Payer: PPO | Admitting: Physician Assistant

## 2023-11-20 ENCOUNTER — Ambulatory Visit: Admitting: Podiatry

## 2023-11-20 DIAGNOSIS — L600 Ingrowing nail: Secondary | ICD-10-CM | POA: Diagnosis not present

## 2023-11-20 NOTE — Patient Instructions (Signed)

## 2023-11-20 NOTE — Progress Notes (Unsigned)
       Subjective:  Patient ID: Laura Hahn, female    DOB: 04/05/1951,  MRN: [id number]  Laura Hahn presents to clinic today for:  Chief Complaint  Patient presents with   Ingrown Toenail    Left great toe, bilateral borders with the medial  being the worst. Not infected but is sore to touch. Been hurting for several months getting worse in the last few weeks. Not diabetic and no anti coag.    Patient presents with above concern.  Denies pain to lateral nail border today.  Pain is localized to medial nail border.   PCP is Laura Deem, MD.  Allergies  Allergen Reactions   Lisinopril Cough   Metoprolol Rash   Review of Systems: Negative except as noted in the HPI.  Objective:  Laura Hahn is a pleasant 73 y.o. female in NAD. AAO x 3.  Vascular Examination: Capillary refill time is 3-5 seconds to toes bilateral. Palpable pedal pulses b/l LE. Digital hair present b/l. No pedal edema b/l. Skin temperature gradient WNL b/l. No varicosities b/l. No cyanosis or clubbing noted b/l.   Dermatological Examination: There is incurvation of the left hallux medial nail border nail border.  There is pain on palpation of the affected nail border.  No erythema or purulence noted.  Neurological Examination: Epicritic sensation intact.   Assessment/Plan: 1. Ingrown toenail    Discussed patient's condition today.  After obtaining patient consent, the left hallux was anesthetized with a 50:50 mixture of 1% lidocaine plain and 0.5% bupivacaine plain for a total of 3cc's administered.  Upon confirmation of anesthesia, a freer elevator was utilized to free the left hallux medial nail border from the nail bed.  The nail border was then avulsed proximal to the eponychium and removed in toto.  The area was inspected for any remaining spicules.  A chemical matrixectomy was performed with NaOH and neutralized with acetic acid solution.  Antibiotic ointment and a DSD were applied,  followed by a Coban dressing.  Patient tolerated the anesthetic and procedure well and will f/u in 2-3 weeks for recheck.  Patient given post-procedure instructions for daily 15-minute Epsom salt soaks, antibiotic ointment and daily use of Bandaids until toe starts to dry / form eschar.   Return in about 2 weeks (around 12/04/2023) for PNA recheck.   Laura Hahn, DPM, FACFAS [facility]     [address] [place], [place] [address]                Office [phone]  Fax [phone]

## 2023-12-04 ENCOUNTER — Ambulatory Visit: Admitting: Podiatry

## 2023-12-24 DIAGNOSIS — Z6829 Body mass index (BMI) 29.0-29.9, adult: Secondary | ICD-10-CM | POA: Diagnosis not present

## 2023-12-24 DIAGNOSIS — Z713 Dietary counseling and surveillance: Secondary | ICD-10-CM | POA: Diagnosis not present

## 2023-12-24 DIAGNOSIS — E663 Overweight: Secondary | ICD-10-CM | POA: Diagnosis not present

## 2024-01-27 DIAGNOSIS — E663 Overweight: Secondary | ICD-10-CM | POA: Diagnosis not present

## 2024-01-27 DIAGNOSIS — Z6828 Body mass index (BMI) 28.0-28.9, adult: Secondary | ICD-10-CM | POA: Diagnosis not present

## 2024-01-27 DIAGNOSIS — Z713 Dietary counseling and surveillance: Secondary | ICD-10-CM | POA: Diagnosis not present

## 2024-01-27 DIAGNOSIS — N3000 Acute cystitis without hematuria: Secondary | ICD-10-CM | POA: Diagnosis not present

## 2024-01-27 DIAGNOSIS — R300 Dysuria: Secondary | ICD-10-CM | POA: Diagnosis not present

## 2024-02-04 DIAGNOSIS — C44729 Squamous cell carcinoma of skin of left lower limb, including hip: Secondary | ICD-10-CM | POA: Diagnosis not present

## 2024-02-04 DIAGNOSIS — Z85828 Personal history of other malignant neoplasm of skin: Secondary | ICD-10-CM | POA: Diagnosis not present

## 2024-02-04 DIAGNOSIS — L570 Actinic keratosis: Secondary | ICD-10-CM | POA: Diagnosis not present

## 2024-02-04 DIAGNOSIS — D1801 Hemangioma of skin and subcutaneous tissue: Secondary | ICD-10-CM | POA: Diagnosis not present

## 2024-02-04 DIAGNOSIS — L814 Other melanin hyperpigmentation: Secondary | ICD-10-CM | POA: Diagnosis not present

## 2024-02-04 DIAGNOSIS — L821 Other seborrheic keratosis: Secondary | ICD-10-CM | POA: Diagnosis not present

## 2024-02-13 DIAGNOSIS — J069 Acute upper respiratory infection, unspecified: Secondary | ICD-10-CM | POA: Diagnosis not present

## 2024-02-14 DIAGNOSIS — J0100 Acute maxillary sinusitis, unspecified: Secondary | ICD-10-CM | POA: Diagnosis not present

## 2024-02-14 DIAGNOSIS — R070 Pain in throat: Secondary | ICD-10-CM | POA: Diagnosis not present

## 2024-02-26 DIAGNOSIS — H25813 Combined forms of age-related cataract, bilateral: Secondary | ICD-10-CM | POA: Diagnosis not present

## 2024-03-24 ENCOUNTER — Other Ambulatory Visit: Payer: Self-pay | Admitting: Obstetrics and Gynecology

## 2024-03-24 DIAGNOSIS — Z1231 Encounter for screening mammogram for malignant neoplasm of breast: Secondary | ICD-10-CM

## 2024-03-25 DIAGNOSIS — Z01818 Encounter for other preprocedural examination: Secondary | ICD-10-CM | POA: Diagnosis not present

## 2024-03-25 DIAGNOSIS — H52222 Regular astigmatism, left eye: Secondary | ICD-10-CM | POA: Diagnosis not present

## 2024-03-25 DIAGNOSIS — H25812 Combined forms of age-related cataract, left eye: Secondary | ICD-10-CM | POA: Diagnosis not present

## 2024-03-25 DIAGNOSIS — H25813 Combined forms of age-related cataract, bilateral: Secondary | ICD-10-CM | POA: Diagnosis not present

## 2024-03-31 DIAGNOSIS — Z713 Dietary counseling and surveillance: Secondary | ICD-10-CM | POA: Diagnosis not present

## 2024-03-31 DIAGNOSIS — M19049 Primary osteoarthritis, unspecified hand: Secondary | ICD-10-CM | POA: Diagnosis not present

## 2024-03-31 DIAGNOSIS — I10 Essential (primary) hypertension: Secondary | ICD-10-CM | POA: Diagnosis not present

## 2024-03-31 DIAGNOSIS — Z6826 Body mass index (BMI) 26.0-26.9, adult: Secondary | ICD-10-CM | POA: Diagnosis not present

## 2024-03-31 DIAGNOSIS — E7800 Pure hypercholesterolemia, unspecified: Secondary | ICD-10-CM | POA: Diagnosis not present

## 2024-04-03 ENCOUNTER — Ambulatory Visit: Admitting: Podiatry

## 2024-04-07 DIAGNOSIS — Z01419 Encounter for gynecological examination (general) (routine) without abnormal findings: Secondary | ICD-10-CM | POA: Diagnosis not present

## 2024-04-08 DIAGNOSIS — H2512 Age-related nuclear cataract, left eye: Secondary | ICD-10-CM | POA: Diagnosis not present

## 2024-04-08 DIAGNOSIS — H52202 Unspecified astigmatism, left eye: Secondary | ICD-10-CM | POA: Diagnosis not present

## 2024-04-08 DIAGNOSIS — H25812 Combined forms of age-related cataract, left eye: Secondary | ICD-10-CM | POA: Diagnosis not present

## 2024-04-08 DIAGNOSIS — H269 Unspecified cataract: Secondary | ICD-10-CM | POA: Diagnosis not present

## 2024-04-20 DIAGNOSIS — H25811 Combined forms of age-related cataract, right eye: Secondary | ICD-10-CM | POA: Diagnosis not present

## 2024-04-20 DIAGNOSIS — H52201 Unspecified astigmatism, right eye: Secondary | ICD-10-CM | POA: Diagnosis not present

## 2024-04-20 DIAGNOSIS — H2511 Age-related nuclear cataract, right eye: Secondary | ICD-10-CM | POA: Diagnosis not present

## 2024-04-20 DIAGNOSIS — H269 Unspecified cataract: Secondary | ICD-10-CM | POA: Diagnosis not present

## 2024-04-24 ENCOUNTER — Ambulatory Visit
Admission: RE | Admit: 2024-04-24 | Discharge: 2024-04-24 | Disposition: A | Discharge status: Home / Self Care | Source: Ambulatory Visit | Attending: Obstetrics and Gynecology | Admitting: Obstetrics and Gynecology

## 2024-04-24 DIAGNOSIS — Z1231 Encounter for screening mammogram for malignant neoplasm of breast: Secondary | ICD-10-CM | POA: Diagnosis not present

## 2024-05-06 ENCOUNTER — Ambulatory Visit: Admitting: Podiatry

## 2024-05-06 DIAGNOSIS — B351 Tinea unguium: Secondary | ICD-10-CM | POA: Diagnosis not present

## 2024-05-06 DIAGNOSIS — L603 Nail dystrophy: Secondary | ICD-10-CM | POA: Diagnosis not present

## 2024-05-06 NOTE — Progress Notes (Unsigned)
 Chief Complaint  Patient presents with   Nail Problem    Left Hallux nail is very lose from the nail bed. This is the same toe she had the ingrown procedure on in March. She said she coul not tell but feel like there may be some regrowth with it as well. Not diabetic, no anti coag.    HPI: 73 y.o. female presents today noting her left great toenail seems to be lifting in the central portion of it.  She underwent a PNA of the left great toenail in the past.  She does have polish on today so the coloration of the nail cannot be evaluated.  She is wondering if she has fungus.  She would like to start treating it.  Past Medical History:  Diagnosis Date   Arthritis    neck   COVID-19 virus infection 04/2020   had both shots and got covid. been having diarrhea like 5 times a day. thought it could be some malabsoption   Diverticulosis    Elevated cholesterol    Family history of colon cancer    Family history of colonic polyps    History of kidney infection    Hypertension    states under control with meds., has been on med. x 30 yr.   IBS (irritable bowel syndrome)    Mass of finger of left hand 06/2015   small finger   Osteoarthritis    PONV (postoperative nausea and vomiting)    Past Surgical History:  Procedure Laterality Date   ABDOMINAL HYSTERECTOMY     partial    APPENDECTOMY     BOWEL RESECTION  03/2017   Dr. [facility]   breast lift     COLONOSCOPY  08/22/2015   Moderate predominantly left diverticulosis. Otherwise normal colonoscopy to terminal ilium. The colon was highly redundant.    CYST EXCISION Right    great toe   FINGER GANGLION CYST EXCISION Right    GANGLION CYST EXCISION Left 08/2022   KNEE ARTHROSCOPY Right 05/28/2008   LIPOSUCTION     MASS EXCISION Left 07/04/2015   Procedure: EXCISION MASS LEFT SMALL FINGER ;  Surgeon: Elna Pick, MD;  Location: [facility];  Service: Plastics;  Laterality: Left;   MASTOPEXY  12/2014    SKIN FULL THICKNESS GRAFT Left 07/04/2015   Procedure: SKIN GRAFT FULL THICKNESS;  Surgeon: Elna Pick, MD;  Location: [facility];  Service: Plastics;  Laterality: Left;   TONSILLECTOMY     TOTAL KNEE ARTHROPLASTY Right 05/27/2014   Procedure: TOTAL RIGHT KNEE ARTHROPLASTY;  Surgeon: Reyes JAYSON Billing, MD;  Location: [facility];  Service: Orthopedics;  Laterality: Right;   TOTAL KNEE ARTHROPLASTY Left 11/20/2017   Procedure: TOTAL LEFT KNEE ARTHROPLASTY;  Surgeon: Billing Reyes, MD;  Location: [facility];  Service: Orthopedics;  Laterality: Left;   WEDGE RESECTION     ovary   WISDOM TOOTH EXTRACTION     Allergies  Allergen Reactions   Lisinopril Cough   Metoprolol Rash     Physical Exam: Left hallux nail is thickened with some central onycholysis along the distal aspect.  Color cannot be evaluated due to the nail polish present.  Minimal pain with compression of the nail.  No evidence of onychocryptosis.  Palpable pedal pulses  Assessment/Plan of Care: 1. Fungal nail infection   2. Nail dystrophy    Clippings of the left hallux nail were obtained and sent to [facility] labs for fungal nail culture.  If  we do end up starting treatment if this returns as fungal, she will need to schedule follow-up in approximately 3 months.   Awanda CHARM Imperial, DPM, FACFAS [facility]     [address] [place], [place] [address]                Office [phone]  Fax [phone]

## 2024-05-11 DIAGNOSIS — E663 Overweight: Secondary | ICD-10-CM | POA: Diagnosis not present

## 2024-05-11 DIAGNOSIS — Z6829 Body mass index (BMI) 29.0-29.9, adult: Secondary | ICD-10-CM | POA: Diagnosis not present

## 2024-05-11 DIAGNOSIS — Z0000 Encounter for general adult medical examination without abnormal findings: Secondary | ICD-10-CM | POA: Diagnosis not present

## 2024-05-11 DIAGNOSIS — Z96653 Presence of artificial knee joint, bilateral: Secondary | ICD-10-CM | POA: Diagnosis not present

## 2024-05-11 DIAGNOSIS — Z1389 Encounter for screening for other disorder: Secondary | ICD-10-CM | POA: Diagnosis not present

## 2024-05-20 ENCOUNTER — Ambulatory Visit: Payer: Self-pay | Admitting: Podiatry

## 2024-05-21 ENCOUNTER — Telehealth: Payer: Self-pay

## 2024-05-21 DIAGNOSIS — B351 Tinea unguium: Secondary | ICD-10-CM

## 2024-05-21 MED ORDER — CICLOPIROX 8 % EX SOLN: Freq: Every day | CUTANEOUS | 11 refills | Status: AC

## 2024-05-21 NOTE — Telephone Encounter (Signed)
 PA request received from [facility] pharmacy for ciclopirox  8% solution. PA submitted through CoverMyMeds and waiting on response.  ROMERO RATTLER  (Key: [facility]) Rx #: [phone]

## 2024-05-22 NOTE — Telephone Encounter (Signed)
 Ciclopirox  PA was senied because there is no documented trial and failure of 12 week supply of oral terbinafine.

## 2024-06-01 DIAGNOSIS — Z79899 Other long term (current) drug therapy: Secondary | ICD-10-CM | POA: Diagnosis not present

## 2024-06-01 NOTE — Telephone Encounter (Signed)
 I called and spoke with Dr. Jaylene office, they confirmed that they did receive the lab orders.

## 2024-06-01 NOTE — Telephone Encounter (Signed)
 Tully from the doctor's office states they still have not received the fax. It was sent to the correct number, but they're requesting it be re-faxed. Thank you.

## 2024-06-04 ENCOUNTER — Other Ambulatory Visit: Payer: Self-pay | Admitting: Podiatry

## 2024-06-04 MED ORDER — TERBINAFINE HCL 250 MG PO TABS: 250.0000 mg | ORAL_TABLET | Freq: Every day | ORAL | 0 refills | Status: AC

## 2024-06-04 NOTE — Progress Notes (Signed)
 Received patient's liver function test.  Her liver enzymes were within normal range so we will go ahead and send in the oral terbinafine  250 mg 1 tablet p.o. daily x 90 days.  Her health insurance would not approve the topical medication without first attempting oral medication.  The patient initially had hesitance and did not want to go on oral medication but states that since the insurance will not cover the topical, she did wish to proceed with going on the oral medication to resolve the toenail fungus.

## 2024-06-05 ENCOUNTER — Telehealth: Payer: Self-pay

## 2024-06-05 NOTE — Telephone Encounter (Signed)
 PA request received from [facility] for Terbinafine  HCl 250 mg tablet. PA submitted through CoverMyMeds and waiting on response.  Laura Hahn  (Key: [id number]) Rx #: [id number]

## 2024-06-09 NOTE — Telephone Encounter (Signed)
 There are no results in charts as of yet.

## 2024-06-10 NOTE — Telephone Encounter (Signed)
 PA was denied because plan only covers 84 tablets per 180 days

## 2024-06-30 DIAGNOSIS — M19049 Primary osteoarthritis, unspecified hand: Secondary | ICD-10-CM | POA: Diagnosis not present

## 2024-06-30 DIAGNOSIS — Z23 Encounter for immunization: Secondary | ICD-10-CM | POA: Diagnosis not present

## 2024-06-30 DIAGNOSIS — Z6826 Body mass index (BMI) 26.0-26.9, adult: Secondary | ICD-10-CM | POA: Diagnosis not present

## 2024-06-30 DIAGNOSIS — I10 Essential (primary) hypertension: Secondary | ICD-10-CM | POA: Diagnosis not present

## 2024-06-30 DIAGNOSIS — Z713 Dietary counseling and surveillance: Secondary | ICD-10-CM | POA: Diagnosis not present

## 2024-06-30 DIAGNOSIS — E7800 Pure hypercholesterolemia, unspecified: Secondary | ICD-10-CM | POA: Diagnosis not present
